# Patient Record
Sex: Male | Born: 1955 | Race: White | Hispanic: No | Marital: Married | Smoking: Current every day smoker
Health system: Southern US, Community
[De-identification: ages and names within clinical notes are randomized; demographics above are authoritative.]

## PROBLEM LIST (undated history)

## (undated) DIAGNOSIS — G8929 Other chronic pain: Secondary | ICD-10-CM

## (undated) DIAGNOSIS — I10 Essential (primary) hypertension: Secondary | ICD-10-CM

## (undated) DIAGNOSIS — J438 Other emphysema: Secondary | ICD-10-CM

## (undated) DIAGNOSIS — N528 Other male erectile dysfunction: Secondary | ICD-10-CM

## (undated) DIAGNOSIS — I700 Atherosclerosis of aorta: Secondary | ICD-10-CM

## (undated) DIAGNOSIS — E7800 Pure hypercholesterolemia, unspecified: Secondary | ICD-10-CM

## (undated) DIAGNOSIS — R7303 Prediabetes: Secondary | ICD-10-CM

## (undated) DIAGNOSIS — F17200 Nicotine dependence, unspecified, uncomplicated: Secondary | ICD-10-CM

## (undated) DIAGNOSIS — Z87448 Personal history of other diseases of urinary system: Secondary | ICD-10-CM

## (undated) HISTORY — PX: COLONOSCOPY: SHX174

## (undated) HISTORY — PX: TONSILLECTOMY: SUR1361

---

## 2011-07-29 ENCOUNTER — Ambulatory Visit: Payer: Self-pay | Admitting: Gastroenterology

## 2014-06-19 ENCOUNTER — Ambulatory Visit: Payer: Self-pay | Admitting: Family Medicine

## 2017-07-24 DIAGNOSIS — F17200 Nicotine dependence, unspecified, uncomplicated: Secondary | ICD-10-CM | POA: Insufficient documentation

## 2018-07-25 ENCOUNTER — Telehealth: Payer: Self-pay | Admitting: *Deleted

## 2018-07-25 DIAGNOSIS — R0989 Other specified symptoms and signs involving the circulatory and respiratory systems: Secondary | ICD-10-CM | POA: Insufficient documentation

## 2018-07-25 DIAGNOSIS — Z122 Encounter for screening for malignant neoplasm of respiratory organs: Secondary | ICD-10-CM

## 2018-07-25 DIAGNOSIS — Z87891 Personal history of nicotine dependence: Secondary | ICD-10-CM

## 2018-07-25 NOTE — Telephone Encounter (Signed)
Received referral for initial lung cancer screening scan. Contacted patient and obtained smoking history,(current, 86 pack year) as well as answering questions related to screening process. Patient denies signs of lung cancer such as weight loss or hemoptysis. Patient denies comorbidity that would prevent curative treatment if lung cancer were found. Patient is scheduled for shared decision making visit and CT scan on 08/14/18 at 2pm.

## 2018-07-26 ENCOUNTER — Other Ambulatory Visit: Payer: Self-pay | Admitting: Family Medicine

## 2018-07-26 DIAGNOSIS — F17200 Nicotine dependence, unspecified, uncomplicated: Secondary | ICD-10-CM

## 2018-08-02 ENCOUNTER — Ambulatory Visit
Admission: RE | Admit: 2018-08-02 | Discharge: 2018-08-02 | Disposition: A | Discharge status: Home / Self Care | Payer: BLUE CROSS/BLUE SHIELD | Source: Ambulatory Visit | Attending: Family Medicine | Admitting: Family Medicine

## 2018-08-02 DIAGNOSIS — F17200 Nicotine dependence, unspecified, uncomplicated: Secondary | ICD-10-CM

## 2018-08-13 ENCOUNTER — Telehealth: Payer: Self-pay | Admitting: *Deleted

## 2018-08-13 NOTE — Telephone Encounter (Signed)
Called pt to remind him of his appt for ldct screening on 08-14-2018@1400 , voiced understanding.

## 2018-08-14 ENCOUNTER — Encounter (INDEPENDENT_AMBULATORY_CARE_PROVIDER_SITE_OTHER): Payer: Self-pay

## 2018-08-14 ENCOUNTER — Ambulatory Visit
Admission: RE | Admit: 2018-08-14 | Discharge: 2018-08-14 | Disposition: A | Discharge status: Home / Self Care | Payer: BLUE CROSS/BLUE SHIELD | Source: Ambulatory Visit | Attending: Oncology | Admitting: Oncology

## 2018-08-14 ENCOUNTER — Inpatient Hospital Stay: Payer: BLUE CROSS/BLUE SHIELD | Attending: Oncology | Admitting: Oncology

## 2018-08-14 ENCOUNTER — Other Ambulatory Visit: Payer: Self-pay

## 2018-08-14 ENCOUNTER — Encounter: Payer: Self-pay | Admitting: Oncology

## 2018-08-14 DIAGNOSIS — Z87891 Personal history of nicotine dependence: Secondary | ICD-10-CM

## 2018-08-14 DIAGNOSIS — Z122 Encounter for screening for malignant neoplasm of respiratory organs: Secondary | ICD-10-CM | POA: Insufficient documentation

## 2018-08-14 NOTE — Progress Notes (Signed)
In accordance with CMS guidelines, patient has met eligibility criteria including age, absence of signs or symptoms of lung cancer.  Social History   Tobacco Use  . Smoking status: Current Every Day Smoker    Packs/day: 2.00    Years: 43.00    Pack years: 86.00    Types: Cigarettes  Substance Use Topics  . Alcohol use: Not on file  . Drug use: Not on file     A shared decision-making session was conducted prior to the performance of CT scan. This includes one or more decision aids, includes benefits and harms of screening, follow-up diagnostic testing, over-diagnosis, false positive rate, and total radiation exposure.  Counseling on the importance of adherence to annual lung cancer LDCT screening, impact of co-morbidities, and ability or willingness to undergo diagnosis and treatment is imperative for compliance of the program.  Counseling on the importance of continued smoking cessation for former smokers; the importance of smoking cessation for current smokers, and information about tobacco cessation interventions have been given to patient including [place] and [facility] quit [place] programs.  Written order for lung cancer screening with LDCT has been given to the patient and any and all questions have been answered to the best of my abilities.   Yearly follow up will be coordinated by Burgess Estelle, Thoracic Navigator.  Faythe Casa, NP 08/14/2018 2:49 PM

## 2018-08-15 ENCOUNTER — Telehealth: Payer: Self-pay | Admitting: *Deleted

## 2018-08-15 NOTE — Telephone Encounter (Signed)
Notified patient of LDCT lung cancer screening program results with recommendation for 12 month follow up imaging. Also notified of incidental findings noted below and is encouraged to discuss further with PCP who will receive a copy of this note and/or the CT report. Patient verbalizes understanding.   IMPRESSION: 1. Lung-RADS 2, benign appearance or behavior. Continue annual screening with low-dose chest CT without contrast in 12 months. 2. Ectatic 4.4 cm ascending thoracic aorta, which can be reassessed on follow-up screening chest CT in 12 months. 3. One vessel coronary atherosclerosis.  Aortic Atherosclerosis (ICD10-I70.0) and Emphysema (ICD10-J43.9).

## 2018-08-17 ENCOUNTER — Encounter: Payer: Self-pay | Admitting: *Deleted

## 2018-08-29 ENCOUNTER — Encounter: Payer: Self-pay | Admitting: *Deleted

## 2019-07-29 DIAGNOSIS — E7800 Pure hypercholesterolemia, unspecified: Secondary | ICD-10-CM | POA: Insufficient documentation

## 2019-07-29 DIAGNOSIS — R7303 Prediabetes: Secondary | ICD-10-CM | POA: Insufficient documentation

## 2019-08-15 ENCOUNTER — Telehealth: Payer: Self-pay | Admitting: *Deleted

## 2019-08-15 NOTE — Telephone Encounter (Signed)
(  08/15/19) Left message for pt to notify them that it is time to schedule annual low dose lung cancer screening CT scan. Instructed patient to call back to verify information prior to the scan being scheduled SRW     

## 2019-09-03 ENCOUNTER — Telehealth: Payer: Self-pay | Admitting: *Deleted

## 2019-09-03 DIAGNOSIS — Z87891 Personal history of nicotine dependence: Secondary | ICD-10-CM

## 2019-09-03 NOTE — Telephone Encounter (Signed)
(  09/03/19) Pt has been notified that lung cancer screening CT scan is due currently or will be in near future. Confirmed pt is within appropriate age range, and asymptomatic. Pt denies illness that would prevent curative treatment for lung cancer if found. Verified smoking history (Current Smoker, 2 ppd ). Pt is agreeable for CT scan being scheduled, and stated that Friday, April 2nd in the morning would be his preferred day to get the scan done on.  SRW

## 2019-09-05 NOTE — Addendum Note (Signed)
Addended by: Jonne Ply on: 09/05/2019 11:53 AM   Modules accepted: Orders

## 2019-09-05 NOTE — Telephone Encounter (Signed)
Smoking history: current, 88 pack year

## 2019-09-13 ENCOUNTER — Other Ambulatory Visit: Payer: Self-pay

## 2019-09-13 ENCOUNTER — Ambulatory Visit
Admission: RE | Admit: 2019-09-13 | Discharge: 2019-09-13 | Disposition: A | Discharge status: Home / Self Care | Payer: PRIVATE HEALTH INSURANCE | Source: Ambulatory Visit | Attending: Oncology | Admitting: Oncology

## 2019-09-13 DIAGNOSIS — Z87891 Personal history of nicotine dependence: Secondary | ICD-10-CM | POA: Insufficient documentation

## 2019-09-17 ENCOUNTER — Encounter: Payer: Self-pay | Admitting: *Deleted

## 2019-10-25 DIAGNOSIS — G8929 Other chronic pain: Secondary | ICD-10-CM | POA: Insufficient documentation

## 2020-01-29 DIAGNOSIS — I10 Essential (primary) hypertension: Secondary | ICD-10-CM | POA: Insufficient documentation

## 2020-04-13 ENCOUNTER — Encounter: Payer: Self-pay | Admitting: Orthopaedic Surgery

## 2020-04-13 ENCOUNTER — Ambulatory Visit: Payer: Self-pay

## 2020-04-13 ENCOUNTER — Ambulatory Visit: Payer: BC Managed Care – PPO | Admitting: Orthopaedic Surgery

## 2020-04-13 ENCOUNTER — Other Ambulatory Visit: Payer: Self-pay

## 2020-04-13 VITALS — Ht 70.0 in | Wt 168.4 lb

## 2020-04-13 DIAGNOSIS — G8929 Other chronic pain: Secondary | ICD-10-CM

## 2020-04-13 DIAGNOSIS — M25559 Pain in unspecified hip: Secondary | ICD-10-CM

## 2020-04-13 DIAGNOSIS — M25551 Pain in right hip: Secondary | ICD-10-CM

## 2020-04-13 DIAGNOSIS — M25572 Pain in left ankle and joints of left foot: Secondary | ICD-10-CM

## 2020-04-13 MED ORDER — METHYLPREDNISOLONE ACETATE 40 MG/ML IJ SUSP
40.0000 mg | INTRAMUSCULAR | Status: AC | PRN
  Administered 2020-04-13: 40 mg via INTRA_ARTICULAR

## 2020-04-13 MED ORDER — LIDOCAINE HCL 1 % IJ SOLN
3.0000 mL | INTRAMUSCULAR | Status: AC | PRN
  Administered 2020-04-13: 3 mL

## 2020-04-13 NOTE — Progress Notes (Signed)
Office Visit Note   Patient: Ryan Dixon           Date of Birth: July 27, 1955           MRN: [id number] Visit Date: 04/13/2020              Requested by: Marisue Ivan, MD [phone] [facility] [address] [facility] [place],  [place] [address] PCP: Marisue Ivan, MD   Assessment & Plan: Visit Diagnoses:  1. Pain in right hip   2. Hip pain   3. Pain in left ankle and joints of left foot     Plan: At this point given the failure of conservative treatment over a very long period of time, a MRI of the right hip was wanted an MRI of the left ankle.  This is to assess for tenderness and ligamentous tears over his trochanteric area on the right hip and his left medial ankle.  Since he has tried all of this conservative treatment with activity modification, therapy and injections and this has not helped, the neck step would be to obtain MRIs of both areas.  I did provide a trochanteric injection today on the right side and did show him stretching exercises to try.  All question concerns were answered and addressed.  Follow-Up Instructions: Return in about 4 weeks (around 05/11/2020).   Orders:  Orders Placed This Encounter  Procedures  . Large Joint Inj  . XR HIP UNILAT W OR W/O PELVIS 2-3 VIEWS RIGHT  . XR Ankle Complete Left   No orders of the defined types were placed in this encounter.     Procedures: Large Joint Inj: R greater trochanter on 04/13/2020 3:57 PM Indications: pain and diagnostic evaluation Details: 22 G 1.5 in needle, lateral approach  Arthrogram: No  Medications: 3 mL lidocaine 1 %; 40 mg methylPREDNISolone acetate 40 MG/ML Outcome: tolerated well, no immediate complications Procedure, treatment alternatives, risks and benefits explained, specific risks discussed. Consent was given by the patient. Immediately prior to procedure a time out was called to verify the correct patient, procedure, equipment, support staff and site/side marked as required. Patient  was prepped and draped in the usual sterile fashion.       Clinical Data: No additional findings.   Subjective: Chief Complaint  Patient presents with  . Right Hip - Pain  . Left Ankle - Pain  The patient is brand-new to me.  He comes in with chief chief complaints.  He has been dealing with right hip pain for many years now and left ankle pain.  He has been seen at [facility].  They have provided injections in his right hip joint hand and his left ankle.  The right hip is been hurting for about 4 to 5 years and the left leg was been hurting for about a year.  He feels like the left ankle may be due to how he walks different protecting his right hip.  He says injections of only temporize things and has not really helped quite a bit.  He is also been on oral prednisone and anti-inflammatories.  He is a very thin individual.  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  He climbs a lot of ladders and is down on his knees and crawling a lot.  He does have pain when he sleeps on the right hip at night.  He denies any groin pain on the right side.  He is not a diabetic and has no other acute medical  issues.  HPI  Review of Systems He currently denies any headache, chest pain, shortness of breath, fever, chills, nausea, vomiting  Objective: Vital Signs: Ht 5\' 10"  (1.778 m)   Wt 168 lb 6.4 oz (76.4 kg)   BMI 24.16 kg/m   Physical Exam He is alert and orient x3 and in no acute distress Ortho Exam Examination of his right hip shows it moves smoothly and fluidly.  There is a lot of pain over the trochanteric area of that right hip with abduction and when he squats down.  This does radiate into the IT band.  There is no sciatic component and the remainder of his right lower extremity exam is normal.  Examination of his left ankle does show pain anterior to the medial malleolus and into the ankle joint itself.  He has a lot of pain that radiates underneath his foot as well on the left side.   He is neurovascular intact with his left foot. Specialty Comments:  No specialty comments available.  Imaging: XR Ankle Complete Left  Result Date: 04/13/2020 3 views left ankle show some chronic spurring at the medial malleolus but a congruent ankle mortise.  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  XR HIP UNILAT W OR W/O PELVIS 2-3 VIEWS RIGHT  Result Date: 04/13/2020 An AP pelvis and lateral right hip shows no acute findings.  The hip joint space is well-maintained.    PMFS History: There are no problems to display for this patient.  History reviewed. No pertinent past medical history.  History reviewed. No pertinent family history.  History reviewed. No pertinent surgical history. Social History   Occupational History  . Not on file  Tobacco Use  . Smoking status: Current Every Day Smoker    Packs/day: 2.00    Years: 43.00    Pack years: 86.00    Types: Cigarettes  . Smokeless tobacco: Never Used  Substance and Sexual Activity  . Alcohol use: Yes  . Drug use: Not on file  . Sexual activity: Not on file

## 2020-04-14 ENCOUNTER — Other Ambulatory Visit: Payer: Self-pay | Admitting: Orthopaedic Surgery

## 2020-04-14 DIAGNOSIS — Z77018 Contact with and (suspected) exposure to other hazardous metals: Secondary | ICD-10-CM

## 2020-05-04 ENCOUNTER — Other Ambulatory Visit: Payer: Self-pay

## 2020-05-04 ENCOUNTER — Ambulatory Visit
Admission: RE | Admit: 2020-05-04 | Discharge: 2020-05-04 | Disposition: A | Discharge status: Home / Self Care | Payer: BC Managed Care – PPO | Source: Ambulatory Visit | Attending: Orthopaedic Surgery | Admitting: Orthopaedic Surgery

## 2020-05-04 DIAGNOSIS — G8929 Other chronic pain: Secondary | ICD-10-CM

## 2020-05-04 DIAGNOSIS — M25551 Pain in right hip: Secondary | ICD-10-CM

## 2020-05-04 DIAGNOSIS — Z77018 Contact with and (suspected) exposure to other hazardous metals: Secondary | ICD-10-CM

## 2020-05-04 DIAGNOSIS — M25572 Pain in left ankle and joints of left foot: Secondary | ICD-10-CM

## 2020-05-11 ENCOUNTER — Ambulatory Visit: Payer: BC Managed Care – PPO | Admitting: Orthopaedic Surgery

## 2020-05-18 ENCOUNTER — Ambulatory Visit: Payer: BC Managed Care – PPO | Admitting: Orthopaedic Surgery

## 2020-05-18 ENCOUNTER — Encounter: Payer: Self-pay | Admitting: Orthopaedic Surgery

## 2020-05-18 DIAGNOSIS — G8929 Other chronic pain: Secondary | ICD-10-CM | POA: Diagnosis not present

## 2020-05-18 DIAGNOSIS — M25551 Pain in right hip: Secondary | ICD-10-CM

## 2020-05-18 DIAGNOSIS — M25572 Pain in left ankle and joints of left foot: Secondary | ICD-10-CM | POA: Diagnosis not present

## 2020-05-18 NOTE — Progress Notes (Signed)
The patient comes in today to go over MRIs of both his right hip and his left ankle.  The right hip is hurting worse and has been hurting more on the lateral aspect of his hip.  He denies any groin pain at all.  He has had an intra-articular injection in the right hip as well as trochanteric injection with steroid.  He is also had some deep left ankle pain.  Both of these were chronic and he has been through enough rest and time and conservative treatment as well as anti-inflammatories to 1 MRIs of these areas.  On my exam today his left ankle does hurt throughout his arc of motion.  There is a lot of pain medially as well.  The right hip moves smoothly and fluidly with no pain in the groin and no blocks to rotation.  There is pain over the trochanteric area.  The MRI of his right hip shows a degenerative labral tear the superior and anterior labrum but no cartilage defect in the hip ball or socket at all.  There is no muscle tears or tendon tears and no fluid collection over the trochanteric area.  The MRI of the left ankle does show tendinitis of the posterior tibial tendon and the FHL tendon.  There is also an osteochondral defect at the medial talar dome that is nonfragmented.  From a hip standpoint, I would like to send him to outpatient physical therapy because he has not had this for his hip.  From the standpoint of his left ankle.  I would like to send him to Dr. Lajoyce Corners for further evaluation of this to consider whether or not an arthroscopic intervention is warranted for that ankle.  I will defer treatment to the ankle to Dr. Lajoyce Corners.  For the right hip, I would like to see him back in 6 weeks.  All questions and concerns were answered and addressed.

## 2020-05-20 ENCOUNTER — Encounter: Payer: Self-pay | Admitting: Family

## 2020-05-20 ENCOUNTER — Ambulatory Visit: Payer: BC Managed Care – PPO | Admitting: Family

## 2020-05-20 DIAGNOSIS — M25572 Pain in left ankle and joints of left foot: Secondary | ICD-10-CM

## 2020-05-20 DIAGNOSIS — G8929 Other chronic pain: Secondary | ICD-10-CM | POA: Diagnosis not present

## 2020-05-20 DIAGNOSIS — M25872 Other specified joint disorders, left ankle and foot: Secondary | ICD-10-CM | POA: Diagnosis not present

## 2020-05-20 DIAGNOSIS — M76822 Posterior tibial tendinitis, left leg: Secondary | ICD-10-CM | POA: Diagnosis not present

## 2020-05-20 NOTE — Progress Notes (Signed)
Office Visit Note   Patient: Ryan Dixon           Date of Birth: 1955/11/27           MRN: [id number] Visit Date: 05/20/2020              Requested by: Marisue Ivan, MD [phone] [facility] [address] [facility] [place],  [place] [address] PCP: Marisue Ivan, MD  Chief Complaint  Patient presents with  . Left Ankle - Pain      HPI: Patient is a 64 year old gentleman who presents with over 1 year history of left ankle pain.  Patient states he has pain with start up and ambulation over the anterior aspect of his ankle he states he has had minimal relief with a steroid injection in the past.  Patient states that after prolonged ambulation he has pain over the posterior tibial tendon.  Patient is status post MRI scan.  Assessment & Plan: Visit Diagnoses: No diagnosis found.  Plan: Patient was given instructions and demonstrated fascial strengthening for his posterior tibial tendinitis.  Discussed that we could try a brace if this does not help.  Discussed that arthroscopic intervention is an option for the osteochondral defect in the impingement of the left ankle risks and benefits were discussed including infection persistent pain need for additional surgery.  Patient states he understands wished to proceed with outpatient arthroscopic debridement of his left ankle.  Follow-Up Instructions: Return if symptoms worsen or fail to improve.   Ortho Exam  Patient is alert, oriented, no adenopathy, well-dressed, normal affect, normal respiratory effort. Examination patient has a good pulse he can do a single limb heel raise but he does have some tenderness to palpation of the posterior tibial tendon he is tender to palpation anteriorly over the ankle there is no effusion there is good range of motion of the ankle and subtalar joint.  MRI scan shows inflammation of the posterior tibial tendon as well as osteochondral defect of the ankle.  Imaging: No results found. No images are  attached to the encounter.  Labs: No results found for: HGBA1C, ESRSEDRATE, CRP, LABURIC, REPTSTATUS, GRAMSTAIN, CULT, LABORGA   No results found for: ALBUMIN, PREALBUMIN, LABURIC  No results found for: MG No results found for: VD25OH  No results found for: PREALBUMIN No flowsheet data found.   There is no height or weight on file to calculate BMI.  Orders:  No orders of the defined types were placed in this encounter.  No orders of the defined types were placed in this encounter.    Procedures: No procedures performed  Clinical Data: No additional findings.  ROS:  All other systems negative, except as noted in the HPI. Review of Systems  Objective: Vital Signs: There were no vitals taken for this visit.  Specialty Comments:  No specialty comments available.  PMFS History: There are no problems to display for this patient.  History reviewed. No pertinent past medical history.  History reviewed. No pertinent family history.  History reviewed. No pertinent surgical history. Social History   Occupational History  . Not on file  Tobacco Use  . Smoking status: Current Every Day Smoker    Packs/day: 2.00    Years: 43.00    Pack years: 86.00    Types: Cigarettes  . Smokeless tobacco: Never Used  Substance and Sexual Activity  . Alcohol use: Yes  . Drug use: Not on file  . Sexual activity: Not on file

## 2020-06-29 ENCOUNTER — Ambulatory Visit: Payer: BC Managed Care – PPO | Admitting: Orthopaedic Surgery

## 2020-07-27 ENCOUNTER — Other Ambulatory Visit: Payer: Self-pay | Admitting: Physician Assistant

## 2020-07-30 ENCOUNTER — Other Ambulatory Visit: Payer: Self-pay

## 2020-08-04 ENCOUNTER — Other Ambulatory Visit: Payer: Self-pay

## 2020-08-04 ENCOUNTER — Encounter (HOSPITAL_BASED_OUTPATIENT_CLINIC_OR_DEPARTMENT_OTHER): Payer: Self-pay | Admitting: Orthopedic Surgery

## 2020-08-07 ENCOUNTER — Other Ambulatory Visit (HOSPITAL_COMMUNITY)
Admission: RE | Admit: 2020-08-07 | Discharge: 2020-08-07 | Disposition: A | Discharge status: Home / Self Care | Payer: BC Managed Care – PPO | Source: Ambulatory Visit | Attending: Orthopedic Surgery | Admitting: Orthopedic Surgery

## 2020-08-07 ENCOUNTER — Encounter (HOSPITAL_BASED_OUTPATIENT_CLINIC_OR_DEPARTMENT_OTHER)
Admission: RE | Admit: 2020-08-07 | Discharge: 2020-08-07 | Disposition: A | Discharge status: Home / Self Care | Payer: BC Managed Care – PPO | Source: Ambulatory Visit | Attending: Orthopedic Surgery | Admitting: Orthopedic Surgery

## 2020-08-07 DIAGNOSIS — Z01812 Encounter for preprocedural laboratory examination: Secondary | ICD-10-CM | POA: Diagnosis not present

## 2020-08-07 DIAGNOSIS — I10 Essential (primary) hypertension: Secondary | ICD-10-CM | POA: Diagnosis not present

## 2020-08-07 DIAGNOSIS — Z20822 Contact with and (suspected) exposure to covid-19: Secondary | ICD-10-CM | POA: Insufficient documentation

## 2020-08-07 DIAGNOSIS — Z01818 Encounter for other preprocedural examination: Secondary | ICD-10-CM | POA: Diagnosis not present

## 2020-08-07 LAB — BASIC METABOLIC PANEL
Anion gap: 11 (ref 5–15)
BUN: 22 mg/dL (ref 8–23)
CO2: 28 mmol/L (ref 22–32)
Calcium: 9 mg/dL (ref 8.9–10.3)
Chloride: 100 mmol/L (ref 98–111)
Creatinine, Ser: 1.24 mg/dL (ref 0.61–1.24)
GFR, Estimated: >60 mL/min (ref >60)
Glucose, Bld: 106 mg/dL — ABNORMAL HIGH (ref 70–99)
Potassium: 4.1 mmol/L (ref 3.5–5.1)
Sodium: 139 mmol/L (ref 135–145)

## 2020-08-07 LAB — SARS CORONAVIRUS 2 (TAT 6-24 HRS): SARS Coronavirus 2: NEGATIVE

## 2020-08-07 NOTE — Progress Notes (Signed)

## 2020-08-10 NOTE — Anesthesia Preprocedure Evaluation (Addendum)
Anesthesia Evaluation  Patient identified by MRN, date of birth, ID band Patient awake    Reviewed: Allergy & Precautions, NPO status , Patient's Chart, lab work & pertinent test results  Airway Mallampati: II  TM Distance: >3 FB Neck ROM: Full    Dental no notable dental hx. (+) Partial Upper, Partial Lower, Dental Advisory Given   Pulmonary Current SmokerPatient did not abstain from smoking.,    Pulmonary exam normal breath sounds clear to auscultation       Cardiovascular Exercise Tolerance: Good hypertension, Pt. on medications Normal cardiovascular exam Rhythm:Regular Rate:Normal     Neuro/Psych negative neurological ROS  negative psych ROS   GI/Hepatic negative GI ROS, Neg liver ROS,   Endo/Other  negative endocrine ROS  Renal/GU negative Renal ROS     Musculoskeletal negative musculoskeletal ROS (+)   Abdominal   Peds  Hematology negative hematology ROS (+)   Anesthesia Other Findings   Reproductive/Obstetrics                            Anesthesia Physical Anesthesia Plan  ASA: II  Anesthesia Plan: General   Post-op Pain Management:    Induction: Intravenous  PONV Risk Score and Plan: 2 and Treatment may vary due to age or medical condition  Airway Management Planned: LMA  Additional Equipment: None  Intra-op Plan:   Post-operative Plan:   Informed Consent:     Dental advisory given  Plan Discussed with: CRNA and Anesthesiologist  Anesthesia Plan Comments: (GA )       Anesthesia Quick Evaluation

## 2020-08-11 ENCOUNTER — Ambulatory Visit (HOSPITAL_BASED_OUTPATIENT_CLINIC_OR_DEPARTMENT_OTHER): Payer: BC Managed Care – PPO | Admitting: Anesthesiology

## 2020-08-11 ENCOUNTER — Encounter (HOSPITAL_BASED_OUTPATIENT_CLINIC_OR_DEPARTMENT_OTHER): Payer: Self-pay | Admitting: Orthopedic Surgery

## 2020-08-11 ENCOUNTER — Other Ambulatory Visit: Payer: Self-pay

## 2020-08-11 ENCOUNTER — Ambulatory Visit (HOSPITAL_BASED_OUTPATIENT_CLINIC_OR_DEPARTMENT_OTHER)
Admission: RE | Admit: 2020-08-11 | Discharge: 2020-08-11 | Disposition: A | Discharge status: Home / Self Care | Payer: BC Managed Care – PPO | Attending: Orthopedic Surgery | Admitting: Orthopedic Surgery

## 2020-08-11 ENCOUNTER — Encounter (HOSPITAL_BASED_OUTPATIENT_CLINIC_OR_DEPARTMENT_OTHER)
Admission: RE | Disposition: A | Discharge status: Home / Self Care | Payer: Self-pay | Source: Home / Self Care | Attending: Orthopedic Surgery

## 2020-08-11 DIAGNOSIS — Z886 Allergy status to analgesic agent status: Secondary | ICD-10-CM | POA: Insufficient documentation

## 2020-08-11 DIAGNOSIS — M659 Synovitis and tenosynovitis, unspecified: Secondary | ICD-10-CM | POA: Insufficient documentation

## 2020-08-11 DIAGNOSIS — Z79899 Other long term (current) drug therapy: Secondary | ICD-10-CM | POA: Insufficient documentation

## 2020-08-11 DIAGNOSIS — M25872 Other specified joint disorders, left ankle and foot: Secondary | ICD-10-CM | POA: Diagnosis present

## 2020-08-11 DIAGNOSIS — Z791 Long term (current) use of non-steroidal anti-inflammatories (NSAID): Secondary | ICD-10-CM | POA: Insufficient documentation

## 2020-08-11 DIAGNOSIS — F17210 Nicotine dependence, cigarettes, uncomplicated: Secondary | ICD-10-CM | POA: Diagnosis not present

## 2020-08-11 HISTORY — PX: ANKLE ARTHROSCOPY: SHX545

## 2020-08-11 HISTORY — DX: Essential (primary) hypertension: I10

## 2020-08-11 SURGERY — ARTHROSCOPY, ANKLE
Anesthesia: General | Site: Ankle | Laterality: Left

## 2020-08-11 MED ORDER — OXYCODONE HCL 5 MG PO TABS: 5.0000 mg | ORAL_TABLET | Freq: Once | ORAL | Status: DC | PRN

## 2020-08-11 MED ORDER — ONDANSETRON HCL 4 MG/2ML IJ SOLN
INTRAMUSCULAR | Status: AC
  Filled 2020-08-11: qty 2

## 2020-08-11 MED ORDER — FENTANYL CITRATE (PF) 100 MCG/2ML IJ SOLN
INTRAMUSCULAR | Status: AC
  Filled 2020-08-11: qty 2

## 2020-08-11 MED ORDER — PROPOFOL 10 MG/ML IV BOLUS
INTRAVENOUS | Status: AC
  Filled 2020-08-11: qty 40

## 2020-08-11 MED ORDER — ONDANSETRON HCL 4 MG/2ML IJ SOLN
INTRAMUSCULAR | Status: DC | PRN
  Administered 2020-08-11: 4 mg via INTRAVENOUS

## 2020-08-11 MED ORDER — BUPIVACAINE HCL (PF) 0.25 % IJ SOLN
INTRAMUSCULAR | Status: AC
  Filled 2020-08-11: qty 30

## 2020-08-11 MED ORDER — PROPOFOL 10 MG/ML IV BOLUS
INTRAVENOUS | Status: DC | PRN
  Administered 2020-08-11: 120 mg via INTRAVENOUS
  Administered 2020-08-11: 20 mg via INTRAVENOUS
  Administered 2020-08-11: 60 mg via INTRAVENOUS

## 2020-08-11 MED ORDER — CEFAZOLIN SODIUM-DEXTROSE 2-4 GM/100ML-% IV SOLN
INTRAVENOUS | Status: AC
  Filled 2020-08-11: qty 100

## 2020-08-11 MED ORDER — OXYCODONE HCL 5 MG/5ML PO SOLN: 5.0000 mg | Freq: Once | ORAL | Status: DC | PRN

## 2020-08-11 MED ORDER — MIDAZOLAM HCL 5 MG/5ML IJ SOLN
INTRAMUSCULAR | Status: DC | PRN
  Administered 2020-08-11: 2 mg via INTRAVENOUS

## 2020-08-11 MED ORDER — FENTANYL CITRATE (PF) 100 MCG/2ML IJ SOLN
INTRAMUSCULAR | Status: DC | PRN
  Administered 2020-08-11: 50 ug via INTRAVENOUS
  Administered 2020-08-11 (×2): 25 ug via INTRAVENOUS
  Administered 2020-08-11: 50 ug via INTRAVENOUS

## 2020-08-11 MED ORDER — ACETAMINOPHEN 10 MG/ML IV SOLN: 1000.0000 mg | Freq: Once | INTRAVENOUS | Status: DC | PRN

## 2020-08-11 MED ORDER — AMISULPRIDE (ANTIEMETIC) 5 MG/2ML IV SOLN: 10.0000 mg | Freq: Once | INTRAVENOUS | Status: DC | PRN

## 2020-08-11 MED ORDER — DEXAMETHASONE SODIUM PHOSPHATE 4 MG/ML IJ SOLN
INTRAMUSCULAR | Status: DC | PRN
  Administered 2020-08-11: 8 mg via INTRAVENOUS

## 2020-08-11 MED ORDER — MIDAZOLAM HCL 2 MG/2ML IJ SOLN
INTRAMUSCULAR | Status: AC
  Filled 2020-08-11: qty 2

## 2020-08-11 MED ORDER — PROPOFOL 10 MG/ML IV BOLUS
INTRAVENOUS | Status: AC
  Filled 2020-08-11: qty 20

## 2020-08-11 MED ORDER — LACTATED RINGERS IV SOLN: INTRAVENOUS | Status: DC

## 2020-08-11 MED ORDER — DEXAMETHASONE SODIUM PHOSPHATE 10 MG/ML IJ SOLN
INTRAMUSCULAR | Status: AC
  Filled 2020-08-11: qty 1

## 2020-08-11 MED ORDER — HYDROCODONE-ACETAMINOPHEN 5-325 MG PO TABS: 1.0000 | ORAL_TABLET | ORAL | 0 refills | Status: DC | PRN

## 2020-08-11 MED ORDER — HYDROMORPHONE HCL 1 MG/ML IJ SOLN: 0.2500 mg | INTRAMUSCULAR | Status: DC | PRN

## 2020-08-11 MED ORDER — CEFAZOLIN SODIUM-DEXTROSE 2-4 GM/100ML-% IV SOLN
2.0000 g | INTRAVENOUS | Status: AC
  Administered 2020-08-11: 2 g via INTRAVENOUS

## 2020-08-11 MED ORDER — BUPIVACAINE HCL (PF) 0.25 % IJ SOLN
INTRAMUSCULAR | Status: DC | PRN
  Administered 2020-08-11: 15 mL

## 2020-08-11 MED ORDER — LIDOCAINE 2% (20 MG/ML) 5 ML SYRINGE
INTRAMUSCULAR | Status: DC | PRN
  Administered 2020-08-11: 100 mg via INTRAVENOUS

## 2020-08-11 MED ORDER — ONDANSETRON HCL 4 MG/2ML IJ SOLN: 4.0000 mg | Freq: Once | INTRAMUSCULAR | Status: DC | PRN

## 2020-08-11 SURGICAL SUPPLY — 34 items
BNDG CMPR 9X4 STRL LF SNTH (GAUZE/BANDAGES/DRESSINGS)
BNDG COHESIVE 4X5 TAN STRL (GAUZE/BANDAGES/DRESSINGS) IMPLANT
BNDG COHESIVE 6X5 TAN STRL LF (GAUZE/BANDAGES/DRESSINGS) ×2 IMPLANT
BNDG ESMARK 4X9 LF (GAUZE/BANDAGES/DRESSINGS) IMPLANT
BNDG GAUZE ELAST 4 BULKY (GAUZE/BANDAGES/DRESSINGS) IMPLANT
COVER WAND RF STERILE (DRAPES) IMPLANT
DISSECTOR 4.0MM X 13CM (MISCELLANEOUS) ×2 IMPLANT
DRAPE ARTHROSCOPY W/POUCH 90 (DRAPES) ×2 IMPLANT
DRAPE OEC MINIVIEW 54X84 (DRAPES) IMPLANT
DRAPE U-SHAPE 47X51 STRL (DRAPES) ×2 IMPLANT
DRSG EMULSION OIL 3X3 NADH (GAUZE/BANDAGES/DRESSINGS) ×2 IMPLANT
DURAPREP 26ML APPLICATOR (WOUND CARE) ×2 IMPLANT
EXCALIBUR 3.8MM X 13CM (MISCELLANEOUS) ×2 IMPLANT
GAUZE SPONGE 4X4 12PLY STRL (GAUZE/BANDAGES/DRESSINGS) ×2 IMPLANT
GLOVE SURG ENC MOIS LTX SZ7 (GLOVE) ×4 IMPLANT
GLOVE SURG ORTHO LTX SZ9 (GLOVE) ×2 IMPLANT
GLOVE SURG UNDER POLY LF SZ7.5 (GLOVE) ×4 IMPLANT
GLOVE SURG UNDER POLY LF SZ9 (GLOVE) ×2 IMPLANT
GOWN STRL REUS W/ TWL LRG LVL3 (GOWN DISPOSABLE) ×1 IMPLANT
GOWN STRL REUS W/ TWL XL LVL3 (GOWN DISPOSABLE) ×1 IMPLANT
GOWN STRL REUS W/TWL LRG LVL3 (GOWN DISPOSABLE) ×2
GOWN STRL REUS W/TWL XL LVL3 (GOWN DISPOSABLE) ×2
MANIFOLD NEPTUNE II (INSTRUMENTS) IMPLANT
PACK ARTHROSCOPY DSU (CUSTOM PROCEDURE TRAY) ×2 IMPLANT
PACK BASIN DAY SURGERY FS (CUSTOM PROCEDURE TRAY) ×2 IMPLANT
PORT APPOLLO RF 90DEGREE MULTI (SURGICAL WAND) IMPLANT
PROBE APOLLO 90XL (SURGICAL WAND) ×2 IMPLANT
SLEEVE SCD COMPRESS KNEE MED (STOCKING) IMPLANT
STRAP ANKLE FOOT DISTRACTOR (ORTHOPEDIC SUPPLIES) ×2 IMPLANT
SUT ETHILON 2 0 FSLX (SUTURE) ×2 IMPLANT
SYR 5ML LL (SYRINGE) IMPLANT
TOWEL GREEN STERILE FF (TOWEL DISPOSABLE) ×2 IMPLANT
TUBING ARTHROSCOPY IRRIG 16FT (MISCELLANEOUS) ×2 IMPLANT
WATER STERILE IRR 1000ML POUR (IV SOLUTION) ×2 IMPLANT

## 2020-08-11 NOTE — Op Note (Signed)
08/11/2020  8:07 AM  PATIENT:  Ryan Dixon    PRE-OPERATIVE DIAGNOSIS:  Impingement Left Ankle  POST-OPERATIVE DIAGNOSIS:  Same  PROCEDURE:  LEFT ANKLE ARTHROSCOPY AND DEBRIDEMENT  SURGEON:  Nadara Mustard, MD  PHYSICIAN ASSISTANT:None ANESTHESIA:   General  PREOPERATIVE INDICATIONS:  [name] is a  65 y.o. male with a diagnosis of Impingement Left Ankle who failed conservative measures and elected for surgical management.    The risks benefits and alternatives were discussed with the patient preoperatively including but not limited to the risks of infection, bleeding, nerve injury, cardiopulmonary complications, the need for revision surgery, among others, and the patient was willing to proceed.  OPERATIVE IMPLANTS: None  @ENCIMAGES @  OPERATIVE FINDINGS: Inflammatory cellulitis resected articular cartilage without osteochondral defect  OPERATIVE PROCEDURE: Patient brought the operating room and underwent a general anesthetic.  After adequate levels anesthesia were obtained patient's left lower extremity was prepped using DuraPrep draped into a sterile field a timeout was called.  The scope was inserted to the anterior medial portal and an anterior lateral working portal was established.  The skin was incised blunt dissection was carried down to the capsule and a blunt trocar was used inserted into the joint.  Visualization showed bleeding with inflammatory synovitis.  The inflammatory synovitis was resected electrocautery was used for hemostasis.  The tibial talar articular cartilage was probed there was no palpable osteochondral defects.  The medial and lateral gutters were cleansed.  Survey of all surfaces was again performed there were no loose bodies no impingement.  The instruments were removed the portals were closed using 2-0 nylon the joint was infused with 15 cc of quarter percent Marcaine plain a sterile dressing was applied patient was extubated taken the PACU in stable  condition   DISCHARGE PLANNING:  Antibiotic duration: Preoperative antibiotics  Weightbearing: Weightbearing as tolerated  Pain medication: Prescription for Vicodin  Dressing care/ Wound VAC: Discontinue dressing in 2 days  Ambulatory devices: Crutches  Discharge to: Home.  Follow-up: In the office 1 week post operative.

## 2020-08-11 NOTE — Anesthesia Postprocedure Evaluation (Signed)
Anesthesia Post Note  Patient: Ryan Dixon  Procedure(s) Performed: LEFT ANKLE ARTHROSCOPY AND DEBRIDEMENT (Left Ankle)     Patient location during evaluation: PACU Anesthesia Type: General Level of consciousness: awake and alert Pain management: pain level controlled Vital Signs Assessment: post-procedure vital signs reviewed and stable Respiratory status: spontaneous breathing, nonlabored ventilation, respiratory function stable and patient connected to nasal cannula oxygen Cardiovascular status: blood pressure returned to baseline and stable Postop Assessment: no apparent nausea or vomiting Anesthetic complications: no   No complications documented.  Last Vitals:  Vitals:   08/11/20 0830 08/11/20 0843  BP: (!) 152/98 (!) 154/89  Pulse: 75 76  Resp: 19 20  Temp:  36.4 C  SpO2: 99% 100%    Last Pain:  Vitals:   08/11/20 0843  TempSrc: Oral  PainSc: 0-No pain                 Trevor Iha

## 2020-08-11 NOTE — Anesthesia Procedure Notes (Signed)
Procedure Name: LMA Insertion Date/Time: 08/11/2020 7:33 AM Performed by: Alford Highland, CRNA Pre-anesthesia Checklist: Patient identified, Emergency Drugs available, Suction available and Patient being monitored Patient Re-evaluated:Patient Re-evaluated prior to induction Oxygen Delivery Method: Circle System Utilized Preoxygenation: Pre-oxygenation with 100% oxygen Induction Type: IV induction Ventilation: Mask ventilation without difficulty LMA: LMA inserted LMA Size: 5.0 Number of attempts: 1 Airway Equipment and Method: Bite block Placement Confirmation: positive ETCO2 Tube secured with: Tape Dental Injury: Teeth and Oropharynx as per pre-operative assessment  Comments: Eyes taped prior to insertion

## 2020-08-11 NOTE — Discharge Instructions (Signed)

## 2020-08-11 NOTE — Transfer of Care (Signed)
Immediate Anesthesia Transfer of Care Note  Patient: Ryan Dixon  Procedure(s) Performed: LEFT ANKLE ARTHROSCOPY AND DEBRIDEMENT (Left Ankle)  Patient Location: PACU  Anesthesia Type:General  Level of Consciousness: drowsy  Airway & Oxygen Therapy: Patient Spontanous Breathing and Patient connected to face mask oxygen  Post-op Assessment: Report given to RN and Post -op Vital signs reviewed and stable  Post vital signs: Reviewed and stable  Last Vitals:  Vitals Value Taken Time  BP    Temp    Pulse 81 08/11/20 0811  Resp 0 08/11/20 0811  SpO2 100 % 08/11/20 0811  Vitals shown include unvalidated device data.  Last Pain:  Vitals:   08/11/20 0637  TempSrc: Oral  PainSc: 3       Patients Stated Pain Goal: 2 (08/11/20 3295)  Complications: No complications documented.

## 2020-08-11 NOTE — H&P (Signed)
Ryan Dixon is an 65 y.o. male.   Chief Complaint: Left Ankle Impingement HPI: Patient is a 65 year old gentleman who presents with over 1 year history of left ankle pain.  Patient states he has pain with start up and ambulation over the anterior aspect of his ankle he states he has had minimal relief with a steroid injection in the past.  Patient states that after prolonged ambulation he has pain over the posterior tibial tendon.  Patient is status post MRI scan.  Past Medical History:  Diagnosis Date  . Hypertension     Past Surgical History:  Procedure Laterality Date  . TONSILLECTOMY      History reviewed. No pertinent family history. Social History:  reports that he has been smoking cigarettes. He has a 86.00 pack-year smoking history. He has never used smokeless tobacco. He reports current alcohol use. He reports that he does not use drugs.  Allergies:  Allergies  Allergen Reactions  . Other Other (See Comments)    Anti-inflammatories cause GI upset, N/V    Medications Prior to Admission  Medication Sig Dispense Refill  . hydrochlorothiazide (HYDRODIURIL) 12.5 MG tablet Take 12.5 mg by mouth daily.    . meloxicam (MOBIC) 7.5 MG tablet Take 7.5 mg by mouth daily.      No results found for this or any previous visit (from the past 48 hour(s)). No results found.  Review of Systems  All other systems reviewed and are negative.   Height 5\' 10"  (1.778 m), weight 77.1 kg. Physical Exam   Patient is alert, oriented, no adenopathy, well-dressed, normal affect, normal respiratory effort. Examination patient has a good pulse he can do a single limb heel raise but he does have some tenderness to palpation of the posterior tibial tendon he is tender to palpation anteriorly over the ankle there is no effusion there is good range of motion of the ankle and subtalar joint.  MRI scan shows inflammation of the posterior tibial tendon as well as osteochondral defect of the ankle. RRR  Lungs Clear Assessment/Plan Plan: Patient was given instructions and demonstrated fascial strengthening for his posterior tibial tendinitis.  Discussed that we could try a brace if this does not help.  Discussed that arthroscopic intervention is an option for the osteochondral defect in the impingement of the left ankle risks and benefits were discussed including infection persistent pain need for additional surgery.  Patient states he understands wished to proceed with outpatient arthroscopic debridement of his left ankle.  Dagon Budai, PA 08/11/2020, 6:38 AM

## 2020-08-11 NOTE — Interval H&P Note (Signed)
History and Physical Interval Note:  08/11/2020 6:45 AM  Ryan Dixon  has presented today for surgery, with the diagnosis of Impingement Left Ankle.  The various methods of treatment have been discussed with the patient and family. After consideration of risks, benefits and other options for treatment, the patient has consented to  Procedure(s): LEFT ANKLE ARTHROSCOPY AND DEBRIDEMENT (Left) as a surgical intervention.  The patient's history has been reviewed, patient examined, no change in status, stable for surgery.  I have reviewed the patient's chart and labs.  Questions were answered to the patient's satisfaction.     Nadara Mustard

## 2020-08-12 ENCOUNTER — Encounter (HOSPITAL_BASED_OUTPATIENT_CLINIC_OR_DEPARTMENT_OTHER): Payer: Self-pay | Admitting: Orthopedic Surgery

## 2020-08-18 ENCOUNTER — Inpatient Hospital Stay: Payer: BC Managed Care – PPO | Admitting: Physician Assistant

## 2020-08-18 ENCOUNTER — Ambulatory Visit (INDEPENDENT_AMBULATORY_CARE_PROVIDER_SITE_OTHER): Payer: BC Managed Care – PPO | Admitting: Family

## 2020-08-18 ENCOUNTER — Encounter: Payer: Self-pay | Admitting: Family

## 2020-08-18 DIAGNOSIS — M25872 Other specified joint disorders, left ankle and foot: Secondary | ICD-10-CM

## 2020-08-18 NOTE — Progress Notes (Signed)
   Post-Op Visit Note   Patient: Ryan Dixon           Date of Birth: February 27, 1956           MRN: [id number] Visit Date: 08/18/2020 PCP: Marisue Ivan, MD  Chief Complaint:  Chief Complaint  Patient presents with  . Left Ankle - Routine Post Op    08/11/20 left ankle scope and debridement     HPI:  HPI The patient is a 65 year old gentleman seen status post left ankle arthroscopy and debridement on March 1 he is full weightbearing in a postop shoe has no concerns today.  He is pleased with his improvement in pain  Ortho Exam  The portals are clean and dry his sutures were harvested today without incident.  There is no erythema no edema of his foot or ankle  Visit Diagnoses: No diagnosis found.  Plan: He will advance his weightbearing as tolerated.  Keep the portals clean and dry  Follow-Up Instructions: Return in about 3 weeks (around 09/08/2020), or if symptoms worsen or fail to improve.   Imaging: No results found.  Orders:  No orders of the defined types were placed in this encounter.  No orders of the defined types were placed in this encounter.    PMFS History: Patient Active Problem List   Diagnosis Date Noted  . Ankle impingement syndrome, left    Past Medical History:  Diagnosis Date  . Hypertension     History reviewed. No pertinent family history.  Past Surgical History:  Procedure Laterality Date  . ANKLE ARTHROSCOPY Left 08/11/2020   Procedure: LEFT ANKLE ARTHROSCOPY AND DEBRIDEMENT;  Surgeon: Nadara Mustard, MD;  Location:  SUR[facility]ERY [facility];  Service: Orthopedics;  Laterality: Left;  . TONSILLECTOMY     Social History   Occupational History  . Not on file  Tobacco Use  . Smoking status: Current Every Day Smoker    Packs/day: 2.00    Years: 43.00    Pack years: 86.00    Types: Cigarettes  . Smokeless tobacco: Never Used  Substance and Sexual Activity  . Alcohol use: Yes    Comment: 1-2 beers/day  . Drug use: Never  . Sexual  activity: Not on file

## 2020-08-23 ENCOUNTER — Telehealth: Payer: Self-pay | Admitting: *Deleted

## 2020-08-23 NOTE — Telephone Encounter (Signed)
Notified patient that it is time to schedule his lung cancer screening CT scan. Patient currently smokes 2 packs of cigarettes per day. Appointment scheduled for 09/16/2020 at 11:30 am.

## 2020-08-24 ENCOUNTER — Other Ambulatory Visit: Payer: Self-pay | Admitting: *Deleted

## 2020-08-24 DIAGNOSIS — F17200 Nicotine dependence, unspecified, uncomplicated: Secondary | ICD-10-CM

## 2020-08-24 DIAGNOSIS — Z122 Encounter for screening for malignant neoplasm of respiratory organs: Secondary | ICD-10-CM

## 2020-08-24 DIAGNOSIS — Z87891 Personal history of nicotine dependence: Secondary | ICD-10-CM

## 2020-08-24 NOTE — Progress Notes (Signed)
Contacted and scheduled for annual lung screening scan. Patient is a current smoker with a 90 pack year history.

## 2020-09-16 ENCOUNTER — Other Ambulatory Visit: Payer: Self-pay

## 2020-09-16 ENCOUNTER — Ambulatory Visit
Admission: RE | Admit: 2020-09-16 | Discharge: 2020-09-16 | Disposition: A | Discharge status: Home / Self Care | Payer: BC Managed Care – PPO | Source: Ambulatory Visit | Attending: Oncology | Admitting: Oncology

## 2020-09-16 DIAGNOSIS — F17200 Nicotine dependence, unspecified, uncomplicated: Secondary | ICD-10-CM | POA: Insufficient documentation

## 2020-09-16 DIAGNOSIS — Z87891 Personal history of nicotine dependence: Secondary | ICD-10-CM | POA: Insufficient documentation

## 2020-09-16 DIAGNOSIS — Z122 Encounter for screening for malignant neoplasm of respiratory organs: Secondary | ICD-10-CM | POA: Insufficient documentation

## 2020-09-23 ENCOUNTER — Encounter: Payer: Self-pay | Admitting: *Deleted

## 2021-07-29 DIAGNOSIS — N528 Other male erectile dysfunction: Secondary | ICD-10-CM | POA: Insufficient documentation

## 2021-10-04 ENCOUNTER — Other Ambulatory Visit: Payer: Self-pay | Admitting: Family Medicine

## 2021-10-12 ENCOUNTER — Other Ambulatory Visit: Payer: Self-pay | Admitting: Family Medicine

## 2021-10-12 DIAGNOSIS — M5416 Radiculopathy, lumbar region: Secondary | ICD-10-CM

## 2021-10-26 ENCOUNTER — Ambulatory Visit
Admission: RE | Admit: 2021-10-26 | Discharge: 2021-10-26 | Disposition: A | Discharge status: Home / Self Care | Payer: BC Managed Care – PPO | Source: Ambulatory Visit | Attending: Family Medicine | Admitting: Family Medicine

## 2021-10-26 DIAGNOSIS — M5416 Radiculopathy, lumbar region: Secondary | ICD-10-CM

## 2021-11-04 ENCOUNTER — Telehealth: Payer: Self-pay | Admitting: Acute Care

## 2021-11-04 ENCOUNTER — Other Ambulatory Visit: Payer: Self-pay

## 2021-11-04 DIAGNOSIS — F17200 Nicotine dependence, unspecified, uncomplicated: Secondary | ICD-10-CM

## 2021-11-04 DIAGNOSIS — Z122 Encounter for screening for malignant neoplasm of respiratory organs: Secondary | ICD-10-CM

## 2021-11-04 DIAGNOSIS — Z87891 Personal history of nicotine dependence: Secondary | ICD-10-CM

## 2021-11-04 NOTE — Telephone Encounter (Signed)
Attempted to reach pt to schedule annual LDCT-LVMM 

## 2021-11-18 ENCOUNTER — Ambulatory Visit
Admission: RE | Admit: 2021-11-18 | Discharge: 2021-11-18 | Disposition: A | Discharge status: Home / Self Care | Payer: BC Managed Care – PPO | Source: Ambulatory Visit | Attending: Acute Care | Admitting: Acute Care

## 2021-11-18 DIAGNOSIS — Z122 Encounter for screening for malignant neoplasm of respiratory organs: Secondary | ICD-10-CM | POA: Diagnosis present

## 2021-11-18 DIAGNOSIS — F17200 Nicotine dependence, unspecified, uncomplicated: Secondary | ICD-10-CM | POA: Diagnosis present

## 2021-11-18 DIAGNOSIS — Z87891 Personal history of nicotine dependence: Secondary | ICD-10-CM | POA: Diagnosis present

## 2021-11-22 ENCOUNTER — Other Ambulatory Visit: Payer: Self-pay | Admitting: Acute Care

## 2021-11-22 DIAGNOSIS — F17210 Nicotine dependence, cigarettes, uncomplicated: Secondary | ICD-10-CM

## 2021-11-22 DIAGNOSIS — Z122 Encounter for screening for malignant neoplasm of respiratory organs: Secondary | ICD-10-CM

## 2021-11-22 DIAGNOSIS — Z87891 Personal history of nicotine dependence: Secondary | ICD-10-CM

## 2021-11-23 DIAGNOSIS — J439 Emphysema, unspecified: Secondary | ICD-10-CM | POA: Insufficient documentation

## 2021-11-23 DIAGNOSIS — I700 Atherosclerosis of aorta: Secondary | ICD-10-CM | POA: Insufficient documentation

## 2022-09-13 ENCOUNTER — Ambulatory Visit: Payer: Self-pay | Admitting: Urology

## 2022-09-15 ENCOUNTER — Ambulatory Visit: Payer: Self-pay | Admitting: Urology

## 2022-09-21 ENCOUNTER — Encounter: Payer: Self-pay | Admitting: Urology

## 2022-09-21 ENCOUNTER — Ambulatory Visit: Payer: BC Managed Care – PPO | Admitting: Urology

## 2022-09-21 VITALS — BP 135/82 | HR 98 | Ht 70.0 in | Wt 167.0 lb

## 2022-09-21 DIAGNOSIS — R310 Gross hematuria: Secondary | ICD-10-CM

## 2022-09-21 LAB — URINALYSIS, COMPLETE
Bilirubin, UA: NEGATIVE
Glucose, UA: NEGATIVE
Ketones, UA: NEGATIVE
Leukocytes,UA: NEGATIVE
Nitrite, UA: NEGATIVE
Specific Gravity, UA: 1.015 (ref 1.005–1.030)
Urobilinogen, Ur: 0.2 mg/dL (ref 0.2–1.0)
pH, UA: 5.5 (ref 5.0–7.5)

## 2022-09-21 LAB — MICROSCOPIC EXAMINATION: RBC, Urine: >30 /hpf — AB (ref 0–2)

## 2022-09-21 NOTE — Progress Notes (Signed)
Ryan Dixon,acting as a scribe for Ryan Altes, MD.,have documented all relevant documentation on the behalf of Ryan Altes, MD,as directed by  Ryan Altes, MD while in the presence of Ryan Altes, MD.  09/21/2022 10:35 AM   Ryan Dixon Mar 08, 1956 [id number]  Referring provider: Marisue Ivan, MD [phone] [facility] [address] [facility] [place],  [place] [address]  Chief Complaint  Patient presents with   Hematuria    HPI: Ryan Dixon is a 67 y.o. male who is referred for microhematuria.  Intermittent gross hematuria since mid-February He has noted clots at times; occasional mild dysuria, weak urinary stream and a sensation of incomplete emptying.  History of tobacco use- 2 packs a day since age 79- current smoker   PMH: Past Medical History:  Diagnosis Date   Hypertension     Surgical History: Past Surgical History:  Procedure Laterality Date   ANKLE ARTHROSCOPY Left 08/11/2020   Procedure: LEFT ANKLE ARTHROSCOPY AND DEBRIDEMENT;  Surgeon: Nadara Mustard, MD;  Location: [facility];  Service: Orthopedics;  Laterality: Left;   TONSILLECTOMY      Home Medications:  Allergies as of 09/21/2022       Reactions   Other Other (See Comments)   Anti-inflammatories cause GI upset, N/V        Medication List        Accurate as of September 21, 2022 10:35 AM. If you have any questions, ask your nurse or doctor.          atorvastatin 20 MG tablet Commonly known as: LIPITOR TAKE 1 TABLET BY MOUTH EVERY DAY AT NIGHT   hydrochlorothiazide 12.5 MG tablet Commonly known as: HYDRODIURIL Take 12.5 mg by mouth daily.   HYDROcodone-acetaminophen 5-325 MG tablet Commonly known as: NORCO/VICODIN Take 1 tablet by mouth every 4 (four) hours as needed for moderate pain.   meloxicam 7.5 MG tablet Commonly known as: MOBIC Take 7.5 mg by mouth daily.   sildenafil 50 MG tablet Commonly known as: VIAGRA Take 50 mg by mouth daily as  needed.        Allergies:  Allergies  Allergen Reactions   Other Other (See Comments)    Anti-inflammatories cause GI upset, N/V    Social History:  reports that he has been smoking cigarettes. He has a 86.00 pack-year smoking history. He has never used smokeless tobacco. He reports current alcohol use. He reports that he does not use drugs.   Physical Exam: BP 135/82   Pulse 98   Ht 5\' 10"  (1.778 m)   Wt 167 lb (75.8 kg)   BMI 23.96 kg/m   Constitutional:  Alert and oriented, No acute distress. HEENT: [place] AT Respiratory: Normal respiratory effort, no increased work of breathing. GU: Prostate 40 g, smooth without nodules Neurologic: Grossly intact, no focal deficits, moving all 4 extremities. Psychiatric: Normal mood and affect.  Laboratory Data:  Urinalysis 3+ blood/ trace protein. Microscopy shows > 30 RBC's   Assessment & Plan:    1.  Gross hematuria AUA risk stratification: High We discussed the recommended evaluation of high risk hematuria which consist of CT urogram and cystoscopy.  The procedures were discussed in detail and he/she has elected to proceed with further evaluation All questions were answered CTU order placed and cystoscopy was scheduled  I have reviewed the above documentation for accuracy and completeness, and I agree with the above.   Ryan Altes, MD  [facility]  [address], [place], [place] [address] [phone]

## 2022-10-28 ENCOUNTER — Ambulatory Visit
Admission: RE | Admit: 2022-10-28 | Discharge: 2022-10-28 | Disposition: A | Discharge status: Home / Self Care | Payer: Medicare Other | Source: Ambulatory Visit | Attending: Urology | Admitting: Urology

## 2022-10-28 DIAGNOSIS — R310 Gross hematuria: Secondary | ICD-10-CM

## 2022-10-28 MED ORDER — IOHEXOL 300 MG/ML  SOLN
125.0000 mL | Freq: Once | INTRAMUSCULAR | Status: AC | PRN
  Administered 2022-10-28: 100 mL via INTRAVENOUS

## 2022-11-03 IMAGING — CT CT CHEST LUNG CANCER SCREENING LOW DOSE W/O CM
2 of 5 series · 15 of 40 positions shown, 18 images · non-contrast
Comparison: 09/16/2020

CLINICAL DATA: 66-year-old male with 91 pack-year history of
smoking. Lung cancer screening.



[Series 3: lung 1.00 · axial · 0.69mm/px · z∈[-1185,-875]mm · 12 of 342 slices shown, 15 images]
[im 16/342  mediastinal]
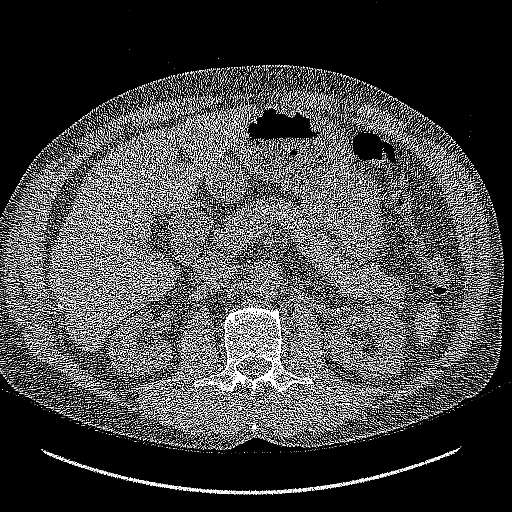
[im 16/342  lung]
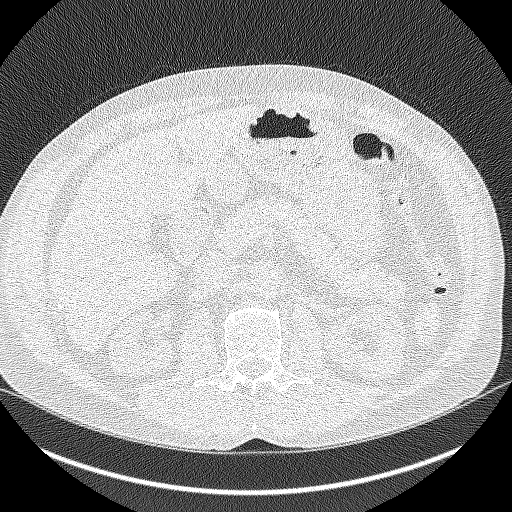
[im 47/342  lung]
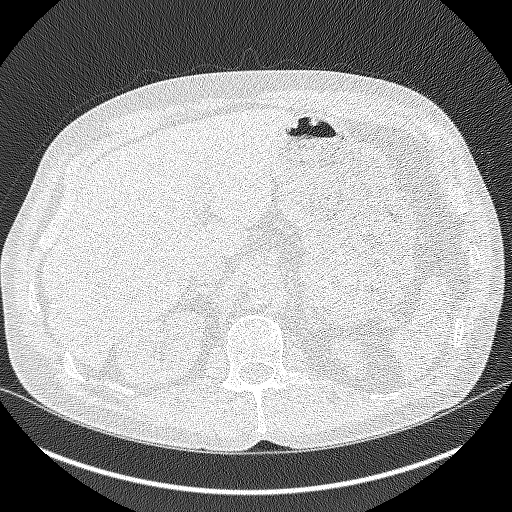
[im 78/342  lung]
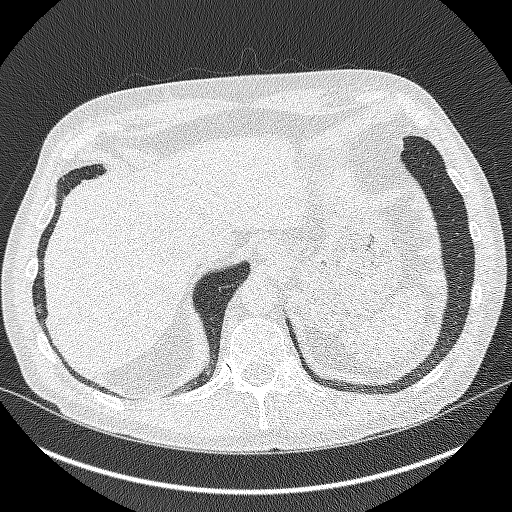
[im 109/342  lung]
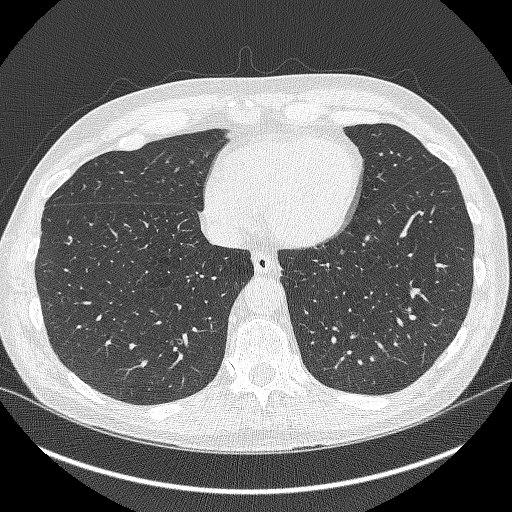
[im 125/342  mediastinal]
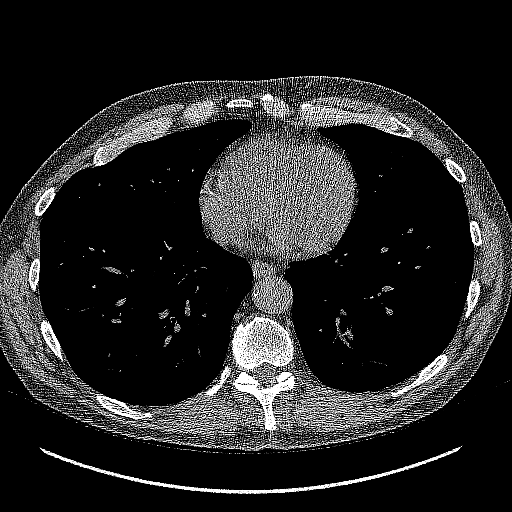
[im 125/342  lung]
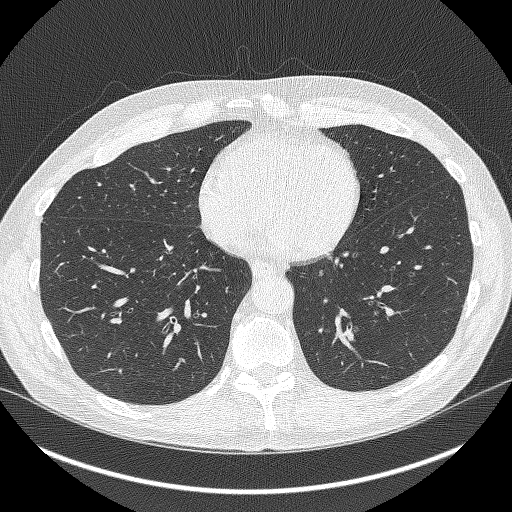
[im 156/342  lung]
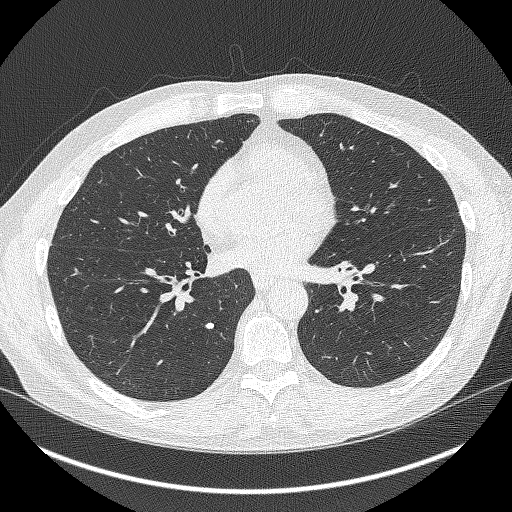
[im 187/342  lung]
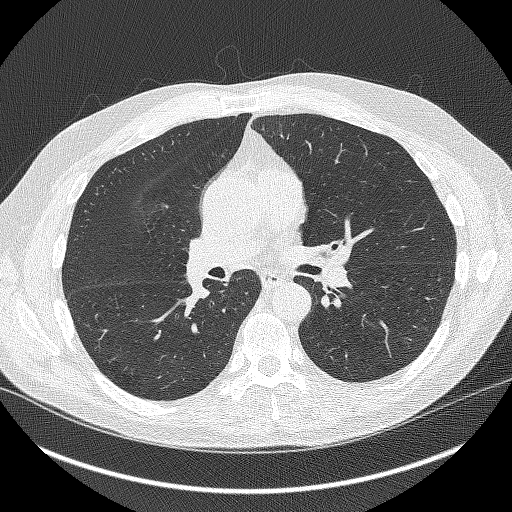
[im 218/342  lung]
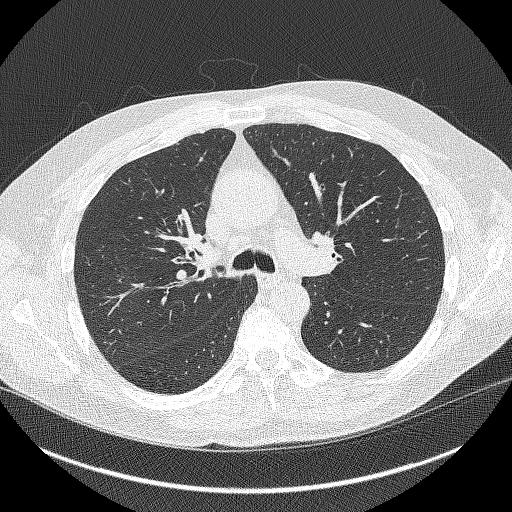
[im 233/342  mediastinal]
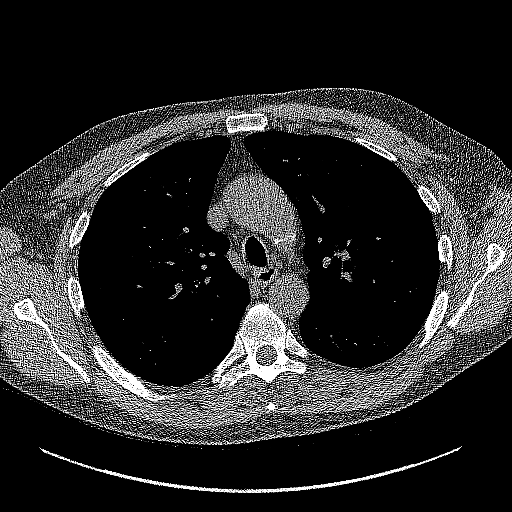
[im 233/342  lung]
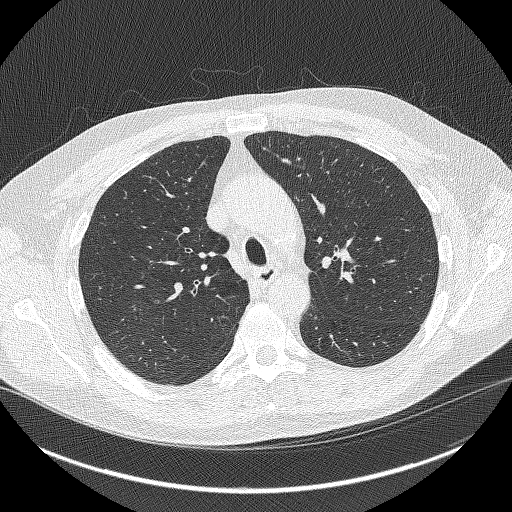
[im 264/342  lung]
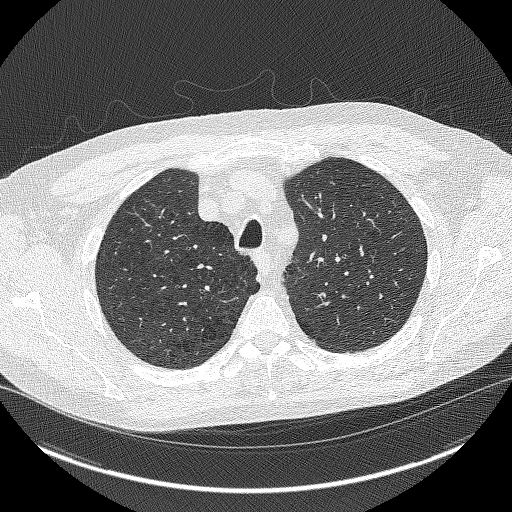
[im 295/342  lung]
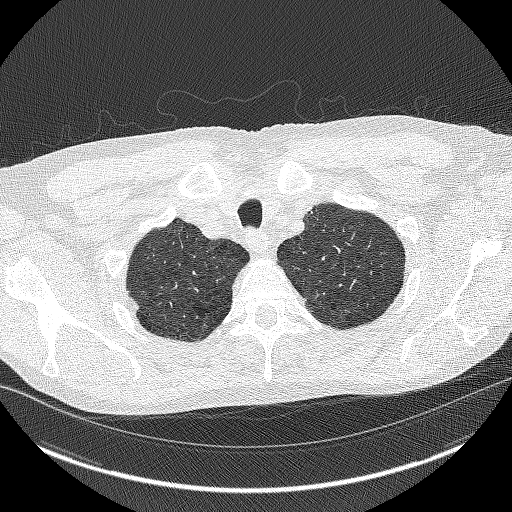
[im 326/342  lung]
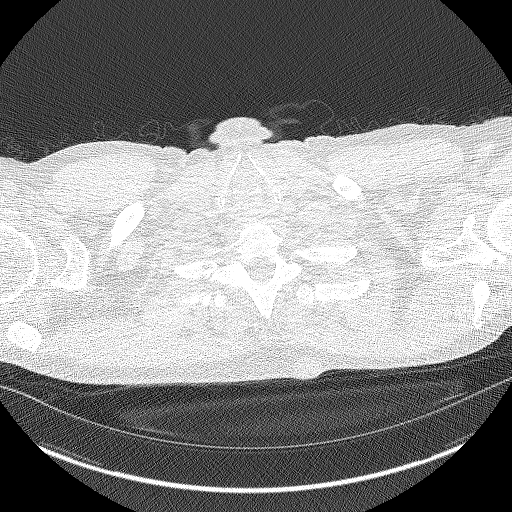

[Series 5: coronals lung 1.00 cor · coronal · 0.67mm/px · 3 of 261 slices shown]
[im 53/261  lung]
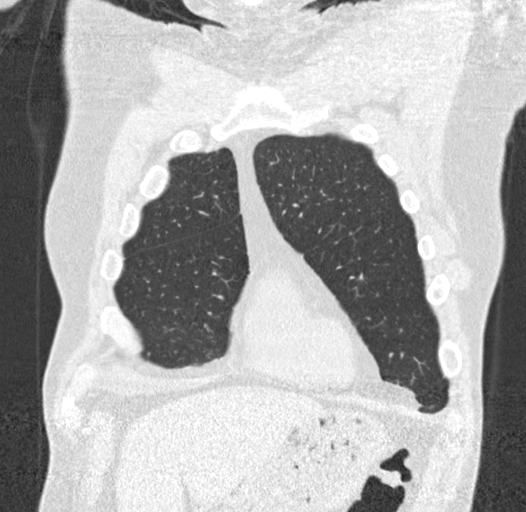
[im 105/261  lung]
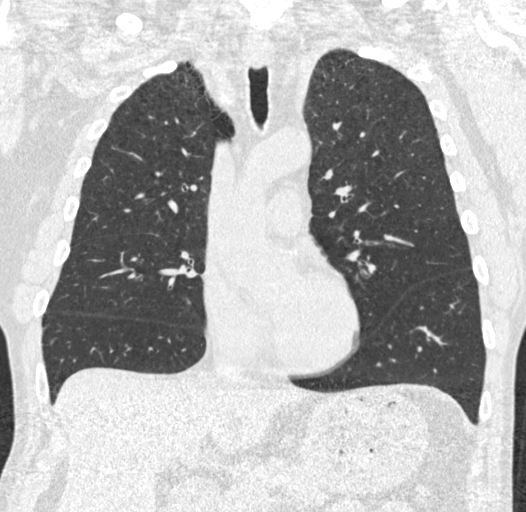
[im 157/261  lung]
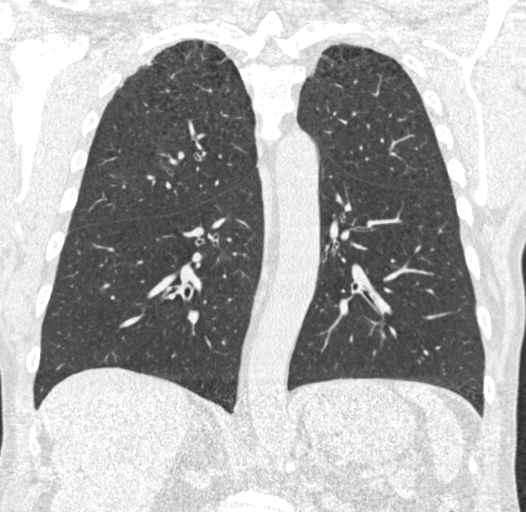

[15 of 40 positions shown; findings below may reference images not displayed]

FINDINGS: Cardiovascular: The heart size is normal. No substantial pericardial
effusion. Coronary artery calcification is evident. Mild
atherosclerotic calcification is noted in the wall of the thoracic
aorta. Ascending thoracic aorta measures 4.2 cm diameter.

Mediastinum/Nodes: No mediastinal lymphadenopathy. No evidence for
gross hilar lymphadenopathy although assessment is limited by the
lack of intravenous contrast on the current study. The esophagus has
normal imaging features. There is no axillary lymphadenopathy.

Lungs/Pleura: Centrilobular and paraseptal emphysema evident. Mild
biapical pleuroparenchymal scarring evident. Scattered tiny
bilateral calcified and noncalcified pulmonary nodules again noted.
No new suspicious pulmonary nodule or mass. No focal airspace
consolidation. No pleural effusion.

Upper Abdomen: Unremarkable.

Musculoskeletal: No worrisome lytic or sclerotic osseous
abnormality.
IMPRESSION: 1. Lung-RADS 2, benign appearance or behavior. Continue annual
screening with low-dose chest CT without contrast in 12 months.
2. 4.2 cm diameter ascending thoracic aorta, not substantially
changed. Continued attention on annual lung cancer screening CT
recommended.

Emphysema (EF5RX-DSJ.9) and Aortic Atherosclerosis (EF5RX-170.0)

## 2022-11-10 ENCOUNTER — Ambulatory Visit: Payer: Medicare Other | Admitting: Urology

## 2022-11-10 ENCOUNTER — Encounter: Payer: Self-pay | Admitting: Urology

## 2022-11-10 VITALS — BP 153/76 | HR 92 | Ht 70.0 in | Wt 167.0 lb

## 2022-11-10 DIAGNOSIS — D494 Neoplasm of unspecified behavior of bladder: Secondary | ICD-10-CM | POA: Diagnosis not present

## 2022-11-10 DIAGNOSIS — R310 Gross hematuria: Secondary | ICD-10-CM

## 2022-11-10 LAB — MICROSCOPIC EXAMINATION: RBC, Urine: >30 /hpf — AB (ref 0–2)

## 2022-11-10 LAB — URINALYSIS, COMPLETE
Bilirubin, UA: NEGATIVE
Glucose, UA: NEGATIVE
Ketones, UA: NEGATIVE
Nitrite, UA: NEGATIVE
Specific Gravity, UA: >1.03 — ABNORMAL HIGH (ref 1.005–1.030)
Urobilinogen, Ur: 0.2 mg/dL (ref 0.2–1.0)
pH, UA: 6 (ref 5.0–7.5)

## 2022-11-10 NOTE — Progress Notes (Signed)
   11/10/22  CC:  Chief Complaint  Patient presents with   Cysto    HPI: Refer to my prior note 09/21/2022.  CTU performed 10/28/2022 showed a 2.2 x 2.1 cm enhancing mass on the right bladder wall.  Blood pressure (!) 153/76, pulse 92, height 5\' 10"  (1.778 m), weight 167 lb (75.8 kg). NED. A&Ox3.   No respiratory distress   Abd soft, NT, ND Normal phallus with bilateral descended testicles  Cystoscopy Procedure Note  Patient identification was confirmed, informed consent was obtained, and patient was prepped using Betadine solution.  Lidocaine jelly was administered per urethral meatus.     Pre-Procedure: - Inspection reveals a normal caliber ureteral meatus.  Procedure: The flexible cystoscope was introduced without difficulty - No urethral strictures/lesions are present. - Moderate lateral lobe enlargement prostate  - Elevated bladder neck - Right ureteral orifice not identified - Bladder mucosa multifocal papillary tumors - No bladder stones - No trabeculation  Retroflexion shows 2 anterior papillary tumors near the 12 o'clock position; the 2.2 cm right sided tumor and smaller left bladder neck tumors   Post-Procedure: - Patient tolerated the procedure well  Assessment/ Plan:  1.  Bladder tumor Findings were discussed in detail and he was informed endoscopically consistent with urothelial carcinoma the bladder.  We discussed approximately 80% of urothelial carcinomas noninvasive Recommend TURBT.  The procedure was discussed including potential risks of bleeding, infection and bladder perforation.  His right UO could not be identified and if tumor is involving the ureteral orifice possible need for resection of the UO and stent placement was discussed Will plan on postop intravesical gemcitabine The possible need for additional therapy including intravesical therapy was reviewed All questions were answered and he desires to proceed   [name], MD

## 2022-11-11 ENCOUNTER — Encounter: Payer: Self-pay | Admitting: Urology

## 2022-11-11 NOTE — H&P (View-Only) (Signed)
 11/10/2022 8:02 AM   Ryan Dixon 04/24/1956 [id number]  Referring provider: Linthavong, Kanhka, MD [address] [facility] [place],  [place] [address]  Chief Complaint  Patient presents with   Cysto    HPI: Ryan Dixon is a 67 y.o. male initially seen 09/21/2022 with a history of intermittent gross hematuria since mid February 2023.  2 pack/day smoker since age 20.  CT urogram showed a 2.2 cm enhancing mass right bladder wall and cystoscopy performed today showed multifocal papillary tumors endoscopically consistent with urothelial carcinoma.  TURBT recommended   PMH: Past Medical History:  Diagnosis Date   Hypertension     Surgical History: Past Surgical History:  Procedure Laterality Date   ANKLE ARTHROSCOPY Left 08/11/2020   Procedure: LEFT ANKLE ARTHROSCOPY AND DEBRIDEMENT;  Surgeon: Duda, Marcus V, MD;  Location: [facility];  Service: Orthopedics;  Laterality: Left;   TONSILLECTOMY      Home Medications:  Allergies as of 11/10/2022       Reactions   Other Other (See Comments)   Anti-inflammatories cause GI upset, N/V        Medication List        Accurate as of Nov 10, 2022 11:59 PM. If you have any questions, ask your nurse or doctor.          atorvastatin 20 MG tablet Commonly known as: LIPITOR TAKE 1 TABLET BY MOUTH EVERY DAY AT NIGHT   hydrochlorothiazide 12.5 MG tablet Commonly known as: HYDRODIURIL Take 12.5 mg by mouth daily.   HYDROcodone-acetaminophen 5-325 MG tablet Commonly known as: NORCO/VICODIN Take 1 tablet by mouth every 4 (four) hours as needed for moderate pain.   meloxicam 7.5 MG tablet Commonly known as: MOBIC Take 7.5 mg by mouth daily.   sildenafil 50 MG tablet Commonly known as: VIAGRA Take 50 mg by mouth daily as needed.        Allergies:  Allergies  Allergen Reactions   Other Other (See Comments)    Anti-inflammatories cause GI upset, N/V    Family History: History  reviewed. No pertinent family history.  Social History:  reports that he has been smoking cigarettes. He has a 86.00 pack-year smoking history. He has never used smokeless tobacco. He reports current alcohol use. He reports that he does not use drugs.   Physical Exam: BP (!) 153/76   Pulse 92   Ht 5' 10" (1.778 m)   Wt 167 lb (75.8 kg)   BMI 23.96 kg/m   Constitutional:  Alert and oriented, No acute distress. HEENT: [place] AT, moist mucus membranes.  Trachea midline, no masses. Cardiovascular: RRR Respiratory: Normal respiratory effort, lungs clear GI: Abdomen is soft, nontender, nondistended, no abdominal masses GU: No CVA tenderness Skin: No rashes, bruises or suspicious lesions. Neurologic: Grossly intact, no focal deficits, moving all 4 extremities. Psychiatric: Normal mood and affect.   Pertinent Imaging: CT images were personally viewed and interpreted  CT HEMATURIA WORKUP  Narrative CLINICAL DATA:  Hematuria  EXAM: CT ABDOMEN AND PELVIS WITHOUT AND WITH CONTRAST  TECHNIQUE: Multidetector CT imaging of the abdomen and pelvis was performed following the standard protocol before and following the bolus administration of intravenous contrast.  RADIATION DOSE REDUCTION: This exam was performed according to the departmental dose-optimization program which includes automated exposure control, adjustment of the mA and/or kV according to patient size and/or use of iterative reconstruction technique.  CONTRAST:  100mL OMNIPAQUE IOHEXOL 300 MG/ML  SOLN  COMPARISON:  None   Available.  FINDINGS: Lower chest: Lung bases are clear.  No pleural effusion.  Hepatobiliary: No focal liver abnormality is seen. No gallstones, gallbladder wall thickening, or biliary dilatation.  Pancreas: Unremarkable. No pancreatic ductal dilatation or surrounding inflammatory changes.  Spleen: Normal in size without focal abnormality.  Adrenals/Urinary Tract: The adrenal glands are preserved.  No renal or ureteral stones are identified. No enhancing renal mass. The calices are delicately cupped. No filling defect, dilatation or mural thickening along the course of the ureters or renal collecting systems proximally. Minimal ectasia of the distal right ureter compared to left.  There is a rounded mass identified along the right side of the urinary bladder posteriorly. This has measured on series 7, image 63 2.2 by 2.1 cm and is clearly enhancing comparing to the precontrast dataset worrisome for a bladder neoplasm. Adjacent mild diffuse bladder wall thickening. Based on location this may involve the extreme distal aspect of the right UVJ. The bladder is underdistended. Slight trabeculation of the bladder. Recommend further evaluation.  Stomach/Bowel: Moderate debris in the stomach. Small and large bowel are nondilated. Normal appendix. Scattered stool. Few colonic diverticula.  Vascular/Lymphatic: Normal caliber IVC. Circumaortic left renal vein. No specific abnormal lymph node enlargement identified in the abdomen and pelvis. Extensive vascular calcifications identified. 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 Please correlate with any particular symptoms including for claudication. Further workup as clinically appropriate. Also areas of potential occlusion along the left internal iliac vessels.  Reproductive: Prostate is unremarkable.  Other: Small fat containing umbilical hernia. No free air or free fluid. Small bilateral fat containing inguinal hernias.  Musculoskeletal: Scattered degenerative changes.  IMPRESSION: 2.2 cm bladder mass worrisome for neoplasm. Lesion may involve the right UVJ as well. No additional urothelial mass, renal mass or stone. Recommend further evaluation.  Colonic diverticula.  Extensive atherosclerotic changes with the occlusion of the  right common iliac artery and areas of significant stenosis along the left. Please correlate with any symptoms such as claudication and further workup as clinically appropriate.  Findings will be called to the ordering service by the Radiology physician assistant team   Electronically Signed By: Ashok  Gupta M.D. On: 11/01/2022 17:09    Assessment & Plan:    1.  Multifocal bladder tumor Endoscopically consistent with urothelial carcinoma TURBT was recommended and discussed in detail for today's cystoscopy note He desires to schedule   Hermelinda Diegel C Bell Cai, MD  [facility] [address], [address] [place], [place] [address] [phone] 

## 2022-11-11 NOTE — Progress Notes (Signed)
11/10/2022 8:02 AM   Ryan Dixon 01-07-1956 [id number]  Referring provider: Marisue Ivan, MD [phone] [facility] [address] [facility] [place],  [place] [address]  Chief Complaint  Patient presents with   Cysto    HPI: Ryan Dixon is a 67 y.o. male initially seen 09/21/2022 with a history of intermittent gross hematuria since mid February 2023.  2 pack/day smoker since age 74.  CT urogram showed a 2.2 cm enhancing mass right bladder wall and cystoscopy performed today showed multifocal papillary tumors endoscopically consistent with urothelial carcinoma.  TURBT recommended   PMH: Past Medical History:  Diagnosis Date   Hypertension     Surgical History: Past Surgical History:  Procedure Laterality Date   ANKLE ARTHROSCOPY Left 08/11/2020   Procedure: LEFT ANKLE ARTHROSCOPY AND DEBRIDEMENT;  Surgeon: Nadara Mustard, MD;  Location: [facility];  Service: Orthopedics;  Laterality: Left;   TONSILLECTOMY      Home Medications:  Allergies as of 11/10/2022       Reactions   Other Other (See Comments)   Anti-inflammatories cause GI upset, N/V        Medication List        Accurate as of Nov 10, 2022 11:59 PM. If you have any questions, ask your nurse or doctor.          atorvastatin 20 MG tablet Commonly known as: LIPITOR TAKE 1 TABLET BY MOUTH EVERY DAY AT NIGHT   hydrochlorothiazide 12.5 MG tablet Commonly known as: HYDRODIURIL Take 12.5 mg by mouth daily.   HYDROcodone-acetaminophen 5-325 MG tablet Commonly known as: NORCO/VICODIN Take 1 tablet by mouth every 4 (four) hours as needed for moderate pain.   meloxicam 7.5 MG tablet Commonly known as: MOBIC Take 7.5 mg by mouth daily.   sildenafil 50 MG tablet Commonly known as: VIAGRA Take 50 mg by mouth daily as needed.        Allergies:  Allergies  Allergen Reactions   Other Other (See Comments)    Anti-inflammatories cause GI upset, N/V    Family History: History  reviewed. No pertinent family history.  Social History:  reports that he has been smoking cigarettes. He has a 86.00 pack-year smoking history. He has never used smokeless tobacco. He reports current alcohol use. He reports that he does not use drugs.   Physical Exam: BP (!) 153/76   Pulse 92   Ht 5\' 10"  (1.778 m)   Wt 167 lb (75.8 kg)   BMI 23.96 kg/m   Constitutional:  Alert and oriented, No acute distress. HEENT: [place] AT, moist mucus membranes.  Trachea midline, no masses. Cardiovascular: RRR Respiratory: Normal respiratory effort, lungs clear GI: Abdomen is soft, nontender, nondistended, no abdominal masses GU: No CVA tenderness Skin: No rashes, bruises or suspicious lesions. Neurologic: Grossly intact, no focal deficits, moving all 4 extremities. Psychiatric: Normal mood and affect.   Pertinent Imaging: CT images were personally viewed and interpreted  CT HEMATURIA WORKUP  Narrative CLINICAL DATA:  Hematuria  EXAM: CT ABDOMEN AND PELVIS WITHOUT AND WITH CONTRAST  TECHNIQUE: Multidetector CT imaging of the abdomen and pelvis was performed following the standard protocol before and following the bolus administration of intravenous contrast.  RADIATION DOSE REDUCTION: This exam was performed according to the departmental dose-optimization program which includes automated exposure control, adjustment of the mA and/or kV according to patient size and/or use of iterative reconstruction technique.  CONTRAST:  OMNIPAQUE IOHEXOL 300 MG/ML  SOLN  COMPARISON:  None  Available.  FINDINGS: Lower chest: Lung bases are clear.  No pleural effusion.  Hepatobiliary: No focal liver abnormality is seen. No gallstones, gallbladder wall thickening, or biliary dilatation.  Pancreas: Unremarkable. No pancreatic ductal dilatation or surrounding inflammatory changes.  Spleen: Normal in size without focal abnormality.  Adrenals/Urinary Tract: The adrenal glands are preserved.  No renal or ureteral stones are identified. No enhancing renal mass. The calices are delicately cupped. No filling defect, dilatation or mural thickening along the course of the ureters or renal collecting systems proximally. Minimal ectasia of the distal right ureter compared to left.  There is a rounded mass identified along the right side of the urinary bladder posteriorly. This has measured on series 7, image 63 2.2 by 2.1 cm and is clearly enhancing comparing to the precontrast dataset worrisome for a bladder neoplasm. Adjacent mild diffuse bladder wall thickening. Based on location this may involve the extreme distal aspect of the right UVJ. The bladder is underdistended. Slight trabeculation of the bladder. Recommend further evaluation.  Stomach/Bowel: Moderate debris in the stomach. Small and large bowel are nondilated. Normal appendix. Scattered stool. Few colonic diverticula.  Vascular/Lymphatic: Normal caliber IVC. Circumaortic left renal vein. No specific abnormal lymph node enlargement identified in the abdomen and pelvis. Extensive vascular calcifications identified. 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 Please correlate with any particular symptoms including for claudication. Further workup as clinically appropriate. Also areas of potential occlusion along the left internal iliac vessels.  Reproductive: Prostate is unremarkable.  Other: Small fat containing umbilical hernia. No free air or free fluid. Small bilateral fat containing inguinal hernias.  Musculoskeletal: Scattered degenerative changes.  IMPRESSION: 2.2 cm bladder mass worrisome for neoplasm. Lesion may involve the right UVJ as well. No additional urothelial mass, renal mass or stone. Recommend further evaluation.  Colonic diverticula.  Extensive atherosclerotic changes with the occlusion of the  right common iliac artery and areas of significant stenosis along the left. Please correlate with any symptoms such as claudication and further workup as clinically appropriate.  Findings will be called to the ordering service by the Radiology physician assistant team   Electronically Signed By: Karen Kays M.D. On: 11/01/2022 17:09    Assessment & Plan:    1.  Multifocal bladder tumor Endoscopically consistent with urothelial carcinoma TURBT was recommended and discussed in detail for today's cystoscopy note He desires to schedule   [name], MD  [facility] [address], [address] [place], [place] [address] [phone]

## 2022-11-13 ENCOUNTER — Other Ambulatory Visit: Payer: Self-pay | Admitting: Urology

## 2022-11-13 DIAGNOSIS — D494 Neoplasm of unspecified behavior of bladder: Secondary | ICD-10-CM

## 2022-11-13 NOTE — Progress Notes (Unsigned)
Surgical Physician Order Form [facility]  * Scheduling expectation : Next Available  *Length of Case: 60 min  *Clearance needed: no  *Anticoagulation Instructions: N/A  *Aspirin Instructions: N/A  *Post-op visit Date/Instructions:  1-2 week with pathology review  *Diagnosis: Bladder Tumor  *Procedure:  TURBT 2-5cm (44034)   Additional orders: Gemcitabine 2000mg  bladder instillation  -Admit type: OUTpatient  -Anesthesia: Choice  -VTE Prophylaxis Standing Order SCD's       Other:   -Standing Lab Orders Per Anesthesia    Lab other: UA&Urine Culture  -Standing Test orders EKG/Chest x-ray per Anesthesia       Test other:   - Medications:  Ancef 2gm IV  -Other orders:  N/A

## 2022-11-14 ENCOUNTER — Telehealth: Payer: Self-pay

## 2022-11-14 NOTE — Progress Notes (Signed)
   [facility] Surgical Posting Form  Surgery Date: Date: 11/29/2022  Surgeon: Dr. Irineo Axon, MD  Inpt ( No  )   Outpt (Yes)   Obs ( No  )   Diagnosis: D49.4 Bladder Tumor  -CPT: 08657  Surgery: Transurethral Resection of Bladder Tumor with Intravesical Instillation of Gemcitabine  Stop Anticoagulations: Yes and hold ASA  Cardiac/Medical/Pulmonary Clearance needed: no  *Orders entered into EPIC  Date: 11/14/22   *Case booked in [place]  Date: 11/14/22  *Notified pt of Surgery: Date: 11/14/22  PRE-OP UA & CX: yes, will obtain in clinic tomorrow 11/15/2022  *Placed into Prior Authorization Work Angela Nevin Date: 11/14/22  Assistant/laser/rep:No               .

## 2022-11-14 NOTE — Telephone Encounter (Signed)
I spoke with Ryan Dixon. We have discussed possible surgery dates and Tuesday June 18th, 2024 was agreed upon by all parties. Patient given information about surgery date, what to expect pre-operatively and post operatively.  We discussed that a Pre-Admission Testing office will be calling to set up the pre-op visit that will take place prior to surgery, and that these appointments are typically done over the phone with a Pre-Admissions RN. Informed patient that our office will communicate any additional care to be provided after surgery. Patients questions or concerns were discussed during our call. Advised to call our office should there be any additional information, questions or concerns that arise. Patient verbalized understanding.

## 2022-11-15 ENCOUNTER — Other Ambulatory Visit: Payer: Medicare Other

## 2022-11-15 DIAGNOSIS — D494 Neoplasm of unspecified behavior of bladder: Secondary | ICD-10-CM

## 2022-11-15 LAB — URINALYSIS, COMPLETE
Bilirubin, UA: NEGATIVE
Glucose, UA: NEGATIVE
Ketones, UA: NEGATIVE
Leukocytes,UA: NEGATIVE
Nitrite, UA: NEGATIVE
Specific Gravity, UA: >1.03 — ABNORMAL HIGH (ref 1.005–1.030)
Urobilinogen, Ur: 0.2 mg/dL (ref 0.2–1.0)
pH, UA: 6 (ref 5.0–7.5)

## 2022-11-15 LAB — MICROSCOPIC EXAMINATION: RBC, Urine: >30 /hpf — AB (ref 0–2)

## 2022-11-16 ENCOUNTER — Telehealth: Payer: Self-pay

## 2022-11-16 NOTE — Telephone Encounter (Signed)
Pt called stating he has blood in his urine, issues started yesterday but has gotten worse today. Pt states his urine is dark red and has noticed clots in his urine. Informed pt that I will talk to the provider on call and will return his call later today, pt verbalized understanding. Pt states he leaves for a 4 day trip tomorrow morning and wanted to know if he should cancel.

## 2022-11-16 NOTE — Telephone Encounter (Signed)
Per Dr.Brandon, pt was informed that his will continue to have bleeding until the surgery with Dr. Lonna Cobb. Pt states he is not having pain or symptoms of urinary retention. Pt was advised to call or go to the ED if he starts experiencing pain or retention. Pt verbalized understanding.

## 2022-11-18 LAB — CULTURE, URINE COMPREHENSIVE

## 2022-11-19 LAB — CULTURE, URINE COMPREHENSIVE

## 2022-11-21 ENCOUNTER — Ambulatory Visit
Admission: RE | Admit: 2022-11-21 | Discharge: 2022-11-21 | Disposition: A | Discharge status: Home / Self Care | Payer: Medicare Other | Source: Ambulatory Visit | Attending: Acute Care | Admitting: Acute Care

## 2022-11-21 ENCOUNTER — Telehealth: Payer: Self-pay

## 2022-11-21 DIAGNOSIS — Z87891 Personal history of nicotine dependence: Secondary | ICD-10-CM | POA: Insufficient documentation

## 2022-11-21 DIAGNOSIS — F17210 Nicotine dependence, cigarettes, uncomplicated: Secondary | ICD-10-CM | POA: Insufficient documentation

## 2022-11-21 DIAGNOSIS — Z122 Encounter for screening for malignant neoplasm of respiratory organs: Secondary | ICD-10-CM | POA: Diagnosis present

## 2022-11-21 MED ORDER — CIPROFLOXACIN HCL 500 MG PO TABS: 500.0000 mg | ORAL_TABLET | Freq: Two times a day (BID) | ORAL | 0 refills | Status: DC

## 2022-11-21 NOTE — Telephone Encounter (Signed)
Spoke with pt. Pt. Advised of results and asked that medication be sent to [facility] [address] [address] which I have done. Abx switched for OR orders as well.

## 2022-11-21 NOTE — Telephone Encounter (Signed)
-----   Message from [name], MD sent at 11/20/2022 11:11 AM EDT ----- Preop urine culture growing a low level of Enterococcus consistent with colonization.  Please send Rx Cipro 500 mg twice daily x 5 days.  Have him get into doses on 6/10.  Please change preoperative IV antibiotics to Cipro 400 mg IV

## 2022-11-21 NOTE — Addendum Note (Signed)
Addended by: Letta Kocher A on: 11/21/2022 10:36 AM   Modules accepted: Orders

## 2022-11-22 ENCOUNTER — Encounter
Admission: RE | Admit: 2022-11-22 | Discharge: 2022-11-22 | Disposition: A | Discharge status: Home / Self Care | Payer: Medicare Other | Source: Ambulatory Visit | Attending: Urology | Admitting: Urology

## 2022-11-22 ENCOUNTER — Other Ambulatory Visit: Payer: Self-pay

## 2022-11-22 ENCOUNTER — Other Ambulatory Visit: Payer: Self-pay | Admitting: Acute Care

## 2022-11-22 DIAGNOSIS — Z01812 Encounter for preprocedural laboratory examination: Secondary | ICD-10-CM

## 2022-11-22 DIAGNOSIS — Z122 Encounter for screening for malignant neoplasm of respiratory organs: Secondary | ICD-10-CM

## 2022-11-22 DIAGNOSIS — I10 Essential (primary) hypertension: Secondary | ICD-10-CM

## 2022-11-22 DIAGNOSIS — F17210 Nicotine dependence, cigarettes, uncomplicated: Secondary | ICD-10-CM

## 2022-11-22 DIAGNOSIS — Z87891 Personal history of nicotine dependence: Secondary | ICD-10-CM

## 2022-11-22 HISTORY — DX: Nicotine dependence, unspecified, uncomplicated: F17.200

## 2022-11-22 HISTORY — DX: Other chronic pain: G89.29

## 2022-11-22 HISTORY — DX: Other male erectile dysfunction: N52.8

## 2022-11-22 HISTORY — DX: Atherosclerosis of aorta: I70.0

## 2022-11-22 HISTORY — DX: Prediabetes: R73.03

## 2022-11-22 HISTORY — DX: Pure hypercholesterolemia, unspecified: E78.00

## 2022-11-22 HISTORY — DX: Other emphysema: J43.8

## 2022-11-22 HISTORY — DX: Personal history of other diseases of urinary system: Z87.448

## 2022-11-22 NOTE — Patient Instructions (Addendum)
Your procedure is scheduled on: 11/29/22 - Tuesday Report to the Registration Desk on the 1st floor of the [facility]. To find out your arrival time, please call [phone] between 1PM - 3PM on: 11/28/22 - Monday If your arrival time is 6:00 am, do not arrive before that time as the [facility] Mall entrance doors do not open until 6:00 am.  REMEMBER: Instructions that are not followed completely may result in serious medical risk, up to and including death; or upon the discretion of your surgeon and anesthesiologist your surgery may need to be rescheduled.  Do not eat food or drink any liquids after midnight the night before surgery.  No gum chewing or hard candies   One week prior to surgery: Stop Anti-inflammatories (NSAIDS) such as Advil, Aleve, Ibuprofen, Motrin, Naproxen, Naprosyn and Aspirin based products such as Excedrin, Goody's Powder, BC Powder.  Stop ANY OVER THE COUNTER supplements until after surgery.  You may  take Tylenol if needed for pain up until the day of surgery.   TAKE ONLY THESE MEDICATIONS THE MORNING OF SURGERY WITH A SIP OF WATER: NONE   No Alcohol for 24 hours before or after surgery.  No Smoking including e-cigarettes for 24 hours before surgery.  No chewable tobacco products for at least 6 hours before surgery.  No nicotine patches on the day of surgery.  Do not use any "recreational" drugs for at least a week (preferably 2 weeks) before your surgery.  Please be advised that the combination of cocaine and anesthesia may have negative outcomes, up to and including death. If you test positive for cocaine, your surgery will be cancelled.  On the morning of surgery brush your teeth with toothpaste and water, you may rinse your mouth with mouthwash if you wish. Do not swallow any toothpaste or mouthwash.  Do not wear jewelry, make-up, hairpins, clips or nail polish.  Do not wear lotions, powders, or perfumes.   Do not shave body hair from the neck  down 48 hours before surgery.  Contact lenses, hearing aids and dentures may not be worn into surgery.  Do not bring valuables to the hospital. [facility] is not responsible for any missing/lost belongings or valuables.   Notify your doctor if there is any change in your medical condition (cold, fever, infection).  Wear comfortable clothing (specific to your surgery type) to the hospital.  After surgery, you can help prevent lung complications by doing breathing exercises.  Take deep breaths and cough every 1-2 hours. Your doctor may order a device called an Incentive Spirometer to help you take deep breaths. When coughing or sneezing, hold a pillow firmly against your incision with both hands. This is called "splinting." Doing this helps protect your incision. It also decreases belly discomfort.  If you are being admitted to the hospital overnight, leave your suitcase in the car. After surgery it may be brought to your room.  In case of increased patient census, it may be necessary for you, the patient, to continue your postoperative care in the Same Day Surgery department.  If you are being discharged the day of surgery, you will not be allowed to drive home. You will need a responsible individual to drive you home and stay with you for 24 hours after surgery.   If you are taking public transportation, you will need to have a responsible individual with you.  Please call the Pre-admissions Testing Dept. at [phone] if you have any questions about these instructions.  Surgery Visitation Policy:  Patients having surgery or a procedure may have two visitors.  Children under the age of 65 must have an adult with them who is not the patient.  Inpatient Visitation:    Visiting hours are 7 a.m. to 8 p.m. Up to four visitors are allowed at one time in a patient room. The visitors may rotate out with other people during the day.  One visitor age 65 or older may stay with the patient  overnight and must be in the room by 8 p.m.

## 2022-11-23 ENCOUNTER — Ambulatory Visit: Admit: 2022-11-23 | Payer: Medicare Other | Admitting: Internal Medicine

## 2022-11-23 SURGERY — COLONOSCOPY WITH PROPOFOL
Anesthesia: General

## 2022-11-24 ENCOUNTER — Encounter
Admission: RE | Admit: 2022-11-24 | Discharge: 2022-11-24 | Disposition: A | Discharge status: Home / Self Care | Payer: Medicare Other | Source: Ambulatory Visit | Attending: Urology | Admitting: Urology

## 2022-11-24 DIAGNOSIS — Z01818 Encounter for other preprocedural examination: Secondary | ICD-10-CM | POA: Insufficient documentation

## 2022-11-24 DIAGNOSIS — I10 Essential (primary) hypertension: Secondary | ICD-10-CM | POA: Insufficient documentation

## 2022-11-24 DIAGNOSIS — Z01812 Encounter for preprocedural laboratory examination: Secondary | ICD-10-CM

## 2022-11-24 LAB — BASIC METABOLIC PANEL
Anion gap: 10 (ref 5–15)
BUN: 19 mg/dL (ref 8–23)
CO2: 29 mmol/L (ref 22–32)
Calcium: 9.1 mg/dL (ref 8.9–10.3)
Chloride: 99 mmol/L (ref 98–111)
Creatinine, Ser: 1.21 mg/dL (ref 0.61–1.24)
GFR, Estimated: >60 mL/min (ref >60)
Glucose, Bld: 98 mg/dL (ref 70–99)
Potassium: 3.6 mmol/L (ref 3.5–5.1)
Sodium: 138 mmol/L (ref 135–145)

## 2022-11-24 LAB — CBC
HCT: 42 % (ref 39.0–52.0)
Hemoglobin: 14.2 g/dL (ref 13.0–17.0)
MCH: 32.2 pg (ref 26.0–34.0)
MCHC: 33.8 g/dL (ref 30.0–36.0)
MCV: 95.2 fL (ref 80.0–100.0)
Platelets: 393 10*3/uL (ref 150–400)
RBC: 4.41 MIL/uL (ref 4.22–5.81)
RDW: 12.3 % (ref 11.5–15.5)
WBC: 8.4 10*3/uL (ref 4.0–10.5)
nRBC: 0 % (ref 0.0–0.2)

## 2022-11-28 MED ORDER — CHLORHEXIDINE GLUCONATE 0.12 % MT SOLN
15.0000 mL | Freq: Once | OROMUCOSAL | Status: AC
  Administered 2022-11-29: 15 mL via OROMUCOSAL

## 2022-11-28 MED ORDER — CIPROFLOXACIN IN D5W 400 MG/200ML IV SOLN
400.0000 mg | Freq: Once | INTRAVENOUS | Status: AC
  Administered 2022-11-29: 400 mg via INTRAVENOUS

## 2022-11-28 MED ORDER — LACTATED RINGERS IV SOLN: INTRAVENOUS | Status: DC

## 2022-11-28 MED ORDER — GEMCITABINE CHEMO FOR BLADDER INSTILLATION 2000 MG
2000.0000 mg | Freq: Once | INTRAVENOUS | Status: DC
  Filled 2022-11-28: qty 52.6

## 2022-11-28 MED ORDER — ORAL CARE MOUTH RINSE: 15.0000 mL | Freq: Once | OROMUCOSAL | Status: AC

## 2022-11-29 ENCOUNTER — Ambulatory Visit: Payer: Medicare Other | Admitting: Urgent Care

## 2022-11-29 ENCOUNTER — Encounter
Admission: RE | Disposition: A | Discharge status: Home / Self Care | Payer: Self-pay | Source: Home / Self Care | Attending: Urology

## 2022-11-29 ENCOUNTER — Other Ambulatory Visit: Payer: Self-pay

## 2022-11-29 ENCOUNTER — Ambulatory Visit
Admission: RE | Admit: 2022-11-29 | Discharge: 2022-11-29 | Disposition: A | Discharge status: Home / Self Care | Payer: Medicare Other | Attending: Urology | Admitting: Urology

## 2022-11-29 ENCOUNTER — Encounter: Payer: Self-pay | Admitting: Urology

## 2022-11-29 ENCOUNTER — Ambulatory Visit: Payer: Medicare Other | Admitting: Anesthesiology

## 2022-11-29 DIAGNOSIS — D494 Neoplasm of unspecified behavior of bladder: Secondary | ICD-10-CM | POA: Diagnosis present

## 2022-11-29 DIAGNOSIS — C679 Malignant neoplasm of bladder, unspecified: Secondary | ICD-10-CM | POA: Insufficient documentation

## 2022-11-29 DIAGNOSIS — F17210 Nicotine dependence, cigarettes, uncomplicated: Secondary | ICD-10-CM | POA: Diagnosis not present

## 2022-11-29 DIAGNOSIS — C675 Malignant neoplasm of bladder neck: Secondary | ICD-10-CM | POA: Diagnosis not present

## 2022-11-29 HISTORY — PX: BLADDER INSTILLATION: SHX6893

## 2022-11-29 HISTORY — PX: TRANSURETHRAL RESECTION OF BLADDER TUMOR: SHX2575

## 2022-11-29 SURGERY — TURBT (TRANSURETHRAL RESECTION OF BLADDER TUMOR)
Anesthesia: General

## 2022-11-29 MED ORDER — PHENYLEPHRINE 80 MCG/ML (10ML) SYRINGE FOR IV PUSH (FOR BLOOD PRESSURE SUPPORT)
PREFILLED_SYRINGE | INTRAVENOUS | Status: DC | PRN
  Administered 2022-11-29: 160 ug via INTRAVENOUS

## 2022-11-29 MED ORDER — PROPOFOL 10 MG/ML IV BOLUS
INTRAVENOUS | Status: DC | PRN
  Administered 2022-11-29: 150 mg via INTRAVENOUS

## 2022-11-29 MED ORDER — ONDANSETRON HCL 4 MG/2ML IJ SOLN
INTRAMUSCULAR | Status: DC | PRN
  Administered 2022-11-29 (×2): 4 mg via INTRAVENOUS

## 2022-11-29 MED ORDER — SUGAMMADEX SODIUM 200 MG/2ML IV SOLN
INTRAVENOUS | Status: DC | PRN
  Administered 2022-11-29: 200 mg via INTRAVENOUS

## 2022-11-29 MED ORDER — MIDAZOLAM HCL 2 MG/2ML IJ SOLN
INTRAMUSCULAR | Status: DC | PRN
  Administered 2022-11-29: 2 mg via INTRAVENOUS

## 2022-11-29 MED ORDER — SODIUM CHLORIDE 0.9 % IR SOLN
Status: DC | PRN
  Administered 2022-11-29 (×2): 6000 mL via INTRAVESICAL

## 2022-11-29 MED ORDER — CHLORHEXIDINE GLUCONATE 0.12 % MT SOLN
OROMUCOSAL | Status: AC
  Filled 2022-11-29: qty 15

## 2022-11-29 MED ORDER — HYDROCODONE-ACETAMINOPHEN 5-325 MG PO TABS: 1.0000 | ORAL_TABLET | Freq: Four times a day (QID) | ORAL | 0 refills | Status: DC | PRN

## 2022-11-29 MED ORDER — FAMOTIDINE 20 MG PO TABS
ORAL_TABLET | ORAL | Status: AC
  Filled 2022-11-29: qty 1

## 2022-11-29 MED ORDER — CIPROFLOXACIN IN D5W 400 MG/200ML IV SOLN
INTRAVENOUS | Status: AC
  Filled 2022-11-29: qty 200

## 2022-11-29 MED ORDER — FAMOTIDINE 20 MG PO TABS
20.0000 mg | ORAL_TABLET | Freq: Once | ORAL | Status: AC
  Administered 2022-11-29: 20 mg via ORAL

## 2022-11-29 MED ORDER — ACETAMINOPHEN 10 MG/ML IV SOLN
INTRAVENOUS | Status: DC | PRN
  Administered 2022-11-29: 1000 mg via INTRAVENOUS

## 2022-11-29 MED ORDER — LIDOCAINE HCL (CARDIAC) PF 100 MG/5ML IV SOSY
PREFILLED_SYRINGE | INTRAVENOUS | Status: DC | PRN
  Administered 2022-11-29: 100 mg via INTRAVENOUS

## 2022-11-29 MED ORDER — SUCCINYLCHOLINE CHLORIDE 200 MG/10ML IV SOSY
PREFILLED_SYRINGE | INTRAVENOUS | Status: DC | PRN
  Administered 2022-11-29: 100 mg via INTRAVENOUS

## 2022-11-29 MED ORDER — GLYCOPYRROLATE 0.2 MG/ML IJ SOLN
INTRAMUSCULAR | Status: DC | PRN
  Administered 2022-11-29: .2 mg via INTRAVENOUS

## 2022-11-29 MED ORDER — HYDROMORPHONE HCL 1 MG/ML IJ SOLN: 0.2500 mg | INTRAMUSCULAR | Status: DC | PRN

## 2022-11-29 MED ORDER — MIDAZOLAM HCL 2 MG/2ML IJ SOLN
INTRAMUSCULAR | Status: AC
  Filled 2022-11-29: qty 2

## 2022-11-29 MED ORDER — OXYCODONE HCL 5 MG/5ML PO SOLN: 5.0000 mg | Freq: Once | ORAL | Status: DC | PRN

## 2022-11-29 MED ORDER — DEXAMETHASONE SODIUM PHOSPHATE 10 MG/ML IJ SOLN
INTRAMUSCULAR | Status: DC | PRN
  Administered 2022-11-29: 10 mg via INTRAVENOUS

## 2022-11-29 MED ORDER — DEXMEDETOMIDINE HCL IN NACL 200 MCG/50ML IV SOLN
INTRAVENOUS | Status: DC | PRN
  Administered 2022-11-29: 12 ug via INTRAVENOUS

## 2022-11-29 MED ORDER — FENTANYL CITRATE (PF) 100 MCG/2ML IJ SOLN
INTRAMUSCULAR | Status: AC
  Filled 2022-11-29: qty 2

## 2022-11-29 MED ORDER — PHENAZOPYRIDINE HCL 200 MG PO TABS: 200.0000 mg | ORAL_TABLET | Freq: Three times a day (TID) | ORAL | 0 refills | Status: DC | PRN

## 2022-11-29 MED ORDER — ROCURONIUM BROMIDE 100 MG/10ML IV SOLN
INTRAVENOUS | Status: DC | PRN
  Administered 2022-11-29: 50 mg via INTRAVENOUS

## 2022-11-29 MED ORDER — FENTANYL CITRATE (PF) 100 MCG/2ML IJ SOLN
INTRAMUSCULAR | Status: DC | PRN
  Administered 2022-11-29 (×2): 50 ug via INTRAVENOUS

## 2022-11-29 MED ORDER — PROPOFOL 1000 MG/100ML IV EMUL
INTRAVENOUS | Status: AC
  Filled 2022-11-29: qty 100

## 2022-11-29 MED ORDER — GEMCITABINE CHEMO FOR BLADDER INSTILLATION 2000 MG
INTRAVENOUS | Status: DC | PRN
  Administered 2022-11-29: 2000 mg via INTRAVESICAL

## 2022-11-29 MED ORDER — OXYCODONE HCL 5 MG PO TABS: 5.0000 mg | ORAL_TABLET | Freq: Once | ORAL | Status: DC | PRN

## 2022-11-29 SURGICAL SUPPLY — 23 items
BAG DRAIN SIEMENS DORNER NS (MISCELLANEOUS) ×1 IMPLANT
BAG DRN NS LF (MISCELLANEOUS) ×1
BAG DRN RND TRDRP ANRFLXCHMBR (UROLOGICAL SUPPLIES) ×1
BAG URINE DRAIN 2000ML AR STRL (UROLOGICAL SUPPLIES) ×1 IMPLANT
CATH FOLEY 2WAY 18X30 (CATHETERS) IMPLANT
CATH FOLEY 2WAY SIL 18X30 (CATHETERS) ×1
DRAPE UTILITY 15X26 TOWEL STRL (DRAPES) ×1 IMPLANT
DRSG TELFA 3X4 N-ADH STERILE (GAUZE/BANDAGES/DRESSINGS) ×1 IMPLANT
GLOVE BIOGEL PI IND STRL 7.5 (GLOVE) ×1 IMPLANT
GOWN STRL REUS W/ TWL LRG LVL3 (GOWN DISPOSABLE) ×1 IMPLANT
GOWN STRL REUS W/TWL LRG LVL3 (GOWN DISPOSABLE) ×1
GOWN STRL REUS W/TWL XL LVL4 (GOWN DISPOSABLE) ×1 IMPLANT
GUIDEWIRE STR DUAL SENSOR (WIRE) IMPLANT
GUIDEWIRE STR ZIPWIRE 035X150 (MISCELLANEOUS) IMPLANT
IV NS IRRIG 3000ML ARTHROMATIC (IV SOLUTION) ×2 IMPLANT
KIT TURNOVER CYSTO (KITS) ×1 IMPLANT
LOOP CUT BIPOLAR 24F LRG (ELECTROSURGICAL) IMPLANT
PACK CYSTO AR (MISCELLANEOUS) ×1 IMPLANT
SET IRRIG Y TYPE TUR BLADDER L (SET/KITS/TRAYS/PACK) ×1 IMPLANT
SURGILUBE 2OZ TUBE FLIPTOP (MISCELLANEOUS) ×1 IMPLANT
SYR TOOMEY IRRIG 70ML (MISCELLANEOUS) ×1
SYRINGE TOOMEY IRRIG 70ML (MISCELLANEOUS) ×1 IMPLANT
WATER STERILE IRR 1000ML POUR (IV SOLUTION) IMPLANT

## 2022-11-29 NOTE — Interval H&P Note (Signed)
History and Physical Interval Note:  CV:RRR Lungs:clear  11/29/2022 7:25 AM  Ryan Dixon  has presented today for surgery, with the diagnosis of Bladder Tumor.  The various methods of treatment have been discussed with the patient and family. After consideration of risks, benefits and other options for treatment, the patient has consented to  Procedure(s): TRANSURETHRAL RESECTION OF BLADDER TUMOR (TURBT) (N/A) BLADDER INSTILLATION OF GEMCITABINE (N/A) as a surgical intervention.  The patient's history has been reviewed, patient examined, no change in status, stable for surgery.  I have reviewed the patient's chart and labs.  Questions were answered to the patient's satisfaction.     Ella Golomb C Merve Hotard

## 2022-11-29 NOTE — Anesthesia Postprocedure Evaluation (Signed)
Anesthesia Post Note  Patient: Ryan Dixon  Procedure(s) Performed: TRANSURETHRAL RESECTION OF BLADDER TUMOR (TURBT) BLADDER INSTILLATION OF GEMCITABINE  Patient location during evaluation: PACU Anesthesia Type: General Level of consciousness: awake and alert Pain management: pain level controlled Vital Signs Assessment: post-procedure vital signs reviewed and stable Respiratory status: spontaneous breathing, nonlabored ventilation, respiratory function stable and patient connected to nasal cannula oxygen Cardiovascular status: blood pressure returned to baseline and stable Postop Assessment: no apparent nausea or vomiting Anesthetic complications: no   No notable events documented.   Last Vitals:  Vitals:   11/29/22 0902 11/29/22 0915  BP: (!) 147/91 (!) 150/88  Pulse: 83 74  Resp: 16 19  Temp:    SpO2: 97% 93%    Last Pain:  Vitals:   11/29/22 0856  TempSrc:   PainSc: 0-No pain                 Louie Boston

## 2022-11-29 NOTE — Anesthesia Procedure Notes (Signed)
Procedure Name: Intubation Date/Time: 11/29/2022 7:41 AM  Performed by: Louie Boston, MDPre-anesthesia Checklist: Patient identified, Emergency Drugs available, Suction available and Patient being monitored Patient Re-evaluated:Patient Re-evaluated prior to induction Oxygen Delivery Method: Circle system utilized Preoxygenation: Pre-oxygenation with 100% oxygen Induction Type: IV induction Ventilation: Mask ventilation without difficulty Laryngoscope Size: McGraph and 3 Grade View: Grade I Tube type: Oral Tube size: 7.0 mm Number of attempts: 1 Airway Equipment and Method: Stylet and Oral airway Placement Confirmation: ETT inserted through vocal cords under direct vision, positive ETCO2, breath sounds checked- equal and bilateral and CO2 detector Secured at: 21 cm Tube secured with: Tape Dental Injury: Teeth and Oropharynx as per pre-operative assessment

## 2022-11-29 NOTE — Discharge Instructions (Addendum)
AMBULATORY SURGERY  DISCHARGE INSTRUCTIONS   The drugs that you were given will stay in your system until tomorrow so for the next 24 hours you should not:  Drive an automobile Make any legal decisions Drink any alcoholic beverage   You may resume regular meals tomorrow.  Today it is better to start with liquids and gradually work up to solid foods.  You may eat anything you prefer, but it is better to start with liquids, then soup and crackers, and gradually work up to solid foods.   Please notify your doctor immediately if you have any unusual bleeding, trouble breathing, redness and pain at the surgery site, drainage, fever, or pain not relieved by medication.    Additional Instructions:  Transurethral Resection of Bladder Tumor (TURBT) or Bladder Biopsy   Definition:  Transurethral Resection of the Bladder Tumor is a surgical procedure used to diagnose and remove tumors within the bladder.   General instructions:     Your recent bladder surgery requires very little post hospital care but some definite precautions.  Despite the fact that no skin incisions were used, the area around the bladder incisions are raw and covered with scabs to promote healing and prevent bleeding. Certain precautions are needed to insure that the scabs are not disturbed over the next 2-4 weeks while the healing proceeds.  Because the raw surface inside your bladder and the irritating effects of urine you may expect frequency of urination and/or urgency (a stronger desire to urinate) and perhaps even getting up at night more often. This will usually resolve or improve slowly over the healing period. You may see some blood in your urine over the first 6 weeks. Do not be alarmed, even if the urine was clear for a while. Get off your feet and drink lots of fluids until clearing occurs. If you start to pass clots or don't improve call us.  Diet:  You may return to your normal diet immediately. Because of  the raw surface of your bladder, alcohol, spicy foods, foods high in acid and drinks with caffeine may cause irritation or frequency and should be used in moderation. To keep your urine flowing freely and avoid constipation, drink plenty of fluids during the day (8-10 glasses). Tip: Avoid cranberry juice because it is very acidic.  Activity:  Your physical activity doesn't need to be restricted. However, if you are very active, you may see some blood in the urine. We suggest that you reduce your activity under the circumstances until the bleeding has stopped.  Bowels:  It is important to keep your bowels regular during the postoperative period. Straining with bowel movements can cause bleeding. A bowel movement every other day is reasonable. Use a mild laxative if needed, such as milk of magnesia 2-3 tablespoons, or 2 Dulcolax tablets. Call if you continue to have problems. If you had been taking narcotics for pain, before, during or after your surgery, you may be constipated. Take a laxative if necessary.    Medication:  You should resume your pre-surgery medications unless told not to.  Prescriptions for pain medication and medication to help with burning with urination were sent to your pharmacy   [facility] [address], [address] [place], [place] [address] [phone]        Please contact your physician with any problems or Same Day Surgery at [phone], Monday through Friday 6 am to 4 pm, or [place] [facility] number at [phone].

## 2022-11-29 NOTE — Transfer of Care (Signed)
Immediate Anesthesia Transfer of Care Note  Patient: Ryan Dixon  Procedure(s) Performed: TRANSURETHRAL RESECTION OF BLADDER TUMOR (TURBT) BLADDER INSTILLATION OF GEMCITABINE  Patient Location: PACU  Anesthesia Type:General  Level of Consciousness: awake, drowsy, and patient cooperative  Airway & Oxygen Therapy: Patient Spontanous Breathing and Patient connected to face mask oxygen  Post-op Assessment: Report given to RN and Post -op Vital signs reviewed and stable  Post vital signs: Reviewed and stable  Last Vitals:  Vitals Value Taken Time  BP 169/91 11/29/22 0856  Temp    Pulse 74 11/29/22 0856  Resp 20 11/29/22 0856  SpO2 100 % 11/29/22 0856  Vitals shown include unvalidated device data.  Last Pain:  Vitals:   11/29/22 0614  TempSrc: Temporal  PainSc: 0-No pain         Complications: No notable events documented.

## 2022-11-29 NOTE — Op Note (Signed)
   Preoperative diagnosis: Bladder tumor (3 cm)  Postoperative diagnosis:  Bladder tumor (3 cm)  Procedure:  Cystoscopy Transurethral resection of bladder tumor (2-5 cm) Instillation intravesical gemcitabine  Surgeon: Lorin Picket C. Airel Magadan, M.D.  Anesthesia: General  Complications: None  Intraoperative findings:  Cystoscopy-urethra normal in caliber without stricture.  Prostate with mild lateral lobe enlargement and moderate bladder neck elevation.  UOs normal-appearing bilaterally.  The right UO was anterior to the bladder tumor Bladder tumor: 3 cm papillary/nodular tumor superior to right UO and extending laterally; ~ 1 cm papillary tumor anterior bladder neck 10:00; 2-1 cm papillary tumors anterior bladder neck 2:00    EBL: Minimal  Specimens: Bladder tumor Base of bladder tumor Bladder neck tumors      Indication: Ryan Dixon 67 y.o. male with a history of intermittent gross hematuria and multifocal bladder tumors on office cystoscopy.. After reviewing the management options for treatment, he elected to proceed with the above surgical procedure(s). We have discussed the potential benefits and risks of the procedure, side effects of the proposed treatment, the likelihood of the patient achieving the goals of the procedure, and any potential problems that might occur during the procedure or recuperation. Informed consent has been obtained.  Description of procedure:  The patient was taken to the operating room and general anesthesia was induced.  The patient was placed in the dorsal lithotomy position, prepped and draped in the usual sterile fashion, and preoperative antibiotics were administered. A preoperative time-out was performed.   A 21 French cystoscope was lubricated, passed per urethra and advanced proximally under direct vision with findings as described above.  The cystoscope was removed and a 24 French continuous-flow resectoscope sheath with visual obturator was tight  at the urethral meatus and the urethra was dilated with [employer] sounds from 20-28 [place] without difficulty.  The resectoscope sheath was then easily advanced into the bladder without difficulty.  The [facility] resectoscope with loop was then placed into the sheath.  The bladder was then re-examined after the resectoscope was placed.  The bladder tumor was measured at >3 cm cm.  Using bipolar resection, the entire tumor was resected and removed for permanent pathologic analysis.   Separate resection was also taken of the underlying deep bladder tissue and sent as a separate specimen.  Hemostasis was obtained with cautery taking care to avoid the right UO.  The bladder neck tumors were then resected in their entirety and hemostasis was obtained with cautery.  The resectoscope was replaced with a laser bridge and a 0.038 Sensor wire was advanced in the right UO without difficulty.  Hemostasis was noted be adequate and the bladder was then emptied and an 18 [place] Foley catheter was placed with return of clear effluent upon irrigation.  2000 mg of intravesical gemcitabine was instilled to the bladder.  This was allowed to dwell in the PACU for 1 hour.  This was well-tolerated.  After 1 hour, the catheter was drained and removed.  The patient appeared to tolerate the procedure well and without complications.  After anesthetic reversal he was transported to the PACU in stable condition.   Plan: Foley catheter will be removed prior to discharge if urine clear-pink-tinged He will be notified with the pathology report   Orlandis Sanden C. Lonna Cobb,  MD

## 2022-11-29 NOTE — Anesthesia Preprocedure Evaluation (Addendum)
Anesthesia Evaluation  Patient identified by MRN, date of birth, ID band Patient awake    Reviewed: Allergy & Precautions, NPO status , Patient's Chart, lab work & pertinent test results  History of Anesthesia Complications Negative for: history of anesthetic complications  Airway Mallampati: II  TM Distance: >3 FB Neck ROM: full    Dental  (+) Partial Lower, Partial Upper   Pulmonary Current Smoker   Pulmonary exam normal        Cardiovascular hypertension, On Medications Normal cardiovascular exam     Neuro/Psych negative neurological ROS  negative psych ROS   GI/Hepatic negative GI ROS, Neg liver ROS,,,  Endo/Other  negative endocrine ROS    Renal/GU hematuria     Musculoskeletal   Abdominal   Peds  Hematology negative hematology ROS (+)   Anesthesia Other Findings Past Medical History: No date: Aortic atherosclerosis (HCC) No date: Chronic foot pain, left No date: History of hematuria No date: Hypertension No date: Other emphysema (HCC) No date: Other male erectile dysfunction No date: Pre-diabetes No date: Pure hypercholesterolemia No date: Tobacco dependence  Past Surgical History: 08/11/2020: ANKLE ARTHROSCOPY; Left     Comment:  Procedure: LEFT ANKLE ARTHROSCOPY AND DEBRIDEMENT;                Surgeon: Nadara Mustard, MD;  Location: [facility];  Service: Orthopedics;  Laterality: Left; No date: COLONOSCOPY No date: TONSILLECTOMY  BMI    Body Mass Index: 23.96 kg/m      Reproductive/Obstetrics negative OB ROS                             Anesthesia Physical Anesthesia Plan  ASA: 2  Anesthesia Plan: General ETT   Post-op Pain Management: Toradol IV (intra-op)* and Ofirmev IV (intra-op)*   Induction: Intravenous  PONV Risk Score and Plan: 2 and Ondansetron, Dexamethasone, Midazolam and Treatment may vary due to age or medical  condition  Airway Management Planned: Oral ETT  Additional Equipment:   Intra-op Plan:   Post-operative Plan: Extubation in OR  Informed Consent: I have reviewed the patients History and Physical, chart, labs and discussed the procedure including the risks, benefits and alternatives for the proposed anesthesia with the patient or authorized representative who has indicated his/her understanding and acceptance.     Dental Advisory Given  Plan Discussed with: Anesthesiologist, CRNA and Surgeon  Anesthesia Plan Comments: (Patient consented for risks of anesthesia including but not limited to:  - adverse reactions to medications - damage to eyes, teeth, lips or other oral mucosa - nerve damage due to positioning  - sore throat or hoarseness - Damage to heart, brain, nerves, lungs, other parts of body or loss of life  Patient voiced understanding.)        Anesthesia Quick Evaluation

## 2022-12-07 ENCOUNTER — Encounter: Payer: Self-pay | Admitting: Urology

## 2022-12-07 ENCOUNTER — Ambulatory Visit: Payer: Medicare Other | Admitting: Urology

## 2022-12-07 VITALS — BP 123/79 | HR 85 | Ht 70.0 in | Wt 166.2 lb

## 2022-12-07 DIAGNOSIS — C679 Malignant neoplasm of bladder, unspecified: Secondary | ICD-10-CM

## 2022-12-07 DIAGNOSIS — Z9889 Other specified postprocedural states: Secondary | ICD-10-CM

## 2022-12-07 NOTE — Progress Notes (Signed)
I, Duke Salvia, acting as a Neurosurgeon for [name], MD., have documented all relevant documentation on the behalf of [name], MD, as directed by  [name], MD while in the presence of [name], MD.   12/07/2022 10:35 AM   [name] 1955-06-18 [id number]  Referring provider: Marisue Ivan, MD [phone] [facility] [address] [facility] [place],  [place] [address]  Chief Complaint  Patient presents with   Hematuria    HPI: Ryan Dixon is a 67 y.o. male who presents for post-op follow-up.  Status post TURBT 11/29/2022 with findings of a 3 cm tumor superior to the right UO and extending laterally; he had a 1 cm papillary tumor of the anterior bladder neck at the 10 o'clock position and two 1 cm papillary tumors of the anterior bladder neck at the 2 o'clock position. All tumors were completely resected. Ryan pathology report is pending. He has had no post-operative problems and states he is voiding without problems.   PMH: Past Medical History:  Diagnosis Date   Hypertension     Surgical History: Past Surgical History:  Procedure Laterality Date   ANKLE ARTHROSCOPY Left 08/11/2020   Procedure: LEFT ANKLE ARTHROSCOPY AND DEBRIDEMENT;  Surgeon: Nadara Mustard, MD;  Location: [facility];  Service: Orthopedics;  Laterality: Left;   TONSILLECTOMY      Home Medications:  Allergies as of 09/21/2022       Reactions   Other Other (See Comments)   Anti-inflammatories cause GI upset, N/V        Medication List        Accurate as of September 21, 2022 10:35 AM. If you have any questions, ask your nurse or doctor.          atorvastatin 20 MG tablet Commonly known as: LIPITOR TAKE 1 TABLET BY MOUTH EVERY DAY AT NIGHT   hydrochlorothiazide 12.5 MG tablet Commonly known as: HYDRODIURIL Take 12.5 mg by mouth daily.   HYDROcodone-acetaminophen 5-325 MG tablet Commonly known as: NORCO/VICODIN Take 1 tablet by mouth every 4 (four)  hours as needed for moderate pain.   meloxicam 7.5 MG tablet Commonly known as: MOBIC Take 7.5 mg by mouth daily.   sildenafil 50 MG tablet Commonly known as: VIAGRA Take 50 mg by mouth daily as needed.        Allergies:  Allergies  Allergen Reactions   Other Other (See Comments)    Anti-inflammatories cause GI upset, N/V    Social History:  reports that he has been smoking cigarettes. He has a 86.00 pack-year smoking history. He has never used smokeless tobacco. He reports current alcohol use. He reports that he does not use drugs.   Physical Exam: BP 135/82   Pulse 98   Ht 5\' 10"  (1.778 m)   Wt 167 lb (75.8 kg)   BMI 23.96 kg/m   Constitutional:  Alert and oriented, No acute distress. HEENT: [place] AT Respiratory: Normal respiratory effort, no increased work of breathing. Neurologic: Grossly intact, no focal deficits, moving all 4 extremities. Psychiatric: Normal mood and affect.   Assessment & Plan:    1.  Status post TURBT Doing well I did discuss with Ryan Dixon and Ryan Dixon that Ryan endoscopic findings were consistent with urothelial carcinoma of the bladder We discussed that approximately 80% of bladder tumors are superficial       [facility] [address] [place], [place] [address] [phone]  [phone]  

## 2022-12-09 ENCOUNTER — Telehealth: Payer: Self-pay | Admitting: Urology

## 2022-12-09 NOTE — Telephone Encounter (Signed)
I contacted Ryan Dixon to discuss his bladder pathology report.  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  The discussed the recommended treatment for muscle invasive bladder cancer is radical cystectomy.  He inquired about other options and we discussed radiation/chemotherapy can be done but is not as effective as surgery.  He was also informed that this procedure is not performed in [place] and recommended referral to Ryan Dixon at [facility] in [place].  Other options including [facility] and [facility] were also discussed.  He will talk this over with his family and let me know where to send the referral.

## 2022-12-11 ENCOUNTER — Encounter: Payer: Self-pay | Admitting: Urology

## 2022-12-11 DIAGNOSIS — D494 Neoplasm of unspecified behavior of bladder: Secondary | ICD-10-CM

## 2023-01-02 ENCOUNTER — Inpatient Hospital Stay: Payer: Medicare Other | Attending: Internal Medicine | Admitting: Internal Medicine

## 2023-01-02 ENCOUNTER — Inpatient Hospital Stay: Payer: Medicare Other

## 2023-01-02 VITALS — BP 135/74 | HR 80 | Temp 98.6°F | Wt 166.6 lb

## 2023-01-02 DIAGNOSIS — C679 Malignant neoplasm of bladder, unspecified: Secondary | ICD-10-CM

## 2023-01-02 DIAGNOSIS — E7800 Pure hypercholesterolemia, unspecified: Secondary | ICD-10-CM | POA: Diagnosis not present

## 2023-01-02 DIAGNOSIS — C678 Malignant neoplasm of overlapping sites of bladder: Secondary | ICD-10-CM | POA: Diagnosis present

## 2023-01-02 DIAGNOSIS — F17210 Nicotine dependence, cigarettes, uncomplicated: Secondary | ICD-10-CM | POA: Insufficient documentation

## 2023-01-02 DIAGNOSIS — C7989 Secondary malignant neoplasm of other specified sites: Secondary | ICD-10-CM | POA: Diagnosis not present

## 2023-01-02 DIAGNOSIS — C689 Malignant neoplasm of urinary organ, unspecified: Secondary | ICD-10-CM | POA: Insufficient documentation

## 2023-01-02 DIAGNOSIS — I10 Essential (primary) hypertension: Secondary | ICD-10-CM | POA: Insufficient documentation

## 2023-01-02 DIAGNOSIS — Z08 Encounter for follow-up examination after completed treatment for malignant neoplasm: Secondary | ICD-10-CM

## 2023-01-02 LAB — CBC (CANCER CENTER ONLY)
HCT: 41.7 % (ref 39.0–52.0)
Hemoglobin: 14.2 g/dL (ref 13.0–17.0)
MCH: 32.5 pg (ref 26.0–34.0)
MCHC: 34.1 g/dL (ref 30.0–36.0)
MCV: 95.4 fL (ref 80.0–100.0)
Platelet Count: 315 10*3/uL (ref 150–400)
RBC: 4.37 MIL/uL (ref 4.22–5.81)
RDW: 12.8 % (ref 11.5–15.5)
WBC Count: 9.7 10*3/uL (ref 4.0–10.5)
nRBC: 0 % (ref 0.0–0.2)

## 2023-01-02 LAB — CMP (CANCER CENTER ONLY)
ALT: 27 U/L (ref 0–44)
AST: 24 U/L (ref 15–41)
Albumin: 4 g/dL (ref 3.5–5.0)
Alkaline Phosphatase: 71 U/L (ref 38–126)
Anion gap: 9 (ref 5–15)
BUN: 22 mg/dL (ref 8–23)
CO2: 29 mmol/L (ref 22–32)
Calcium: 8.7 mg/dL — ABNORMAL LOW (ref 8.9–10.3)
Chloride: 100 mmol/L (ref 98–111)
Creatinine: 1.18 mg/dL (ref 0.61–1.24)
GFR, Estimated: >60 mL/min (ref >60)
Glucose, Bld: 101 mg/dL — ABNORMAL HIGH (ref 70–99)
Potassium: 3.7 mmol/L (ref 3.5–5.1)
Sodium: 138 mmol/L (ref 135–145)
Total Bilirubin: 0.4 mg/dL (ref 0.3–1.2)
Total Protein: 7 g/dL (ref 6.5–8.1)

## 2023-01-02 NOTE — Progress Notes (Unsigned)
[facility] CONSULT NOTE  Patient Care Team: Marisue Ivan, MD as PCP - General (Family Medicine)  REFERRING PROVIDER: Dr. Lonna Cobb  REASON FOR REFFERAL: bladder cancer  CANCER STAGING   Cancer Staging  Bladder cancer [facility]) Staging form: Urinary Bladder, AJCC 8th Edition - Clinical stage from 12/07/2022: Stage II (cT2, cN0, cM0) - Signed by Michaelyn Barter, MD on 01/02/2023 Stage prefix: Initial diagnosis WHO/ISUP grade (low/high): High Grade Histologic grading system: 2 grade system   ASSESSMENT & PLAN:  Ryan Dixon 67 y.o. male with pmh of HTN, HLD, current smoker referred to Medical Oncology for management of bladder cancer.   # Bladder cancer, high grade, cT2 N0 M0 -Was seen by Dr. Lonna Cobb for intermittent hematuria.  Underwent cystoscopy with biopsy of mass close to right ureteral orifice, 2 masses from the bladder neck.  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  -CT chest abdomen pelvis was reviewed with no evidence of metastatic disease.  I will repeat his CT abdomen pelvis since it is over 2 months to get updated baseline scan.  I will also add a CT chest since the previous one was done without contrast.  -Discussed with the patient and wife in detail about the role of neoadjuvant chemotherapy followed by radical cystectomy then plus or minus nivolumab for 1 year depending on the residual disease.  Patient was evaluated by Dr. Berneice Heinrich, [facility] in [place].  Discussed about dose dense MVAC every 2 weeks for total 6 cycles versus cisplatin gemcitabine regimen every 3 weeks for 4 cycles.  His creatinine clearance is more than 60.  His ECOG status is 0-1.  Discussed about new data from VESPER trial. For patients with localized muscle-invasive bladder cancer (MIBC), dose-dense methotrexate, vinblastine, doxorubicin, and cisplatin (ddMVAC) and gemcitabine plus cisplatin (GC) are used as neoadjuvant  regimens prior to radical cystectomy, but data on overall survival (OS) are limited. In a randomized trial in almost 500 patients with localized MIBC, among the subgroup of 437 patients treated with neoadjuvant therapy, relative to gemcitabine plus cisplatin, dose-dense MVAC improved PFS (three-year PFS 66 versus 56 percent, HR 0.7, 95% CI 0.51-0.96, five-year PFS 61 versus 52 percent, HR 0.74, 95% CI 0.55-0.99, improved OS (five-year OS 66 versus 57 percent, HR 0.71, 95% CI 0.52-0.97), and improved disease-specific survival (DSS; five-year DSS 76 versus 62 percent, HR 0.55, 95% CI 0.39-0.78)  There was no significant difference in the pCR rate between the two chemotherapy regimens (42 versus 36 percent). However, dose-dense MVAC increased local control rates over gemcitabine plus cisplatin, including non-muscle-invasive (<ypT2pN0; 63 versus 49 percent) and organ-confined disease (<ypT3pN0; 77 versus 63 percent)   Improvement in the PFS and overall survival data was discussed.  However the downside is it is associated with higher toxicities and is more aggressive regimen. Grade ?3 toxicities were higher for dose-dense MVAC compared with gemcitabine plus cisplatin, including anemia (22 versus 8 percent), nausea and vomiting (10 versus 3 percent), and asthenia/generalized weakness (14 versus 4 percent)   Side effects were discussed in detail such as decrease in blood count, increased risk of infection, anemia, nausea, vomiting, fatigue, decrease in appetite, renal dysfunction, neurotoxicity, ototoxicity, cardiotoxicity, generalized weakness were discussed with the patient.  After lengthy discussion of neoadjuvant chemotherapy regimen, patient decided to proceed with dose dense MVAC.  I will obtain echo for baseline cardiac function prior to starting anthracyclines.  Will schedule for port placement.  Chemo class.  Labs today.  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   Orders Placed This Encounter  Procedures   IR IMAGING GUIDED PORT INSERTION    Standing Status:   Future    Standing Expiration Date:   01/02/2024    Order Specific Question:   Reason for Exam (SYMPTOM  OR DIAGNOSIS REQUIRED)    Answer:   new chemo    Order Specific Question:   Preferred Imaging Location?    Answer:   [facility]   CT ABDOMEN PELVIS W CONTRAST    Standing Status:   Future    Standing Expiration Date:   01/02/2024    Order Specific Question:   If indicated for the ordered procedure, I authorize the administration of contrast media per Radiology protocol    Answer:   Yes    Order Specific Question:   Does the patient have a contrast media/X-ray dye allergy?    Answer:   No    Order Specific Question:   Preferred imaging location?    Answer:   [facility]    Order Specific Question:   If indicated for the ordered procedure, I authorize the administration of oral contrast media per Radiology protocol    Answer:   Yes   CBC (Cancer Center Only)    Standing Status:   Future    Number of Occurrences:   1    Standing Expiration Date:   01/02/2024   CMP (Cancer Center only)    Standing Status:   Future    Number of Occurrences:   1    Standing Expiration Date:   01/02/2024   CBC with Differential (Cancer Center Only)    Standing Status:   Future    Standing Expiration Date:   01/23/2024   CMP (Cancer Center only)    Standing Status:   Future    Standing Expiration Date:   01/23/2024   Magnesium    Standing Status:   Future    Standing Expiration Date:   01/23/2024   ECHOCARDIOGRAM COMPLETE    Standing Status:   Future    Standing Expiration Date:   01/02/2024    Order Specific Question:   Where should this test be performed    Answer:   [facility]    Order Specific Question:   Please indicate who you request to read the nuc med / echo results.     Answer:   [facility]    Order Specific Question:   Perflutren DEFINITY (image enhancing agent) should be administered unless hypersensitivity or allergy exist    Answer:   Administer Perflutren    Order Specific Question:   Reason for exam-Echo    Answer:   Chemo  [id number]    Order Specific Question:   Release to patient    Answer:   Immediate    Order Specific Question:   Other Comments    Answer:   new chemo new baseline   RTC in 3 weeks for MD visit, labs, cycle 1 dose dense MVAC.  The total time spent in the appointment was 60 minutes encounter with patients including review of chart and various tests results, discussions about plan of care and coordination of care plan   All questions were answered. The patient knows to call the clinic with any problems, questions or concerns. No barriers to learning was detected.  Michaelyn Barter,  MD 7/23/202412:27 PM   HISTORY OF PRESENTING ILLNESS:  Ryan Dixon 67 y.o. male with pmh of HTN, HLD, current smoker referred to Medical Oncology for management of bladder cancer.   Patient was seen today accompanied with his wife. He has been feeling well overall.  Denies any hematuria.  Denies urinary symptoms.  He is very active with ECOG status of 0-1.  Continues to smoke.  We discussed about smoking cessation.  I have reviewed his chart and materials related to his cancer extensively and collaborated history with the patient. Summary of oncologic history is as follows: Oncology History  Bladder cancer (HCC)  10/25/2022 Initial Diagnosis   Seen by Dr. Lonna Cobb for intermittent hematuria since February 2024 along with other urinary symptoms.   10/28/2022 Imaging   CT hematuria workup Showed 2.2 cm bladder mass worrisome for cancer.  Adjacent mild diffuse bladder wall thickening.  Based on the location this may involve the extreme distal aspect of the right UVJ.  Extensive atherosclerotic changes with the occlusion of the right common iliac  artery and areas of significant stenosis along the left. Please correlate with any symptoms such as claudication and further workup as clinically appropriate.  CT chest with contrast IMPRESSION: 1. Lung-RADS 2, benign appearance or behavior. Continue annual screening with low-dose chest CT without contrast in 12 months. 2. Similar ascending aortic dilatation at 4.2 cm. Recommend attention on follow-up lung cancer screening CT in 1 year.   11/29/2022 Procedure   Intraoperative findings:  Cystoscopy-urethra normal in caliber without stricture.  Prostate with mild lateral lobe enlargement and moderate bladder neck elevation.  UOs normal-appearing bilaterally.  The right UO was anterior to the bladder tumor Bladder tumor: 3 cm papillary/nodular tumor superior to right UO and extending laterally; ~ 1 cm papillary tumor anterior bladder neck 10:00; 2-1 cm papillary tumors anterior bladder neck 2:00    11/29/2022 Pathology Results        12/07/2022 Cancer Staging   Staging form: Urinary Bladder, AJCC 8th Edition - Clinical stage from 12/07/2022: Stage II (cT2, cN0, cM0) - Signed by Michaelyn Barter, MD on 01/02/2023 Stage prefix: Initial diagnosis WHO/ISUP grade (low/high): High Grade Histologic grading system: 2 grade system   01/02/2023 Initial Diagnosis   Bladder cancer (HCC)   01/23/2023 -  Chemotherapy   Patient is on Treatment Plan : BLADDER DOSE DENSE MVAC q14d       MEDICAL HISTORY:  Past Medical History:  Diagnosis Date   Aortic atherosclerosis (HCC)    Chronic foot pain, left    History of hematuria    Hypertension    Other emphysema (HCC)    Other male erectile dysfunction    Pre-diabetes    Pure hypercholesterolemia    Tobacco dependence     SURGICAL HISTORY: Past Surgical History:  Procedure Laterality Date   ANKLE ARTHROSCOPY Left 08/11/2020   Procedure: LEFT ANKLE ARTHROSCOPY AND DEBRIDEMENT;  Surgeon: Nadara Mustard, MD;  Location: [facility];   Service: Orthopedics;  Laterality: Left;   BLADDER INSTILLATION N/A 11/29/2022   Procedure: BLADDER INSTILLATION OF GEMCITABINE;  Surgeon: [name], MD;  Location: [facility];  Service: Urology;  Laterality: N/A;   COLONOSCOPY     TONSILLECTOMY     TRANSURETHRAL RESECTION OF BLADDER TUMOR N/A 11/29/2022   Procedure: TRANSURETHRAL RESECTION OF BLADDER TUMOR (TURBT);  Surgeon: [name], MD;  Location: [facility];  Service: Urology;  Laterality: N/A;    SOCIAL HISTORY: Social History   Socioeconomic  History   Marital status: Married    Spouse name: Albin Felling   Number of children: Not on file   Years of education: Not on file   Highest education level: Not on file  Occupational History   Not on file  Tobacco Use   Smoking status: Every Day    Current packs/day: 2.00    Average packs/day: 2.0 packs/day for 43.0 years (86.0 ttl pk-yrs)    Types: Cigarettes   Smokeless tobacco: Never  Substance and Sexual Activity   Alcohol use: Yes    Comment: 1-2 beers/day   Drug use: Never   Sexual activity: Not on file  Other Topics Concern   Not on file  Social History Narrative   Not on file   Social Determinants of Health   Financial Resource Strain: Not on file  Food Insecurity: Not on file  Transportation Needs: Not on file  Physical Activity: Not on file  Stress: Not on file  Social Connections: Not on file  Intimate Partner Violence: Not on file    FAMILY HISTORY: No family history on file.  ALLERGIES:  is allergic to nsaids and other.  MEDICATIONS:  Current Outpatient Medications  Medication Sig Dispense Refill   atorvastatin (LIPITOR) 20 MG tablet TAKE 1 TABLET BY MOUTH EVERY DAY AT NIGHT     hydrochlorothiazide (HYDRODIURIL) 12.5 MG tablet Take 12.5 mg by mouth daily.     dexamethasone (DECADRON) 4 MG tablet Take 2 tablets (8 mg) by mouth daily x 3 days starting the day after cisplatin chemotherapy. Take with food. 30 tablet 1   HYDROcodone-acetaminophen  (NORCO/VICODIN) 5-325 MG tablet Take 1 tablet by mouth every 6 (six) hours as needed for moderate pain. (Patient not taking: Reported on 01/02/2023) 8 tablet 0   lidocaine-prilocaine (EMLA) cream Apply to affected area once 30 g 3   ondansetron (ZOFRAN) 8 MG tablet Take 1 tablet (8 mg total) by mouth every 8 (eight) hours as needed for nausea or vomiting. Start on the third day after cisplatin. 30 tablet 1   phenazopyridine (PYRIDIUM) 200 MG tablet Take 1 tablet (200 mg total) by mouth 3 (three) times daily as needed (burning with urination). (Patient not taking: Reported on 01/02/2023) 15 tablet 0   prochlorperazine (COMPAZINE) 10 MG tablet Take 1 tablet (10 mg total) by mouth every 6 (six) hours as needed (Nausea or vomiting). 30 tablet 1   No current facility-administered medications for this visit.    REVIEW OF SYSTEMS:   Pertinent information mentioned in HPI All other systems were reviewed with the patient and are negative.  PHYSICAL EXAMINATION: ECOG PERFORMANCE STATUS: 0 - Asymptomatic  Vitals:   01/02/23 1408  BP: 135/74  Pulse: 80  Temp: 98.6 F (37 C)  SpO2: 100%   Filed Weights   01/02/23 1408  Weight: 166 lb 9.6 oz (75.6 kg)    GENERAL:alert, no distress and comfortable SKIN: skin color, texture, turgor are normal, no rashes or significant lesions EYES: normal, conjunctiva are pink and non-injected, sclera clear OROPHARYNX:no exudate, no erythema and lips, buccal mucosa, and tongue normal  NECK: supple, thyroid normal size, non-tender, without nodularity LYMPH:  no palpable lymphadenopathy in the cervical, axillary or inguinal LUNGS: clear to auscultation and percussion with normal breathing effort HEART: regular rate & rhythm and no murmurs and no lower extremity edema ABDOMEN:abdomen soft, non-tender and normal bowel sounds Musculoskeletal:no cyanosis of digits and no clubbing  PSYCH: alert & oriented x 3 with fluent speech NEURO: no focal motor/sensory  deficits  LABORATORY DATA:  I have reviewed the data as listed Lab Results  Component Value Date   WBC 9.7 01/02/2023   HGB 14.2 01/02/2023   HCT 41.7 01/02/2023   MCV 95.4 01/02/2023   PLT 315 01/02/2023   Recent Labs    11/24/22 1006 01/02/23 1457  NA 138 138  K 3.6 3.7  CL 99 100  CO2 29 29  GLUCOSE 98 101*  BUN 19 22  CREATININE 1.21 1.18  CALCIUM 9.1 8.7*  GFRNONAA >60 >60  PROT  --  7.0  ALBUMIN  --  4.0  AST  --  24  ALT  --  27  ALKPHOS  --  71  BILITOT  --  0.4    RADIOGRAPHIC STUDIES: I have personally reviewed the radiological images as listed and agreed with the findings in the report. No results found.

## 2023-01-03 ENCOUNTER — Other Ambulatory Visit: Payer: Self-pay | Admitting: *Deleted

## 2023-01-03 ENCOUNTER — Encounter: Payer: Self-pay | Admitting: Internal Medicine

## 2023-01-03 ENCOUNTER — Telehealth: Payer: Self-pay | Admitting: *Deleted

## 2023-01-03 DIAGNOSIS — C679 Malignant neoplasm of bladder, unspecified: Secondary | ICD-10-CM

## 2023-01-03 DIAGNOSIS — C689 Malignant neoplasm of urinary organ, unspecified: Secondary | ICD-10-CM

## 2023-01-03 DIAGNOSIS — Z08 Encounter for follow-up examination after completed treatment for malignant neoplasm: Secondary | ICD-10-CM

## 2023-01-03 DIAGNOSIS — I745 Embolism and thrombosis of iliac artery: Secondary | ICD-10-CM

## 2023-01-03 MED ORDER — ONDANSETRON HCL 8 MG PO TABS
8.0000 mg | ORAL_TABLET | Freq: Three times a day (TID) | ORAL | 1 refills | Status: DC | PRN
Start: 2023-01-03 — End: 2023-07-03

## 2023-01-03 MED ORDER — LIDOCAINE-PRILOCAINE 2.5-2.5 % EX CREA
TOPICAL_CREAM | CUTANEOUS | 3 refills | Status: DC
Start: 2023-01-03 — End: 2023-07-03

## 2023-01-03 MED ORDER — DEXAMETHASONE 4 MG PO TABS
ORAL_TABLET | ORAL | 1 refills | Status: DC
Start: 2023-01-03 — End: 2023-07-03

## 2023-01-03 MED ORDER — PROCHLORPERAZINE MALEATE 10 MG PO TABS
10.0000 mg | ORAL_TABLET | Freq: Four times a day (QID) | ORAL | 1 refills | Status: DC | PRN
Start: 2023-01-03 — End: 2023-07-03

## 2023-01-03 NOTE — Progress Notes (Signed)
START ON PATHWAY REGIMEN - Bladder     A cycle is every 14 days:     Methotrexate      Vinblastine      Doxorubicin      Cisplatin      Pegfilgrastim-xxxx   **Always confirm dose/schedule in your pharmacy ordering system**  Patient Characteristics: Pre-Cystectomy or Nonsurgical Candidate, M0 (Clinical Staging), cT2-4a, cN0-1, M0, Cystectomy Eligible, Cisplatin-Based Chemotherapy Indicated with CrCl ? 60 mL/min and Minimal or No Symptoms Therapeutic Status: Pre-Cystectomy or Nonsurgical Candidate, M0 (Clinical Staging) AJCC M Category: cM0 AJCC 8 Stage Grouping: II AJCC T Category: cT2 AJCC N Category: cN0 Intent of Therapy: Curative Intent, Discussed with Patient 

## 2023-01-03 NOTE — Telephone Encounter (Signed)
Pt notified of port placement appt on Friday January 20, 2023 at 130pm.  RN instructed pt to arrive at the [facility] at 1230 pm.  Pt also instructed to have nothing to eat or drink 8 hours prior to procedure and he would need someone to drive him home.  Pt verbalized understanding.

## 2023-01-04 ENCOUNTER — Other Ambulatory Visit: Payer: Self-pay

## 2023-01-06 ENCOUNTER — Ambulatory Visit
Admission: RE | Admit: 2023-01-06 | Discharge: 2023-01-06 | Disposition: A | Discharge status: Home / Self Care | Payer: Medicare Other | Source: Ambulatory Visit | Attending: Internal Medicine | Admitting: Internal Medicine

## 2023-01-06 ENCOUNTER — Ambulatory Visit: Admission: RE | Admit: 2023-01-06 | Payer: Medicare Other | Source: Ambulatory Visit

## 2023-01-06 DIAGNOSIS — C679 Malignant neoplasm of bladder, unspecified: Secondary | ICD-10-CM | POA: Diagnosis present

## 2023-01-06 DIAGNOSIS — C689 Malignant neoplasm of urinary organ, unspecified: Secondary | ICD-10-CM

## 2023-01-06 MED ORDER — IOHEXOL 300 MG/ML  SOLN
100.0000 mL | Freq: Once | INTRAMUSCULAR | Status: AC | PRN
  Administered 2023-01-06: 100 mL via INTRAVENOUS

## 2023-01-09 ENCOUNTER — Inpatient Hospital Stay: Payer: Medicare Other

## 2023-01-17 ENCOUNTER — Encounter: Payer: Self-pay | Admitting: Internal Medicine

## 2023-01-17 NOTE — Progress Notes (Signed)
Pharmacist Chemotherapy Monitoring - Initial Assessment    Anticipated start date: 01/23/23   The following has been reviewed per standard work regarding the patient's treatment regimen: The patient's diagnosis, treatment plan and drug doses, and organ/hematologic function Lab orders and baseline tests specific to treatment regimen  The treatment plan start date, drug sequencing, and pre-medications Prior authorization status  Patient's documented medication list, including drug-drug interaction screen and prescriptions for anti-emetics and supportive care specific to the treatment regimen The drug concentrations, fluid compatibility, administration routes, and timing of the medications to be used The patient's access for treatment and lifetime cumulative dose history, if applicable  The patient's medication allergies and previous infusion related reactions, if applicable   Changes made to treatment plan:  N/A  Follow up needed:  Pending ECHO (8/7) Pending port placement (8/9)   Ebony Hail, Pharm.D., CPP 01/17/2023@3 :50 PM

## 2023-01-18 ENCOUNTER — Inpatient Hospital Stay: Payer: Medicare Other | Attending: Internal Medicine | Admitting: Hospice and Palliative Medicine

## 2023-01-18 ENCOUNTER — Ambulatory Visit
Admission: RE | Admit: 2023-01-18 | Discharge: 2023-01-18 | Disposition: A | Discharge status: Home / Self Care | Payer: Medicare Other | Source: Ambulatory Visit | Attending: Internal Medicine | Admitting: Internal Medicine

## 2023-01-18 DIAGNOSIS — I358 Other nonrheumatic aortic valve disorders: Secondary | ICD-10-CM | POA: Diagnosis not present

## 2023-01-18 DIAGNOSIS — Z0189 Encounter for other specified special examinations: Secondary | ICD-10-CM | POA: Diagnosis not present

## 2023-01-18 DIAGNOSIS — Z08 Encounter for follow-up examination after completed treatment for malignant neoplasm: Secondary | ICD-10-CM

## 2023-01-18 DIAGNOSIS — C679 Malignant neoplasm of bladder, unspecified: Secondary | ICD-10-CM | POA: Diagnosis present

## 2023-01-18 DIAGNOSIS — C689 Malignant neoplasm of urinary organ, unspecified: Secondary | ICD-10-CM

## 2023-01-18 DIAGNOSIS — Z5111 Encounter for antineoplastic chemotherapy: Secondary | ICD-10-CM | POA: Insufficient documentation

## 2023-01-18 DIAGNOSIS — Z5189 Encounter for other specified aftercare: Secondary | ICD-10-CM | POA: Insufficient documentation

## 2023-01-18 DIAGNOSIS — F17210 Nicotine dependence, cigarettes, uncomplicated: Secondary | ICD-10-CM | POA: Insufficient documentation

## 2023-01-18 DIAGNOSIS — Z8551 Personal history of malignant neoplasm of bladder: Secondary | ICD-10-CM

## 2023-01-18 DIAGNOSIS — C675 Malignant neoplasm of bladder neck: Secondary | ICD-10-CM | POA: Insufficient documentation

## 2023-01-18 LAB — ECHOCARDIOGRAM COMPLETE
AR max vel: 1.98 cm2
AV Area VTI: 1.76 cm2
AV Area mean vel: 1.75 cm2
AV Mean grad: 6 mmHg
AV Peak grad: 12.5 mmHg
Ao pk vel: 1.77 m/s
Area-P 1/2: 3.33 cm2
MV VTI: 2.26 cm2
S' Lateral: 2.7 cm

## 2023-01-18 MED ORDER — PERFLUTREN LIPID MICROSPHERE
1.0000 mL | INTRAVENOUS | Status: AC | PRN
  Administered 2023-01-18: 5 mL via INTRAVENOUS

## 2023-01-18 NOTE — Progress Notes (Signed)
Multidisciplinary Oncology Council Documentation  Ryan Dixon was presented by our [facility] on 01/18/2023, which included representatives from:  Palliative Care Dietitian  Physical/Occupational Therapist Nurse Navigator Genetics Social work Survivorship RN Financial Navigator Research RN   Tanush currently presents with history of urothelial cancer   We reviewed previous medical and familial history, history of present illness, and recent lab results along with all available histopathologic and imaging studies. The MOC considered available treatment options and made the following recommendations/referrals:  Nutrition, SW  The MOC is a meeting of clinicians from various specialty areas who evaluate and discuss patients for whom a multidisciplinary approach is being considered. Final determinations in the plan of care are those of the provider(s).   Today's extended care, comprehensive team conference, Ezeriah was not present for the discussion and was not examined.

## 2023-01-18 NOTE — Progress Notes (Signed)
*  PRELIMINARY RESULTS* Echocardiogram 2D Echocardiogram has been performed.  Carolyne Fiscal 01/18/2023, 10:16 AM

## 2023-01-19 ENCOUNTER — Other Ambulatory Visit: Payer: Self-pay | Admitting: Radiology

## 2023-01-19 NOTE — Progress Notes (Signed)
Patient for IR Port Insertion on Friday 01/20/2023, I called and spoke with the patient on the phone and gave pre-procedure instructions. Pt was made aware to be here at 12:30p, NPO after MN prior to procedure as well as driver post procedure/recovery/discharge. Pt stated understanding.  Called 01/19/23

## 2023-01-20 ENCOUNTER — Other Ambulatory Visit: Payer: Self-pay

## 2023-01-20 ENCOUNTER — Ambulatory Visit
Admission: RE | Admit: 2023-01-20 | Discharge: 2023-01-20 | Disposition: A | Discharge status: Home / Self Care | Payer: Medicare Other | Source: Ambulatory Visit | Attending: Internal Medicine | Admitting: Internal Medicine

## 2023-01-20 ENCOUNTER — Encounter: Payer: Self-pay | Admitting: Radiology

## 2023-01-20 DIAGNOSIS — C689 Malignant neoplasm of urinary organ, unspecified: Secondary | ICD-10-CM | POA: Insufficient documentation

## 2023-01-20 DIAGNOSIS — C679 Malignant neoplasm of bladder, unspecified: Secondary | ICD-10-CM

## 2023-01-20 DIAGNOSIS — F17210 Nicotine dependence, cigarettes, uncomplicated: Secondary | ICD-10-CM | POA: Diagnosis not present

## 2023-01-20 DIAGNOSIS — R7303 Prediabetes: Secondary | ICD-10-CM | POA: Diagnosis not present

## 2023-01-20 DIAGNOSIS — I10 Essential (primary) hypertension: Secondary | ICD-10-CM | POA: Insufficient documentation

## 2023-01-20 DIAGNOSIS — I700 Atherosclerosis of aorta: Secondary | ICD-10-CM | POA: Insufficient documentation

## 2023-01-20 DIAGNOSIS — J432 Centrilobular emphysema: Secondary | ICD-10-CM | POA: Diagnosis not present

## 2023-01-20 HISTORY — PX: IR IMAGING GUIDED PORT INSERTION: IMG5740

## 2023-01-20 MED ORDER — MIDAZOLAM HCL 2 MG/2ML IJ SOLN
INTRAMUSCULAR | Status: AC
  Filled 2023-01-20: qty 2

## 2023-01-20 MED ORDER — FENTANYL CITRATE (PF) 100 MCG/2ML IJ SOLN
INTRAMUSCULAR | Status: AC | PRN
  Administered 2023-01-20 (×2): 50 ug via INTRAVENOUS

## 2023-01-20 MED ORDER — MIDAZOLAM HCL 5 MG/5ML IJ SOLN
INTRAMUSCULAR | Status: AC | PRN
  Administered 2023-01-20 (×2): 1 mg via INTRAVENOUS

## 2023-01-20 MED ORDER — LIDOCAINE-EPINEPHRINE 1 %-1:100000 IJ SOLN
20.0000 mL | Freq: Once | INTRAMUSCULAR | Status: AC
  Administered 2023-01-20: 15 mL via INTRADERMAL

## 2023-01-20 MED ORDER — SODIUM CHLORIDE 0.9 % IV SOLN: INTRAVENOUS | Status: DC

## 2023-01-20 MED ORDER — HEPARIN SOD (PORK) LOCK FLUSH 100 UNIT/ML IV SOLN
500.0000 [IU] | Freq: Once | INTRAVENOUS | Status: AC
  Administered 2023-01-20: 500 [IU] via INTRAVENOUS

## 2023-01-20 MED ORDER — LIDOCAINE-EPINEPHRINE 1 %-1:100000 IJ SOLN
INTRAMUSCULAR | Status: AC
  Filled 2023-01-20: qty 1

## 2023-01-20 MED ORDER — HEPARIN SOD (PORK) LOCK FLUSH 100 UNIT/ML IV SOLN
INTRAVENOUS | Status: AC
  Filled 2023-01-20: qty 5

## 2023-01-20 MED ORDER — FENTANYL CITRATE (PF) 100 MCG/2ML IJ SOLN
INTRAMUSCULAR | Status: AC
  Filled 2023-01-20: qty 2

## 2023-01-20 MED FILL — Dexamethasone Sodium Phosphate Inj 100 MG/10ML: INTRAMUSCULAR | Qty: 1 | Status: AC

## 2023-01-20 NOTE — Procedures (Signed)
Interventional Radiology Procedure Note  Date of Procedure: 01/20/2023  Procedure: Chest port placement   Findings:  1. Right chest port placement    Complications: No immediate complications noted.   Estimated Blood Loss: minimal  Follow-up and Recommendations: 1. Ready for use    Olive Bass, MD  Vascular & Interventional Radiology  01/20/2023 2:43 PM

## 2023-01-20 NOTE — H&P (Signed)
Chief Complaint: Patient was seen in consultation today for urothelial cancer  Referring Physician(s): Ryan Dixon  Supervising Physician: Ryan Dixon  Patient Status: [facility] - Out-pt  History of Present Illness: Ryan Dixon is a 67 y.o. male with PMH significant for aortic atherosclerosis, hypertension, emphysema, pre-diabetes, and tobacco dependence being seen today in relation to urothelial cancer. Patient was recently diagnosed with cancer and is under the care of Ryan Dixon from Oncology service. Port placement has been requested to facilitate chemotherapy.   Past Medical History:  Diagnosis Date   Aortic atherosclerosis (HCC)    Chronic foot pain, left    History of hematuria    Hypertension    Other emphysema (HCC)    Other male erectile dysfunction    Pre-diabetes    Pure hypercholesterolemia    Tobacco dependence     Past Surgical History:  Procedure Laterality Date   ANKLE ARTHROSCOPY Left 08/11/2020   Procedure: LEFT ANKLE ARTHROSCOPY AND DEBRIDEMENT;  Surgeon: Ryan Mustard, Dixon;  Location: [facility];  Service: Orthopedics;  Laterality: Left;   BLADDER INSTILLATION N/A 11/29/2022   Procedure: BLADDER INSTILLATION OF GEMCITABINE;  Surgeon: Ryan Dixon;  Location: [facility];  Service: Urology;  Laterality: N/A;   COLONOSCOPY     TONSILLECTOMY     TRANSURETHRAL RESECTION OF BLADDER TUMOR N/A 11/29/2022   Procedure: TRANSURETHRAL RESECTION OF BLADDER TUMOR (TURBT);  Surgeon: Ryan Dixon;  Location: [facility];  Service: Urology;  Laterality: N/A;    Allergies: Nsaids and Other  Medications: Prior to Admission medications   Medication Sig Start Date End Date Taking? Authorizing Provider  atorvastatin (LIPITOR) 20 MG tablet TAKE 1 TABLET BY MOUTH EVERY DAY AT NIGHT 06/27/22  Yes Provider, Historical, Dixon  hydrochlorothiazide (HYDRODIURIL) 12.5 MG tablet Take 12.5 mg by mouth daily. 03/23/20  Yes Provider, Historical, Dixon   dexamethasone (DECADRON) 4 MG tablet Take 2 tablets (8 mg) by mouth daily x 3 days starting the day after cisplatin chemotherapy. Take with food. 01/03/23   Ryan Barter, Dixon  HYDROcodone-acetaminophen (NORCO/VICODIN) 5-325 MG tablet Take 1 tablet by mouth every 6 (six) hours as needed for moderate pain. Patient not taking: Reported on 01/02/2023 11/29/22   Ryan Dixon  lidocaine-prilocaine (EMLA) cream Apply to affected area once 01/03/23   Ryan Barter, Dixon  ondansetron (ZOFRAN) 8 MG tablet Take 1 tablet (8 mg total) by mouth every 8 (eight) hours as needed for nausea or vomiting. Start on the third day after cisplatin. 01/03/23   Ryan Barter, Dixon  phenazopyridine (PYRIDIUM) 200 MG tablet Take 1 tablet (200 mg total) by mouth 3 (three) times daily as needed (burning with urination). Patient not taking: Reported on 01/02/2023 11/29/22   Ryan Dixon  prochlorperazine (COMPAZINE) 10 MG tablet Take 1 tablet (10 mg total) by mouth every 6 (six) hours as needed (Nausea or vomiting). 01/03/23   Ryan Barter, Dixon     History reviewed. No pertinent family history.  Social History   Socioeconomic History   Marital status: Married    Spouse name: Ryan Dixon   Number of children: Not on file   Years of education: Not on file   Highest education level: Not on file  Occupational History   Not on file  Tobacco Use   Smoking status: Every Day    Current packs/day: 2.00    Average packs/day: 2.0 packs/day for 43.0 years (86.0 ttl pk-yrs)    Types: Cigarettes  Smokeless tobacco: Never  Substance and Sexual Activity   Alcohol use: Yes    Comment: 1-2 beers/day   Drug use: Never   Sexual activity: Not on file  Other Topics Concern   Not on file  Social History Narrative   Not on file   Social Determinants of Health   Financial Resource Strain: Not on file  Food Insecurity: Not on file  Transportation Needs: Not on file  Physical Activity: Not on file  Stress: Not on file   Social Connections: Not on file    Code Status: Full code  Review of Systems: A 12 point ROS discussed and pertinent positives are indicated in the HPI above.  All other systems are negative.  Review of Systems  Constitutional:  Negative for chills and fever.  Respiratory:  Negative for chest tightness and shortness of breath.   Cardiovascular:  Negative for chest pain and leg swelling.  Gastrointestinal:  Negative for abdominal pain, diarrhea, nausea and vomiting.  Neurological:  Negative for dizziness and headaches.  Psychiatric/Behavioral:  Negative for confusion.     Vital Signs: BP (!) 149/85 (BP Location: Left Arm)   Pulse 75   Resp 13   Ht 5\' 10"  (1.778 m)   Wt 165 lb (74.8 kg)   SpO2 100%   BMI 23.68 kg/m   Advance Care Plan: The advanced care plan/surrogate decision maker was discussed at the time of visit and documented in the medical record.  Patient identifies his wife, Ryan Dixon, as Runner, broadcasting/film/video.   Physical Exam Vitals reviewed.  Constitutional:      General: He is not in acute distress. Cardiovascular:     Rate and Rhythm: Normal rate and regular rhythm.     Pulses: Normal pulses.     Heart sounds: Normal heart sounds.  Pulmonary:     Effort: Pulmonary effort is normal.     Breath sounds: Normal breath sounds.  Abdominal:     Palpations: Abdomen is soft.     Tenderness: There is no abdominal tenderness.  Musculoskeletal:     Right lower leg: No edema.     Left lower leg: No edema.  Skin:    General: Skin is warm and dry.  Neurological:     Mental Status: He is alert and oriented to person, place, and time.  Psychiatric:        Mood and Affect: Mood normal.        Behavior: Behavior normal.     Imaging: ECHOCARDIOGRAM COMPLETE  Result Date: 01/18/2023    ECHOCARDIOGRAM REPORT   Patient Name:   Ryan Dixon Date of Exam: 01/18/2023 Medical Rec #:  [id number]    Height:       70.0 in Accession #:    [id number]   Weight:       166.6 lb  Date of Birth:  February 14, 1956     BSA:          1.931 m Patient Age:    67 years     BP:           135/74 mmHg Patient Gender: M            HR:           75 bpm. Exam Location:  [facility] Procedure: 2D Echo, 3D Echo, Cardiac Doppler, Color Doppler, Strain Analysis and            Intracardiac Opacification Agent Indications:     Chemo  History:  Patient has no prior history of Echocardiogram examinations.                  Bladder CA.  Sonographer:     Mikki Harbor Referring Phys:  [id number] Ryan Dixon Diagnosing Phys: Yvonne Kendall Dixon  Sonographer Comments: Global longitudinal strain was attempted. IMPRESSIONS  1. Left ventricular ejection fraction, by estimation, is 55 to 60%. The left ventricle has normal function. The left ventricle has no regional wall motion abnormalities. Left ventricular diastolic parameters were normal.  2. Right ventricular systolic function is normal. The right ventricular size is normal. There is normal pulmonary artery systolic pressure.  3. The mitral valve is normal in structure. Trivial mitral valve regurgitation. No evidence of mitral stenosis.  4. The aortic valve has an indeterminant number of cusps. There is mild calcification of the aortic valve. There is moderate thickening of the aortic valve. Aortic valve regurgitation is not visualized. Aortic valve sclerosis is present, with no evidence of aortic valve stenosis.  5. The inferior vena cava is normal in size with greater than 50% respiratory variability, suggesting right atrial pressure of 3 mmHg. FINDINGS  Left Ventricle: Left ventricular ejection fraction, by estimation, is 55 to 60%. The left ventricle has normal function. The left ventricle has no regional wall motion abnormalities. Definity contrast agent was given IV to delineate the left ventricular  endocardial borders. The left ventricular internal cavity size was normal in size. There is no left ventricular hypertrophy. Left ventricular diastolic parameters were  normal. Right Ventricle: The right ventricular size is normal. No increase in right ventricular wall thickness. Right ventricular systolic function is normal. There is normal pulmonary artery systolic pressure. The tricuspid regurgitant velocity is 2.45 m/s, and  with an assumed right atrial pressure of 3 mmHg, the estimated right ventricular systolic pressure is 27.0 mmHg. Left Atrium: Left atrial size was normal in size. Right Atrium: Right atrial size was normal in size. Pericardium: There is no evidence of pericardial effusion. Mitral Valve: The mitral valve is normal in structure. Trivial mitral valve regurgitation. No evidence of mitral valve stenosis. MV peak gradient, 3.6 mmHg. The mean mitral valve gradient is 2.0 mmHg. Tricuspid Valve: The tricuspid valve is normal in structure. Tricuspid valve regurgitation is trivial. Aortic Valve: The aortic valve has an indeterminant number of cusps. There is mild calcification of the aortic valve. There is moderate thickening of the aortic valve. Aortic valve regurgitation is not visualized. Aortic valve sclerosis is present, with no evidence of aortic valve stenosis. Aortic valve mean gradient measures 6.0 mmHg. Aortic valve peak gradient measures 12.5 mmHg. Aortic valve area, by VTI measures 1.76 cm. Pulmonic Valve: The pulmonic valve was normal in structure. Pulmonic valve regurgitation is not visualized. No evidence of pulmonic stenosis. Aorta: The aortic root and ascending aorta are structurally normal, with no evidence of dilitation. Pulmonary Artery: The pulmonary artery is of normal size. Venous: The inferior vena cava is normal in size with greater than 50% respiratory variability, suggesting right atrial pressure of 3 mmHg. IAS/Shunts: The interatrial septum appears to be lipomatous. The interatrial septum was not well visualized.  LEFT VENTRICLE PLAX 2D LVIDd:         4.50 cm   Diastology LVIDs:         2.70 cm   LV e' medial:    9.25 cm/s LV PW:          1.00 cm   LV E/e' medial:  11.1 LV IVS:  1.02 cm   LV e' lateral:   12.90 cm/s LVOT diam:     2.10 cm   LV E/e' lateral: 8.0 LV SV:         71 LV SV Index:   37        2D Longitudinal Strain LVOT Area:     3.46 cm  2D Strain GLS Avg:     -16.8 %  RIGHT VENTRICLE RV Basal diam:  3.55 cm RV Mid diam:    2.90 cm RV S prime:     11.50 cm/s TAPSE (M-mode): 2.2 cm LEFT ATRIUM             Index        RIGHT ATRIUM           Index LA diam:        2.80 cm 1.45 cm/m   RA Area:     12.90 cm LA Vol (A2C):   35.4 ml 18.33 ml/m  RA Volume:   30.90 ml  16.00 ml/m LA Vol (A4C):   29.5 ml 15.27 ml/m LA Biplane Vol: 33.2 ml 17.19 ml/m  AORTIC VALVE                     PULMONIC VALVE AV Area (Vmax):    1.98 cm      PV Vmax:       1.24 m/s AV Area (Vmean):   1.75 cm      PV Peak grad:  6.2 mmHg AV Area (VTI):     1.76 cm AV Vmax:           177.00 cm/s AV Vmean:          110.000 cm/s AV VTI:            0.405 m AV Peak Grad:      12.5 mmHg AV Mean Grad:      6.0 mmHg LVOT Vmax:         101.00 cm/s LVOT Vmean:        55.500 cm/s LVOT VTI:          0.206 m LVOT/AV VTI ratio: 0.51  AORTA Ao Root diam: 3.60 cm Ao Asc diam:  3.50 cm MITRAL VALVE                TRICUSPID VALVE MV Area (PHT): 3.33 cm     TR Peak grad:   24.0 mmHg MV Area VTI:   2.26 cm     TR Vmax:        245.00 cm/s MV Peak grad:  3.6 mmHg MV Mean grad:  2.0 mmHg     SHUNTS MV Vmax:       0.95 m/s     Systemic VTI:  0.21 m MV Vmean:      57.7 cm/s    Systemic Diam: 2.10 cm MV Decel Time: 228 msec MV E velocity: 103.00 cm/s MV A velocity: 102.00 cm/s MV E/A ratio:  1.01 Cristal [place] Dixon Electronically signed by Yvonne Kendall Dixon Signature Date/Time: 01/18/2023/4:10:36 PM    Final    CT CHEST ABDOMEN PELVIS W CONTRAST  Result Date: 01/06/2023 CLINICAL DATA:  History of bladder cancer, staging. * Tracking Code: BO * EXAM: CT CHEST, ABDOMEN, AND PELVIS WITH CONTRAST TECHNIQUE: Multidetector CT imaging of the chest, abdomen and pelvis was performed following  the standard protocol during bolus administration of intravenous contrast. RADIATION DOSE REDUCTION: This exam was performed according to the departmental dose-optimization program  which includes automated exposure control, adjustment of the mA and/or kV according to patient size and/or use of iterative reconstruction technique. 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 Aortic atherosclerosis. No central pulmonary embolus on this nondedicated study. Mediastinum/Nodes: No pathologically enlarged mediastinal, hilar or axillary lymph nodes no suspicious thyroid nodule. The esophagus is grossly unremarkable Lungs/Pleura: No suspicious pulmonary nodules or masses calcified granulomas. Mild diffuse bronchial wall thickening centrilobular and paraseptal emphysema. No pleural effusion. No pneumothorax. Musculoskeletal: Subcutaneous cystic lesion in the paramedian upper back measuring 19 mm on image 9/2 is compatible with a sebaceous cyst no aggressive lytic or blastic lesion of bone. CT ABDOMEN PELVIS FINDINGS Hepatobiliary: No suspicious hepatic lesion. Gallbladder is unremarkable. No biliary ductal dilation. Pancreas: No pancreatic ductal dilation or evidence of acute inflammation. Spleen: No splenomegaly. Adrenals/Urinary Tract: Bilateral adrenal glands appear normal. No hydronephrosis. Kidneys demonstrate symmetric enhancement. Nodular wall thickening along the posterior urinary bladder on image 111/2. Stomach/Bowel: No radiopaque enteric contrast material was administered. Stomach is unremarkable for degree of distension. No pathologic dilation of small or large bowel. Colonic diverticulosis without findings of acute diverticulitis. Vascular/Lymphatic: Normal caliber abdominal aorta. Aortic atherosclerosis. Smooth IVC contours. The portal,  splenic and superior mesenteric veins are patent. No pathologically enlarged abdominal or pelvic lymph nodes. Reproductive: Prostate is unremarkable. Other: Fat containing small right inguinal hernia. Left inguinal hernia contains fat and nonobstructed portion of sigmoid colon. Fat containing paraumbilical hernia. Musculoskeletal: No aggressive lytic or blastic lesion of bone. Multilevel degenerative changes spine. Degenerative change of the bilateral hips. IMPRESSION: 1. Nodular wall thickening along the posterior urinary bladder, compatible with known bladder cancer. 2. No evidence of metastatic disease in the chest, abdomen or pelvis. 3. 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 Attention on follow-up oncologic surveillance imaging. 4. Aortic Atherosclerosis (ICD10-I70.0) and Emphysema (ICD10-J43.9). Electronically Signed   By: Maudry Mayhew M.D.   On: 01/06/2023 16:09    Labs:  CBC: Recent Labs    11/24/22 1006 01/02/23 1457  WBC 8.4 9.7  HGB 14.2 14.2  HCT 42.0 41.7  PLT 393 315    COAGS: No results for input(s): "INR", "APTT" in the last 8760 hours.  BMP: Recent Labs    11/24/22 1006 01/02/23 1457  NA 138 138  K 3.6 3.7  CL 99 100  CO2 29 29  GLUCOSE 98 101*  BUN 19 22  CALCIUM 9.1 8.7*  CREATININE 1.21 1.18  GFRNONAA >60 >60    LIVER FUNCTION TESTS: Recent Labs    01/02/23 1457  BILITOT 0.4  AST 24  ALT 27  ALKPHOS 71  PROT 7.0  ALBUMIN 4.0    TUMOR MARKERS: No results for input(s): "AFPTM", "CEA", "CA199", "CHROMGRNA" in the last 8760 hours.  Assessment and Plan:  Aren Saupe is a 67 yo male being seen today in relation to urothelial cancer. Patient has had port placement requested to facilitate chemotherapy. Case reviewed with Ryan Juliette Alcide and is scheduled for image-guided port placement on 01/20/23. Patient is NPO.  Risks and benefits of image guided port-a-catheter placement was discussed with the patient including, but  not limited to bleeding, infection, pneumothorax, or fibrin sheath development and need for additional procedures.  All of the patient's questions were answered, patient is agreeable to proceed. Consent signed and in chart.   Thank  you for this interesting consult.  I greatly enjoyed meeting TAMI TARDO and look forward to participating in their care.  A copy of this report was sent to the requesting provider on this date.  Electronically Signed: Kennieth Francois, PA-C 01/20/2023, 1:58 PM   I spent a total of  15 Minutes   in face to face in clinical consultation, greater than 50% of which was counseling/coordinating care for urothelial cancer.

## 2023-01-20 NOTE — Progress Notes (Signed)
Patient clinically stable post IR Right Port placement per Dr Juliette Alcide, tolerated well. Vitals stable pre and post procedure. Vitals stable pre and post procedure. Received Versed 2 mg along with Fentanyl 100 mcg IV for procedure. Report given to [facility] RN/11/specials post procedure.

## 2023-01-23 ENCOUNTER — Inpatient Hospital Stay: Payer: Medicare Other

## 2023-01-23 ENCOUNTER — Inpatient Hospital Stay (HOSPITAL_BASED_OUTPATIENT_CLINIC_OR_DEPARTMENT_OTHER): Payer: Medicare Other | Admitting: Internal Medicine

## 2023-01-23 ENCOUNTER — Encounter: Payer: Self-pay | Admitting: Internal Medicine

## 2023-01-23 VITALS — BP 116/87 | HR 82 | Temp 95.5°F | Resp 18 | Ht 71.0 in | Wt 160.5 lb

## 2023-01-23 VITALS — BP 141/79 | HR 70 | Temp 98.1°F | Resp 17

## 2023-01-23 DIAGNOSIS — Z5189 Encounter for other specified aftercare: Secondary | ICD-10-CM | POA: Diagnosis not present

## 2023-01-23 DIAGNOSIS — F17210 Nicotine dependence, cigarettes, uncomplicated: Secondary | ICD-10-CM | POA: Diagnosis not present

## 2023-01-23 DIAGNOSIS — C675 Malignant neoplasm of bladder neck: Secondary | ICD-10-CM | POA: Diagnosis not present

## 2023-01-23 DIAGNOSIS — C679 Malignant neoplasm of bladder, unspecified: Secondary | ICD-10-CM | POA: Diagnosis not present

## 2023-01-23 DIAGNOSIS — Z5111 Encounter for antineoplastic chemotherapy: Secondary | ICD-10-CM | POA: Diagnosis not present

## 2023-01-23 LAB — CBC WITH DIFFERENTIAL (CANCER CENTER ONLY)
Abs Immature Granulocytes: 0.05 10*3/uL (ref 0.00–0.07)
Basophils Absolute: 0.1 10*3/uL (ref 0.0–0.1)
Basophils Relative: 1 %
Eosinophils Absolute: 0.2 10*3/uL (ref 0.0–0.5)
Eosinophils Relative: 2 %
HCT: 42.7 % (ref 39.0–52.0)
Hemoglobin: 14.9 g/dL (ref 13.0–17.0)
Immature Granulocytes: 1 %
Lymphocytes Relative: 20 %
Lymphs Abs: 2 10*3/uL (ref 0.7–4.0)
MCH: 32.6 pg (ref 26.0–34.0)
MCHC: 34.9 g/dL (ref 30.0–36.0)
MCV: 93.4 fL (ref 80.0–100.0)
Monocytes Absolute: 0.9 10*3/uL (ref 0.1–1.0)
Monocytes Relative: 10 %
Neutro Abs: 6.5 10*3/uL (ref 1.7–7.7)
Neutrophils Relative %: 66 %
Platelet Count: 316 10*3/uL (ref 150–400)
RBC: 4.57 MIL/uL (ref 4.22–5.81)
RDW: 12.4 % (ref 11.5–15.5)
WBC Count: 9.6 10*3/uL (ref 4.0–10.5)
nRBC: 0 % (ref 0.0–0.2)

## 2023-01-23 LAB — CMP (CANCER CENTER ONLY)
ALT: 25 U/L (ref 0–44)
AST: 27 U/L (ref 15–41)
Albumin: 4.2 g/dL (ref 3.5–5.0)
Alkaline Phosphatase: 73 U/L (ref 38–126)
Anion gap: 11 (ref 5–15)
BUN: 19 mg/dL (ref 8–23)
CO2: 25 mmol/L (ref 22–32)
Calcium: 8.7 mg/dL — ABNORMAL LOW (ref 8.9–10.3)
Chloride: 98 mmol/L (ref 98–111)
Creatinine: 1.08 mg/dL (ref 0.61–1.24)
GFR, Estimated: >60 mL/min (ref >60)
Glucose, Bld: 97 mg/dL (ref 70–99)
Potassium: 3.7 mmol/L (ref 3.5–5.1)
Sodium: 134 mmol/L — ABNORMAL LOW (ref 135–145)
Total Bilirubin: 0.9 mg/dL (ref 0.3–1.2)
Total Protein: 7.6 g/dL (ref 6.5–8.1)

## 2023-01-23 LAB — MAGNESIUM: Magnesium: 2.4 mg/dL (ref 1.7–2.4)

## 2023-01-23 MED ORDER — ONDANSETRON HCL 4 MG/2ML IJ SOLN
8.0000 mg | Freq: Once | INTRAMUSCULAR | Status: AC
  Administered 2023-01-23: 8 mg via INTRAVENOUS
  Filled 2023-01-23: qty 4

## 2023-01-23 MED ORDER — HEPARIN SOD (PORK) LOCK FLUSH 100 UNIT/ML IV SOLN
500.0000 [IU] | Freq: Once | INTRAVENOUS | Status: AC | PRN
  Filled 2023-01-23: qty 5

## 2023-01-23 MED ORDER — METHOTREXATE SODIUM CHEMO INJECTION (PF) 50 MG/2ML
30.0000 mg/m2 | Freq: Once | INTRAMUSCULAR | Status: AC
  Administered 2023-01-23: 58 mg via INTRAVENOUS
  Filled 2023-01-23: qty 2.32

## 2023-01-23 MED ORDER — SODIUM CHLORIDE 0.9 % IV SOLN
10.0000 mg | Freq: Once | INTRAVENOUS | Status: AC
  Administered 2023-01-23: 10 mg via INTRAVENOUS
  Filled 2023-01-23: qty 10

## 2023-01-23 MED ORDER — HEPARIN SOD (PORK) LOCK FLUSH 100 UNIT/ML IV SOLN
INTRAVENOUS | Status: AC
  Administered 2023-01-23: 500 [IU]
  Filled 2023-01-23: qty 5

## 2023-01-23 MED ORDER — SODIUM CHLORIDE 0.9% FLUSH
10.0000 mL | Freq: Once | INTRAVENOUS | Status: AC
  Administered 2023-01-23: 10 mL via INTRAVENOUS
  Filled 2023-01-23: qty 10

## 2023-01-23 MED ORDER — SODIUM CHLORIDE 0.9 % IV SOLN
Freq: Once | INTRAVENOUS | Status: AC
  Filled 2023-01-23: qty 250

## 2023-01-23 MED FILL — Fosaprepitant Dimeglumine For IV Infusion 150 MG (Base Eq): INTRAVENOUS | Qty: 5 | Status: AC

## 2023-01-23 MED FILL — Dexamethasone Sodium Phosphate Inj 100 MG/10ML: INTRAMUSCULAR | Qty: 1 | Status: AC

## 2023-01-23 NOTE — Progress Notes (Signed)
[facility] CONSULT NOTE  Patient Care Team: Marisue Ivan, MD as PCP - General (Family Medicine) Michaelyn Barter, MD as Consulting Physician (Oncology)  REFERRING PROVIDER: Dr. Lonna Cobb  REASON FOR REFFERAL: bladder cancer  CANCER STAGING   Cancer Staging  Bladder cancer [facility]) Staging form: Urinary Bladder, AJCC 8th Edition - Clinical stage from 12/07/2022: Stage II (cT2, cN0, cM0) - Signed by Michaelyn Barter, MD on 01/02/2023 Stage prefix: Initial diagnosis WHO/ISUP grade (low/high): High Grade Histologic grading system: 2 grade system   ASSESSMENT & PLAN:  Ryan Dixon 67 y.o. male with pmh of HTN, HLD, current smoker referred to Medical Oncology for management of bladder cancer.   # Bladder cancer, high grade, cT2 N0 M0 -Was seen by Dr. Lonna Cobb for intermittent hematuria.  Underwent cystoscopy with biopsy of mass close to right ureteral orifice, 2 masses from the bladder neck.  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  -Repeat CT chest abdomen pelvis from 01/06/2023 showed nodular wall thickening along the posterior urinary bladder consistent with known bladder cancer.  No evidence of metastatic disease in the chest abdomen pelvis.  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  - Echo showed normal EF of 55 to 60%.  -We had a long discussion with the patient and family last visit for cisplatin/gemcitabine versus dose dense MVAC.  Patient decided to proceed with DD MVAC.  Will plan for total 4-6 cycles.  - Will obtain labs today.  His labs from last visit were normal so I am not anticipating any new changes.  He will get methotrexate today.  Then come back on day 2 for doxorubicin, vinblastine and cisplatin.  Day 4 Neulasta injection.  I discussed with him about use of Decadron for 3 days after chemotherapy.  We also went over the use of ondansetron and Compazine as needed.   Use Claritin for bone pain.  Advised him to avoid NSAIDs.  Follow-up in 1 week with APP visit, labs, fluid for supportive care.  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   Orders Placed This Encounter  Procedures   CBC with Differential (Cancer Center Only)    Standing Status:   Future    Standing Expiration Date:   02/06/2024   CMP (Cancer Center only)    Standing Status:   Future    Standing Expiration Date:   02/06/2024   Magnesium    Standing Status:   Future    Standing Expiration Date:   02/06/2024   CBC with Differential/Platelet    Standing Status:   Future    Standing Expiration Date:   01/23/2024   Comprehensive metabolic panel    Standing Status:   Future    Standing Expiration Date:   01/23/2024   Magnesium    Standing Status:   Future    Standing Expiration Date:   01/23/2024   RTC in 1 week for APP visit, labs, fluid RTC in 2 weeks for MD visit, labs, cycle 2 dose dense MVAC.  The total time spent in the appointment was 30 minutes encounter with patients including review of chart and various tests results, discussions about plan of care and coordination of care plan   All questions were answered. The patient knows to call the clinic with any problems, questions or concerns. No barriers to learning  was detected.  Michaelyn Barter, MD 8/12/202410:47 AM   HISTORY OF PRESENTING ILLNESS:  Ryan Dixon 67 y.o. male with pmh of HTN, HLD, current smoker referred to Medical Oncology for management of bladder cancer.   Interval history Patient seen today prior to cycle 1 of dose dense and back accompanied with wife. Doing well overall.  Denies any new symptoms or concerns.  I have reviewed his chart and materials related to his cancer extensively and collaborated history with the patient. Summary of oncologic history is as  follows: Oncology History  Bladder cancer (HCC)  10/25/2022 Initial Diagnosis   Seen by Dr. Lonna Cobb for intermittent hematuria since February 2024 along with other urinary symptoms.   10/28/2022 Imaging   CT hematuria workup Showed 2.2 cm bladder mass worrisome for cancer.  Adjacent mild diffuse bladder wall thickening.  Based on the location this may involve the extreme distal aspect of the right UVJ.  Extensive atherosclerotic changes with the occlusion of the right common iliac artery and areas of significant stenosis along the left. Please correlate with any symptoms such as claudication and further workup as clinically appropriate.  CT chest with contrast IMPRESSION: 1. Lung-RADS 2, benign appearance or behavior. Continue annual screening with low-dose chest CT without contrast in 12 months. 2. Similar ascending aortic dilatation at 4.2 cm. Recommend attention on follow-up lung cancer screening CT in 1 year.   11/29/2022 Procedure   Intraoperative findings:  Cystoscopy-urethra normal in caliber without stricture.  Prostate with mild lateral lobe enlargement and moderate bladder neck elevation.  UOs normal-appearing bilaterally.  The right UO was anterior to the bladder tumor Bladder tumor: 3 cm papillary/nodular tumor superior to right UO and extending laterally; ~ 1 cm papillary tumor anterior bladder neck 10:00; 2-1 cm papillary tumors anterior bladder neck 2:00    11/29/2022 Pathology Results        12/07/2022 Cancer Staging   Staging form: Urinary Bladder, AJCC 8th Edition - Clinical stage from 12/07/2022: Stage II (cT2, cN0, cM0) - Signed by Michaelyn Barter, MD on 01/02/2023 Stage prefix: Initial diagnosis WHO/ISUP grade (low/high): High Grade Histologic grading system: 2 grade system   01/02/2023 Initial Diagnosis   Bladder cancer (HCC)   01/23/2023 -  Chemotherapy   Patient is on Treatment Plan : BLADDER DOSE DENSE MVAC q14d       MEDICAL HISTORY:  Past Medical  History:  Diagnosis Date   Aortic atherosclerosis (HCC)    Chronic foot pain, left    History of hematuria    Hypertension    Other emphysema (HCC)    Other male erectile dysfunction    Pre-diabetes    Pure hypercholesterolemia    Tobacco dependence     SURGICAL HISTORY: Past Surgical History:  Procedure Laterality Date   ANKLE ARTHROSCOPY Left 08/11/2020   Procedure: LEFT ANKLE ARTHROSCOPY AND DEBRIDEMENT;  Surgeon: Nadara Mustard, MD;  Location: [facility];  Service: Orthopedics;  Laterality: Left;   BLADDER INSTILLATION N/A 11/29/2022   Procedure: BLADDER INSTILLATION OF GEMCITABINE;  Surgeon: [name], MD;  Location: [facility];  Service: Urology;  Laterality: N/A;   COLONOSCOPY     IR IMAGING GUIDED PORT INSERTION  01/20/2023   TONSILLECTOMY     TRANSURETHRAL RESECTION OF BLADDER TUMOR N/A 11/29/2022   Procedure: TRANSURETHRAL RESECTION OF BLADDER TUMOR (TURBT);  Surgeon: [name], MD;  Location: [facility];  Service: Urology;  Laterality: N/A;    SOCIAL HISTORY: Social History   Socioeconomic History  Marital status: Married    Spouse name: Albin Felling   Number of children: Not on file   Years of education: Not on file   Highest education level: Not on file  Occupational History   Not on file  Tobacco Use   Smoking status: Every Day    Current packs/day: 2.00    Average packs/day: 2.0 packs/day for 43.0 years (86.0 ttl pk-yrs)    Types: Cigarettes   Smokeless tobacco: Never  Substance and Sexual Activity   Alcohol use: Yes    Comment: 1-2 beers/day   Drug use: Never   Sexual activity: Not on file  Other Topics Concern   Not on file  Social History Narrative   Not on file   Social Determinants of Health   Financial Resource Strain: Not on file  Food Insecurity: Not on file  Transportation Needs: Not on file  Physical Activity: Not on file  Stress: Not on file  Social Connections: Not on file  Intimate Partner Violence: Not on file     FAMILY HISTORY: History reviewed. No pertinent family history.  ALLERGIES:  is allergic to nsaids and other.  MEDICATIONS:  Current Outpatient Medications  Medication Sig Dispense Refill   atorvastatin (LIPITOR) 20 MG tablet TAKE 1 TABLET BY MOUTH EVERY DAY AT NIGHT     dexamethasone (DECADRON) 4 MG tablet Take 2 tablets (8 mg) by mouth daily x 3 days starting the day after cisplatin chemotherapy. Take with food. 30 tablet 1   hydrochlorothiazide (HYDRODIURIL) 12.5 MG tablet Take 12.5 mg by mouth daily.     lidocaine-prilocaine (EMLA) cream Apply to affected area once 30 g 3   prochlorperazine (COMPAZINE) 10 MG tablet Take 1 tablet (10 mg total) by mouth every 6 (six) hours as needed (Nausea or vomiting). 30 tablet 1   HYDROcodone-acetaminophen (NORCO/VICODIN) 5-325 MG tablet Take 1 tablet by mouth every 6 (six) hours as needed for moderate pain. (Patient not taking: Reported on 01/02/2023) 8 tablet 0   ondansetron (ZOFRAN) 8 MG tablet Take 1 tablet (8 mg total) by mouth every 8 (eight) hours as needed for nausea or vomiting. Start on the third day after cisplatin. (Patient not taking: Reported on 01/23/2023) 30 tablet 1   phenazopyridine (PYRIDIUM) 200 MG tablet Take 1 tablet (200 mg total) by mouth 3 (three) times daily as needed (burning with urination). (Patient not taking: Reported on 01/02/2023) 15 tablet 0   No current facility-administered medications for this visit.   Facility-Administered Medications Ordered in Other Visits  Medication Dose Route Frequency Provider Last Rate Last Admin   sodium chloride flush (NS) 0.9 % injection 10 mL  10 mL Intravenous Once Michaelyn Barter, MD        REVIEW OF SYSTEMS:   Pertinent information mentioned in HPI All other systems were reviewed with the patient and are negative.  PHYSICAL EXAMINATION: ECOG PERFORMANCE STATUS: 0 - Asymptomatic  Vitals:   01/23/23 1008  BP: 116/87  Pulse: 82  Resp: 18  Temp: (!) 95.5 F (35.3 C)  SpO2:  98%   Filed Weights   01/23/23 1008  Weight: 160 lb 8 oz (72.8 kg)    GENERAL:alert, no distress and comfortable SKIN: skin color, texture, turgor are normal, no rashes or significant lesions EYES: normal, conjunctiva are pink and non-injected, sclera clear OROPHARYNX:no exudate, no erythema and lips, buccal mucosa, and tongue normal  NECK: supple, thyroid normal size, non-tender, without nodularity LYMPH:  no palpable lymphadenopathy in the cervical, axillary or inguinal LUNGS:  clear to auscultation and percussion with normal breathing effort HEART: regular rate & rhythm and no murmurs and no lower extremity edema ABDOMEN:abdomen soft, non-tender and normal bowel sounds Musculoskeletal:no cyanosis of digits and no clubbing  PSYCH: alert & oriented x 3 with fluent speech NEURO: no focal motor/sensory deficits  LABORATORY DATA:  I have reviewed the data as listed Lab Results  Component Value Date   WBC 9.7 01/02/2023   HGB 14.2 01/02/2023   HCT 41.7 01/02/2023   MCV 95.4 01/02/2023   PLT 315 01/02/2023   Recent Labs    11/24/22 1006 01/02/23 1457  NA 138 138  K 3.6 3.7  CL 99 100  CO2 29 29  GLUCOSE 98 101*  BUN 19 22  CREATININE 1.21 1.18  CALCIUM 9.1 8.7*  GFRNONAA >60 >60  PROT  --  7.0  ALBUMIN  --  4.0  AST  --  24  ALT  --  27  ALKPHOS  --  71  BILITOT  --  0.4    RADIOGRAPHIC STUDIES: I have personally reviewed the radiological images as listed and agreed with the findings in the report. IR IMAGING GUIDED PORT INSERTION  Result Date: 01/20/2023 INDICATION: Chemotherapy access EXAM: Placement of chest port using ultrasound and fluoroscopic guidance MEDICATIONS: Documented in the EMR ANESTHESIA/SEDATION: Moderate (conscious) sedation was employed during this procedure. A total of Versed 2 mg and Fentanyl 100 mcg was administered intravenously. Moderate Sedation Time: 25 minutes. The patient's level of consciousness and vital signs were monitored continuously  by radiology nursing throughout the procedure under my direct supervision. FLUOROSCOPY TIME:  Fluoroscopy Time: 0.6 minutes (2 mGy) COMPLICATIONS: None immediate. PROCEDURE: Informed written consent was obtained from the patient after a thorough discussion of the procedural risks, benefits and alternatives. All questions were addressed. Maximal Sterile Barrier Technique was utilized including caps, mask, sterile gowns, sterile gloves, sterile drape, hand hygiene and skin antiseptic. A timeout was performed prior to the initiation of the procedure. The patient was placed supine on the exam table. The right neck and chest was prepped and draped in the standard sterile fashion. A preliminary ultrasound of the right neck was performed and demonstrates a patent right internal jugular vein. A permanent ultrasound image was stored in the electronic medical record. The overlying skin was anesthetized with 1% Lidocaine. Using ultrasound guidance, access was obtained into the right internal jugular vein using a 21 gauge micropuncture set. A wire was advanced into the SVC, a short incision was made at the puncture site, and serial dilatation performed. Next, in an ipsilateral infraclavicular location, an incision was made at the site of the subcutaneous reservoir. Blunt dissection was used to open a pocket to contain the reservoir. A subcutaneous tunnel was then created from the port site to the puncture site. A(n) 8 Fr single lumen catheter was advanced through the tunnel. The catheter was attached to the port and this was placed in the subcutaneous pocket. Under fluoroscopic guidance, a peel away sheath was placed, and the catheter was trimmed to the appropriate length and was advanced into the central veins. The catheter length is 24 cm. The tip of the catheter lies near the superior cavoatrial junction. The port flushes and aspirates appropriately. The port was flushed and locked with heparinized saline. The port pocket  was closed in 2 layers using 3-0 and 4-0 Vicryl/absorbable suture. Dermabond was also applied to both incisions. The patient tolerated the procedure well and was transferred to recovery in stable condition.  IMPRESSION: Successful placement of a right-sided chest port via the right internal jugular vein. The port is ready for immediate use. Electronically Signed   By: Olive Bass M.D.   On: 01/20/2023 15:48   ECHOCARDIOGRAM COMPLETE  Result Date: 01/18/2023    ECHOCARDIOGRAM REPORT   Patient Name:   Ryan Dixon Date of Exam: 01/18/2023 Medical Rec #:  [id number]    Height:       70.0 in Accession #:    [id number]   Weight:       166.6 lb Date of Birth:  11-26-1955     BSA:          1.931 m Patient Age:    67 years     BP:           135/74 mmHg Patient Gender: M            HR:           75 bpm. Exam Location:  [facility] Procedure: 2D Echo, 3D Echo, Cardiac Doppler, Color Doppler, Strain Analysis and            Intracardiac Opacification Agent Indications:     Chemo  History:         Patient has no prior history of Echocardiogram examinations.                  Bladder CA.  Sonographer:     Mikki Harbor Referring Phys:  [id number] Michaelyn Barter Diagnosing Phys: Yvonne Kendall MD  Sonographer Comments: Global longitudinal strain was attempted. IMPRESSIONS  1. Left ventricular ejection fraction, by estimation, is 55 to 60%. The left ventricle has normal function. The left ventricle has no regional wall motion abnormalities. Left ventricular diastolic parameters were normal.  2. Right ventricular systolic function is normal. The right ventricular size is normal. There is normal pulmonary artery systolic pressure.  3. The mitral valve is normal in structure. Trivial mitral valve regurgitation. No evidence of mitral stenosis.  4. The aortic valve has an indeterminant number of cusps. There is mild calcification of the aortic valve. There is moderate thickening of the aortic valve. Aortic valve regurgitation is not  visualized. Aortic valve sclerosis is present, with no evidence of aortic valve stenosis.  5. The inferior vena cava is normal in size with greater than 50% respiratory variability, suggesting right atrial pressure of 3 mmHg. FINDINGS  Left Ventricle: Left ventricular ejection fraction, by estimation, is 55 to 60%. The left ventricle has normal function. The left ventricle has no regional wall motion abnormalities. Definity contrast agent was given IV to delineate the left ventricular  endocardial borders. The left ventricular internal cavity size was normal in size. There is no left ventricular hypertrophy. Left ventricular diastolic parameters were normal. Right Ventricle: The right ventricular size is normal. No increase in right ventricular wall thickness. Right ventricular systolic function is normal. There is normal pulmonary artery systolic pressure. The tricuspid regurgitant velocity is 2.45 m/s, and  with an assumed right atrial pressure of 3 mmHg, the estimated right ventricular systolic pressure is 27.0 mmHg. Left Atrium: Left atrial size was normal in size. Right Atrium: Right atrial size was normal in size. Pericardium: There is no evidence of pericardial effusion. Mitral Valve: The mitral valve is normal in structure. Trivial mitral valve regurgitation. No evidence of mitral valve stenosis. MV peak gradient, 3.6 mmHg. The mean mitral valve gradient is 2.0 mmHg. Tricuspid Valve: The tricuspid valve is normal in structure. Tricuspid valve regurgitation is  trivial. Aortic Valve: The aortic valve has an indeterminant number of cusps. There is mild calcification of the aortic valve. There is moderate thickening of the aortic valve. Aortic valve regurgitation is not visualized. Aortic valve sclerosis is present, with no evidence of aortic valve stenosis. Aortic valve mean gradient measures 6.0 mmHg. Aortic valve peak gradient measures 12.5 mmHg. Aortic valve area, by VTI measures 1.76 cm. Pulmonic Valve:  The pulmonic valve was normal in structure. Pulmonic valve regurgitation is not visualized. No evidence of pulmonic stenosis. Aorta: The aortic root and ascending aorta are structurally normal, with no evidence of dilitation. Pulmonary Artery: The pulmonary artery is of normal size. Venous: The inferior vena cava is normal in size with greater than 50% respiratory variability, suggesting right atrial pressure of 3 mmHg. IAS/Shunts: The interatrial septum appears to be lipomatous. The interatrial septum was not well visualized.  LEFT VENTRICLE PLAX 2D LVIDd:         4.50 cm   Diastology LVIDs:         2.70 cm   LV e' medial:    9.25 cm/s LV PW:         1.00 cm   LV E/e' medial:  11.1 LV IVS:        1.02 cm   LV e' lateral:   12.90 cm/s LVOT diam:     2.10 cm   LV E/e' lateral: 8.0 LV SV:         71 LV SV Index:   37        2D Longitudinal Strain LVOT Area:     3.46 cm  2D Strain GLS Avg:     -16.8 %  RIGHT VENTRICLE RV Basal diam:  3.55 cm RV Mid diam:    2.90 cm RV S prime:     11.50 cm/s TAPSE (M-mode): 2.2 cm LEFT ATRIUM             Index        RIGHT ATRIUM           Index LA diam:        2.80 cm 1.45 cm/m   RA Area:     12.90 cm LA Vol (A2C):   35.4 ml 18.33 ml/m  RA Volume:   30.90 ml  16.00 ml/m LA Vol (A4C):   29.5 ml 15.27 ml/m LA Biplane Vol: 33.2 ml 17.19 ml/m  AORTIC VALVE                     PULMONIC VALVE AV Area (Vmax):    1.98 cm      PV Vmax:       1.24 m/s AV Area (Vmean):   1.75 cm      PV Peak grad:  6.2 mmHg AV Area (VTI):     1.76 cm AV Vmax:           177.00 cm/s AV Vmean:          110.000 cm/s AV VTI:            0.405 m AV Peak Grad:      12.5 mmHg AV Mean Grad:      6.0 mmHg LVOT Vmax:         101.00 cm/s LVOT Vmean:        55.500 cm/s LVOT VTI:          0.206 m LVOT/AV VTI ratio: 0.51  AORTA Ao Root diam: 3.60 cm Ao Asc diam:  3.50 cm MITRAL VALVE                TRICUSPID VALVE MV Area (PHT): 3.33 cm     TR Peak grad:   24.0 mmHg MV Area VTI:   2.26 cm     TR Vmax:        245.00  cm/s MV Peak grad:  3.6 mmHg MV Mean grad:  2.0 mmHg     SHUNTS MV Vmax:       0.95 m/s     Systemic VTI:  0.21 m MV Vmean:      57.7 cm/s    Systemic Diam: 2.10 cm MV Decel Time: 228 msec MV E velocity: 103.00 cm/s MV A velocity: 102.00 cm/s MV E/A ratio:  1.01 Yvonne Kendall MD Electronically signed by Yvonne Kendall MD Signature Date/Time: 01/18/2023/4:10:36 PM    Final    CT CHEST ABDOMEN PELVIS W CONTRAST  Result Date: 01/06/2023 CLINICAL DATA:  History of bladder cancer, staging. * Tracking Code: BO * EXAM: CT CHEST, ABDOMEN, AND PELVIS WITH CONTRAST TECHNIQUE: Multidetector CT imaging of the chest, abdomen and pelvis was performed following the standard protocol during bolus administration of intravenous contrast. RADIATION DOSE REDUCTION: This exam was performed according to the departmental dose-optimization program which includes automated exposure control, adjustment of the mA and/or kV according to patient size and/or use of iterative reconstruction technique. 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 Aortic atherosclerosis. No central pulmonary embolus on this nondedicated study. Mediastinum/Nodes: No pathologically enlarged mediastinal, hilar or axillary lymph nodes no suspicious thyroid nodule. The esophagus is grossly unremarkable Lungs/Pleura: No suspicious pulmonary nodules or masses calcified granulomas. Mild diffuse bronchial wall thickening centrilobular and paraseptal emphysema. No pleural effusion. No pneumothorax. Musculoskeletal: Subcutaneous cystic lesion in the paramedian upper back measuring 19 mm on image 9/2 is compatible with a sebaceous cyst no aggressive lytic or blastic lesion of bone. CT ABDOMEN PELVIS FINDINGS Hepatobiliary: No suspicious hepatic lesion. Gallbladder is unremarkable. No biliary ductal  dilation. Pancreas: No pancreatic ductal dilation or evidence of acute inflammation. Spleen: No splenomegaly. Adrenals/Urinary Tract: Bilateral adrenal glands appear normal. No hydronephrosis. Kidneys demonstrate symmetric enhancement. Nodular wall thickening along the posterior urinary bladder on image 111/2. Stomach/Bowel: No radiopaque enteric contrast material was administered. Stomach is unremarkable for degree of distension. No pathologic dilation of small or large bowel. Colonic diverticulosis without findings of acute diverticulitis. Vascular/Lymphatic: Normal caliber abdominal aorta. Aortic atherosclerosis. Smooth IVC contours. The portal, splenic and superior mesenteric veins are patent. No pathologically enlarged abdominal or pelvic lymph nodes. Reproductive: Prostate is unremarkable. Other: Fat containing small right inguinal hernia. Left inguinal hernia contains fat and nonobstructed portion of sigmoid colon. Fat containing paraumbilical hernia. Musculoskeletal: No aggressive lytic or blastic lesion of bone. Multilevel degenerative changes spine. Degenerative change of the bilateral hips. IMPRESSION: 1. Nodular wall thickening along the posterior urinary bladder, compatible with known bladder cancer. 2. No evidence of metastatic disease in the chest, abdomen or pelvis. 3. 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 Attention on follow-up oncologic surveillance imaging. 4. Aortic Atherosclerosis (ICD10-I70.0) and Emphysema (ICD10-J43.9). Electronically Signed   By: Maudry Mayhew M.D.   On: 01/06/2023 16:09

## 2023-01-23 NOTE — Progress Notes (Signed)
Patient has no concerns at this time

## 2023-01-23 NOTE — Patient Instructions (Signed)
Methotrexate Injection What is this medication? METHOTREXATE (METH oh TREX ate) treats inflammatory conditions such as arthritis and psoriasis. It works by decreasing inflammation, which can reduce pain and prevent long-term injury to the joints and skin. It may also be used to treat some types of cancer. It works by slowing down the growth of cancer cells. This medicine may be used for other purposes; ask your health care provider or pharmacist if you have questions. What should I tell my care team before I take this medication? They need to know if you have any of these conditions: Fluid in the stomach area or lungs Frequently drink alcohol Infection or immune system problems Kidney disease Liver disease Low blood counts (white cells, platelets, or red blood cells) Lung disease Recent or ongoing radiation Recent or upcoming vaccine Stomach ulcers Ulcerative colitis An unusual or allergic reaction to methotrexate, other medications, foods, dyes, or preservatives Pregnant or trying to get pregnant Breastfeeding How should I use this medication? This medication is for infusion into a vein or for injection into muscle or into the spinal fluid (whichever applies). It is usually given in a hospital or clinic setting. In rare cases, you might get this medication at home. You will be taught how to give this medication. Use exactly as directed. Take your medication at regular intervals. Do not take your medication more often than directed. If this medication is used for arthritis or psoriasis, it should be taken weekly, NOT daily. It is important that you put your used needles and syringes in a special sharps container. Do not put them in a trash can. If you do not have a sharps container, call your pharmacist or care team to get one. Talk to your care team about the use of this medication in children. While this medication may be prescribed for children as young as 2 years for selected conditions,  precautions do apply. Overdosage: If you think you have taken too much of this medicine contact a poison control center or emergency room at once. NOTE: This medicine is only for you. Do not share this medicine with others. What if I miss a dose? It is important not to miss your dose. Call your care team if you are unable to keep an appointment. If you give yourself the medication, and you miss a dose, talk with your care team. Do not take double or extra doses. What may interact with this medication? Do not take this medication with any of the following: Acitretin Probenecid This medication may also interact with the following: Aspirin or aspirin-like medications Azathioprine Certain antibiotics, such as gentamicin, penicillin, tetracycline, vancomycin Certain medications that treat or prevent blood clots, such as warfarin, apixaban, dabigatran, rivaroxaban Certain medications for stomach problems, such as esomeprazole, omeprazole, pantoprazole Dapsone Hydroxychloroquine Live virus vaccines Medications for viral infections, such as acyclovir, cidofovir, foscarnet, ganciclovir Mercaptopurine NSAIDs, medications for pain and inflammation, such as ibuprofen or naproxen Phenytoin Pyrimethamine Retinoids, such as isotretinoin or tretinoin Sulfonamides, such as sulfasalazine or trimethoprim; sulfamethoxazole Theophylline This list may not describe all possible interactions. Give your health care provider a list of all the medicines, herbs, non-prescription drugs, or dietary supplements you use. Also tell them if you smoke, drink alcohol, or use illegal drugs. Some items may interact with your medicine. What should I watch for while using this medication? This medication may make you feel generally unwell. This is not uncommon as chemotherapy can affect healthy cells as well as cancer cells. Report any side effects.  Continue your course of treatment even though you feel ill unless your care  team tells you to stop. Your condition will be monitored carefully while you are receiving this medication. Avoid alcoholic drinks. This medication can cause serious side effects. To reduce the risk, your care team may give you other medications to take before receiving this one. Be sure to follow the directions from your care team. This medication can make you more sensitive to the sun. Keep out of the sun. If you cannot avoid being in the sun, wear protective clothing and use sunscreen. Do not use sun lamps or tanning beds/booths. You may get drowsy or dizzy. Do not drive, use machinery, or do anything that needs mental alertness until you know how this medication affects you. Do not stand or sit up quickly, especially if you are an older patient. This reduces the risk of dizzy or fainting spells. You may need blood work while you are taking this medication. Call your care team for advice if you get a fever, chills or sore throat, or other symptoms of a cold or flu. Do not treat yourself. This medication decreases your body's ability to fight infections. Try to avoid being around people who are sick. This medication may increase your risk to bruise or bleed. Call your care team if you notice any unusual bleeding. Be careful brushing or flossing your teeth or using a toothpick because you may get an infection or bleed more easily. If you have any dental work done, tell your dentist you are receiving this medication Check with your care team if you get an attack of severe diarrhea, nausea and vomiting, or if you sweat a lot. The loss of too much body fluid can make it dangerous for you to take this medication. Talk to your care team about your risk of cancer. You may be more at risk for certain types of cancers if you take this medication. Do not become pregnant while taking this medication or for 6 months after stopping it. Women should inform their care team if they wish to become pregnant or think  they might be pregnant. Men should not father a child while taking this medication and for 3 months after stopping it. There is potential for serious harm to an unborn child. Talk to your care team for more information. Do not breast-feed an infant while taking this medication or for 1 week after stopping it. This medication may make it more difficult to get pregnant or father a child. Talk to your care team if you are concerned about your fertility. What side effects may I notice from receiving this medication? Side effects that you should report to your care team as soon as possible: Allergic reactions--skin rash, itching, hives, swelling of the face, lips, tongue, or throat Blood clot--pain, swelling, or warmth in the leg, shortness of breath, chest pain Dry cough, shortness of breath or trouble breathing Infection--fever, chills, cough, sore throat, wounds that don't heal, pain or trouble when passing urine, general feeling of discomfort or being unwell Kidney injury--decrease in the amount of urine, swelling of the ankles, hands, or feet Liver injury--right upper belly pain, loss of appetite, nausea, light-colored stool, dark yellow or brown urine, yellowing of the skin or eyes, unusual weakness or fatigue Low red blood cell count--unusual weakness or fatigue, dizziness, headache, trouble breathing Redness, blistering, peeling, or loosening of the skin, including inside the mouth Seizures Unusual bruising or bleeding Side effects that usually do not require medical  attention (report to your care team if they continue or are bothersome): Diarrhea Dizziness Hair loss Nausea Pain, redness, or swelling with sores inside the mouth or throat Vomiting This list may not describe all possible side effects. Call your doctor for medical advice about side effects. You may report side effects to FDA at 1-800-FDA-1088. Where should I keep my medication? This medication is given in a hospital or clinic.  It will not be stored at home. NOTE: This sheet is a summary. It may not cover all possible information. If you have questions about this medicine, talk to your doctor, pharmacist, or health care provider.  2024 Elsevier/Gold Standard (2022-11-02 00:00:00)

## 2023-01-24 ENCOUNTER — Other Ambulatory Visit: Payer: Self-pay

## 2023-01-24 ENCOUNTER — Inpatient Hospital Stay: Payer: Medicare Other

## 2023-01-24 VITALS — BP 131/70 | HR 88 | Temp 98.0°F | Resp 18

## 2023-01-24 DIAGNOSIS — C679 Malignant neoplasm of bladder, unspecified: Secondary | ICD-10-CM

## 2023-01-24 DIAGNOSIS — Z5111 Encounter for antineoplastic chemotherapy: Secondary | ICD-10-CM | POA: Diagnosis not present

## 2023-01-24 MED ORDER — SODIUM CHLORIDE 0.9 % IV SOLN
70.0000 mg/m2 | Freq: Once | INTRAVENOUS | Status: AC
  Administered 2023-01-24: 134 mg via INTRAVENOUS
  Filled 2023-01-24: qty 134

## 2023-01-24 MED ORDER — MAGNESIUM SULFATE 2 GM/50ML IV SOLN
2.0000 g | Freq: Once | INTRAVENOUS | Status: AC
  Administered 2023-01-24: 2 g via INTRAVENOUS
  Filled 2023-01-24: qty 50

## 2023-01-24 MED ORDER — SODIUM CHLORIDE 0.9 % IV SOLN
Freq: Once | INTRAVENOUS | Status: AC
  Filled 2023-01-24: qty 250

## 2023-01-24 MED ORDER — POTASSIUM CHLORIDE IN NACL 20-0.9 MEQ/L-% IV SOLN
Freq: Once | INTRAVENOUS | Status: AC
  Filled 2023-01-24: qty 1000

## 2023-01-24 MED ORDER — HEPARIN SOD (PORK) LOCK FLUSH 100 UNIT/ML IV SOLN
500.0000 [IU] | Freq: Once | INTRAVENOUS | Status: AC | PRN
  Administered 2023-01-24: 500 [IU]
  Filled 2023-01-24: qty 5

## 2023-01-24 MED ORDER — VINBLASTINE SULFATE CHEMO INJECTION 1 MG/ML
3.0000 mg/m2 | Freq: Once | INTRAVENOUS | Status: AC
  Administered 2023-01-24: 5.8 mg via INTRAVENOUS
  Filled 2023-01-24: qty 5.8

## 2023-01-24 MED ORDER — DOXORUBICIN HCL CHEMO IV INJECTION 2 MG/ML
30.0000 mg/m2 | Freq: Once | INTRAVENOUS | Status: AC
  Administered 2023-01-24: 58 mg via INTRAVENOUS
  Filled 2023-01-24: qty 29

## 2023-01-24 MED ORDER — SODIUM CHLORIDE 0.9 % IV SOLN
10.0000 mg | Freq: Once | INTRAVENOUS | Status: AC
  Administered 2023-01-24: 10 mg via INTRAVENOUS
  Filled 2023-01-24: qty 10

## 2023-01-24 MED ORDER — SODIUM CHLORIDE 0.9 % IV SOLN
150.0000 mg | Freq: Once | INTRAVENOUS | Status: AC
  Administered 2023-01-24: 150 mg via INTRAVENOUS
  Filled 2023-01-24: qty 150

## 2023-01-24 MED ORDER — PALONOSETRON HCL INJECTION 0.25 MG/5ML
0.2500 mg | Freq: Once | INTRAVENOUS | Status: AC
  Administered 2023-01-24: 0.25 mg via INTRAVENOUS
  Filled 2023-01-24: qty 5

## 2023-01-24 NOTE — Progress Notes (Signed)
Nutrition Assessment:  Referral from [facility], starting chemo  67 year old male with bladder cancer.  Past medical history of HTN, HLD, smoker.  Patient receiving MVAC chemotherapy.   Met with patient during infusion.  Reports that appetite is normal.  Usually eats 2 meals a day (lunch and dinner).  Typically eats meal out at lunch time and dinner is cooked meal at home (meat and couple sides).  Wife helps prepare meals.  Denies any nutrition impact symptoms at this time.    Medications: zofran, compazine, decadron  Labs: reviewed  Anthropometrics:   Height: 71 inches Weight: 160 lb 8 oz on 8/12 UBW: 161-167 lb  167 lb on 5/30 BMI: 22  4% weight loss in the last 2 1/2 months   Estimated Energy Needs  Kcals: 1825-2190 Protein: 91-109 g Fluid: 1825-2190 ml  NUTRITION DIAGNOSIS: none at this time   INTERVENTION:  Discussed importance of good nutrition during treatment and weight maintenance Encouraged good sources of protein at meals/snacks Contact information provided    MONITORING, EVALUATION, GOAL: weight trends, intake   NEXT VISIT: Tuesday, Aug 27 during infusion   B. Freida Busman, RD, LDN Registered Dietitian [phone]

## 2023-01-24 NOTE — Patient Instructions (Signed)
[place] [facility]  Discharge Instructions: Thank you for choosing [facility] to provide your oncology and hematology care.  If you have a lab appointment with the Cancer Center, please go directly to the Cancer Center and check in at the registration area.  Wear comfortable clothing and clothing appropriate for easy access to any Portacath or PICC line.   We strive to give you quality time with your provider. You may need to reschedule your appointment if you arrive late (15 or more minutes).  Arriving late affects you and other patients whose appointments are after yours.  Also, if you miss three or more appointments without notifying the office, you may be dismissed from the clinic at the provider's discretion.      For prescription refill requests, have your pharmacy contact our office and allow 72 hours for refills to be completed.    Today you received the following chemotherapy and/or immunotherapy agents Adriamycin, Vinblastine and Cisplatin       To help prevent nausea and vomiting after your treatment, we encourage you to take your nausea medication as directed.  BELOW ARE SYMPTOMS THAT SHOULD BE REPORTED IMMEDIATELY: *FEVER GREATER THAN 100.4 F (38 C) OR HIGHER *CHILLS OR SWEATING *NAUSEA AND VOMITING THAT IS NOT CONTROLLED WITH YOUR NAUSEA MEDICATION *UNUSUAL SHORTNESS OF BREATH *UNUSUAL BRUISING OR BLEEDING *URINARY PROBLEMS (pain or burning when urinating, or frequent urination) *BOWEL PROBLEMS (unusual diarrhea, constipation, pain near the anus) TENDERNESS IN MOUTH AND THROAT WITH OR WITHOUT PRESENCE OF ULCERS (sore throat, sores in mouth, or a toothache) UNUSUAL RASH, SWELLING OR PAIN  UNUSUAL VAGINAL DISCHARGE OR ITCHING   Items with * indicate a potential emergency and should be followed up as soon as possible or go to the Emergency Department if any problems should occur.  Please show the CHEMOTHERAPY ALERT CARD or  IMMUNOTHERAPY ALERT CARD at check-in to the Emergency Department and triage nurse.  Should you have questions after your visit or need to cancel or reschedule your appointment, please contact [place] [facility]  [phone] and follow the prompts.  Office hours are 8:00 a.m. to 4:30 p.m. Monday - Friday. Please note that voicemails left after 4:00 p.m. may not be returned until the following business day.  We are closed weekends and major holidays. You have access to a nurse at all times for urgent questions. Please call the main number to the clinic [phone] and follow the prompts.  For any non-urgent questions, you may also contact your provider using MyChart. We now offer e-Visits for anyone 51 and older to request care online for non-urgent symptoms. For details visit [email or web address].   Also download the MyChart app! Go to the app store, search "MyChart", open the app, select [place], and log in with your MyChart username and password.

## 2023-01-25 ENCOUNTER — Inpatient Hospital Stay: Payer: Medicare Other | Admitting: Licensed Clinical Social Worker

## 2023-01-25 NOTE — Progress Notes (Signed)
01/24/23 1245: Per Dr. Alena Bills okay to run post hydration fluids with Cisplatin.

## 2023-01-25 NOTE — Progress Notes (Signed)
[facility] Clinical Social Work  Clinical Social Work was referred by  [facility]  for assessment of psychosocial needs.  Clinical Social Worker contacted patient by phone to offer support and assess for needs.    Patient reports feeling very fortunate that he has a good support system with no financial issues. Patient agreed to have CSW mail the [employer] and calendar to him.  He expressed no issues at this time.     Dorothey Baseman, LCSW  Clinical Social Worker [facility]

## 2023-01-26 ENCOUNTER — Telehealth: Payer: Self-pay

## 2023-01-26 ENCOUNTER — Inpatient Hospital Stay: Payer: Medicare Other

## 2023-01-26 DIAGNOSIS — Z5111 Encounter for antineoplastic chemotherapy: Secondary | ICD-10-CM | POA: Diagnosis not present

## 2023-01-26 DIAGNOSIS — C679 Malignant neoplasm of bladder, unspecified: Secondary | ICD-10-CM

## 2023-01-26 MED ORDER — PEGFILGRASTIM-FPGK 6 MG/0.6ML ~~LOC~~ SOSY
6.0000 mg | PREFILLED_SYRINGE | Freq: Once | SUBCUTANEOUS | Status: AC
  Administered 2023-01-26: 6 mg via SUBCUTANEOUS
  Filled 2023-01-26: qty 0.6

## 2023-01-26 NOTE — Telephone Encounter (Signed)
Telephone call to patient for follow up after receiving first infusion.   Patient states infusion went great.  States eating good and drinking plenty of fluids.   Denies any nausea or vomiting.  Encouraged patient to call for any concerns or questions. 

## 2023-01-30 ENCOUNTER — Inpatient Hospital Stay: Payer: Medicare Other

## 2023-01-30 ENCOUNTER — Inpatient Hospital Stay (HOSPITAL_BASED_OUTPATIENT_CLINIC_OR_DEPARTMENT_OTHER): Payer: Medicare Other | Admitting: Nurse Practitioner

## 2023-01-30 VITALS — BP 111/83 | HR 97 | Temp 97.2°F | Wt 161.0 lb

## 2023-01-30 DIAGNOSIS — C679 Malignant neoplasm of bladder, unspecified: Secondary | ICD-10-CM

## 2023-01-30 DIAGNOSIS — E860 Dehydration: Secondary | ICD-10-CM

## 2023-01-30 DIAGNOSIS — Z95828 Presence of other vascular implants and grafts: Secondary | ICD-10-CM

## 2023-01-30 DIAGNOSIS — G620 Drug-induced polyneuropathy: Secondary | ICD-10-CM

## 2023-01-30 DIAGNOSIS — H9313 Tinnitus, bilateral: Secondary | ICD-10-CM | POA: Diagnosis not present

## 2023-01-30 DIAGNOSIS — Z09 Encounter for follow-up examination after completed treatment for conditions other than malignant neoplasm: Secondary | ICD-10-CM

## 2023-01-30 DIAGNOSIS — Z5111 Encounter for antineoplastic chemotherapy: Secondary | ICD-10-CM | POA: Diagnosis not present

## 2023-01-30 DIAGNOSIS — N179 Acute kidney failure, unspecified: Secondary | ICD-10-CM | POA: Diagnosis not present

## 2023-01-30 DIAGNOSIS — T451X5A Adverse effect of antineoplastic and immunosuppressive drugs, initial encounter: Secondary | ICD-10-CM

## 2023-01-30 LAB — COMPREHENSIVE METABOLIC PANEL
ALT: 28 U/L (ref 0–44)
AST: 23 U/L (ref 15–41)
Albumin: 3.6 g/dL (ref 3.5–5.0)
Alkaline Phosphatase: 75 U/L (ref 38–126)
Anion gap: 8 (ref 5–15)
BUN: 55 mg/dL — ABNORMAL HIGH (ref 8–23)
CO2: 23 mmol/L (ref 22–32)
Calcium: 8 mg/dL — ABNORMAL LOW (ref 8.9–10.3)
Chloride: 101 mmol/L (ref 98–111)
Creatinine, Ser: 1.61 mg/dL — ABNORMAL HIGH (ref 0.61–1.24)
GFR, Estimated: 47 mL/min — ABNORMAL LOW (ref >60)
Glucose, Bld: 111 mg/dL — ABNORMAL HIGH (ref 70–99)
Potassium: 3.5 mmol/L (ref 3.5–5.1)
Sodium: 132 mmol/L — ABNORMAL LOW (ref 135–145)
Total Bilirubin: 0.7 mg/dL (ref 0.3–1.2)
Total Protein: 6.4 g/dL — ABNORMAL LOW (ref 6.5–8.1)

## 2023-01-30 LAB — CBC WITH DIFFERENTIAL/PLATELET
Abs Immature Granulocytes: 0.11 10*3/uL — ABNORMAL HIGH (ref 0.00–0.07)
Basophils Absolute: 0 10*3/uL (ref 0.0–0.1)
Basophils Relative: 1 %
Eosinophils Absolute: 0.1 10*3/uL (ref 0.0–0.5)
Eosinophils Relative: 2 %
HCT: 39.7 % (ref 39.0–52.0)
Hemoglobin: 13.8 g/dL (ref 13.0–17.0)
Immature Granulocytes: 2 %
Lymphocytes Relative: 27 %
Lymphs Abs: 1.5 10*3/uL (ref 0.7–4.0)
MCH: 32.9 pg (ref 26.0–34.0)
MCHC: 34.8 g/dL (ref 30.0–36.0)
MCV: 94.5 fL (ref 80.0–100.0)
Monocytes Absolute: 0.1 10*3/uL (ref 0.1–1.0)
Monocytes Relative: 2 %
Neutro Abs: 3.8 10*3/uL (ref 1.7–7.7)
Neutrophils Relative %: 66 %
Platelets: 97 10*3/uL — ABNORMAL LOW (ref 150–400)
RBC: 4.2 MIL/uL — ABNORMAL LOW (ref 4.22–5.81)
RDW: 12.1 % (ref 11.5–15.5)
WBC: 5.8 10*3/uL (ref 4.0–10.5)
nRBC: 0 % (ref 0.0–0.2)

## 2023-01-30 LAB — MAGNESIUM: Magnesium: 2.1 mg/dL (ref 1.7–2.4)

## 2023-01-30 MED ORDER — HEPARIN SOD (PORK) LOCK FLUSH 100 UNIT/ML IV SOLN
500.0000 [IU] | Freq: Once | INTRAVENOUS | Status: AC
  Administered 2023-01-30: 500 [IU]
  Filled 2023-01-30: qty 5

## 2023-01-30 MED ORDER — SODIUM CHLORIDE 0.9 % IV SOLN
1.0000 g | Freq: Once | INTRAVENOUS | Status: AC
  Administered 2023-01-30: 1 g via INTRAVENOUS
  Filled 2023-01-30: qty 10

## 2023-01-30 MED ORDER — SODIUM CHLORIDE 0.9 % IV SOLN
INTRAVENOUS | Status: DC
  Filled 2023-01-30 (×2): qty 250

## 2023-01-30 NOTE — Progress Notes (Unsigned)
[facility] CONSULT NOTE  Patient Care Team: Marisue Ivan, MD as PCP - General (Family Medicine) Michaelyn Barter, MD as Consulting Physician (Oncology)  REFERRING PROVIDER: Dr. Lonna Cobb  REASON FOR REFFERAL: bladder cancer  CANCER STAGING   Cancer Staging  Bladder cancer [facility]) Staging form: Urinary Bladder, AJCC 8th Edition - Clinical stage from 12/07/2022: Stage II (cT2, cN0, cM0) - Signed by Michaelyn Barter, MD on 01/02/2023 Stage prefix: Initial diagnosis WHO/ISUP grade (low/high): High Grade Histologic grading system: 2 grade system  HISTORY OF PRESENTING ILLNESS:  Ryan Dixon 67 Dixon.o. male with pmh of HTN, HLD, current smoker referred to Medical Oncology for management of bladder cancer.   Oncology History  Bladder cancer (HCC)  10/25/2022 Initial Diagnosis   Seen by Dr. Lonna Cobb for intermittent hematuria since February 2024 along with other urinary symptoms.   10/28/2022 Imaging   CT hematuria workup Showed 2.2 cm bladder mass worrisome for cancer.  Adjacent mild diffuse bladder wall thickening.  Based on the location this may involve the extreme distal aspect of the right UVJ.  Extensive atherosclerotic changes with the occlusion of the right common iliac artery and areas of significant stenosis along the left. Please correlate with any symptoms such as claudication and further workup as clinically appropriate.  CT chest with contrast IMPRESSION: 1. Lung-RADS 2, benign appearance or behavior. Continue annual screening with low-dose chest CT without contrast in 12 months. 2. Similar ascending aortic dilatation at 4.2 cm. Recommend attention on follow-up lung cancer screening CT in 1 year.   11/29/2022 Procedure   Intraoperative findings:  Cystoscopy-urethra normal in caliber without stricture.  Prostate with mild lateral lobe enlargement and moderate bladder neck elevation.  UOs normal-appearing bilaterally.  The right UO was anterior to the bladder  tumor Bladder tumor: 3 cm papillary/nodular tumor superior to right UO and extending laterally; ~ 1 cm papillary tumor anterior bladder neck 10:00; 2-1 cm papillary tumors anterior bladder neck 2:00    11/29/2022 Pathology Results        12/07/2022 Cancer Staging   Staging form: Urinary Bladder, AJCC 8th Edition - Clinical stage from 12/07/2022: Stage II (cT2, cN0, cM0) - Signed by Michaelyn Barter, MD on 01/02/2023 Stage prefix: Initial diagnosis WHO/ISUP grade (low/high): High Grade Histologic grading system: 2 grade system   01/02/2023 Initial Diagnosis   Bladder cancer (HCC)   01/23/2023 -  Chemotherapy   Patient is on Treatment Plan : BLADDER DOSE DENSE MVAC q14d       Interval history   Patient seen today prior to cycle 1 of dose dense and back accompanied with wife. Doing well overall.  Denies any new symptoms or concerns.  MEDICAL HISTORY:  Past Medical History:  Diagnosis Date   Aortic atherosclerosis (HCC)    Chronic foot pain, left    History of hematuria    Hypertension    Other emphysema (HCC)    Other male erectile dysfunction    Pre-diabetes    Pure hypercholesterolemia    Tobacco dependence     SURGICAL HISTORY: Past Surgical History:  Procedure Laterality Date   ANKLE ARTHROSCOPY Left 08/11/2020   Procedure: LEFT ANKLE ARTHROSCOPY AND DEBRIDEMENT;  Surgeon: Nadara Mustard, MD;  Location: [facility];  Service: Orthopedics;  Laterality: Left;   BLADDER INSTILLATION N/A 11/29/2022   Procedure: BLADDER INSTILLATION OF GEMCITABINE;  Surgeon: [name], MD;  Location: [facility];  Service: Urology;  Laterality: N/A;   COLONOSCOPY     IR IMAGING  GUIDED PORT INSERTION  01/20/2023   TONSILLECTOMY     TRANSURETHRAL RESECTION OF BLADDER TUMOR N/A 11/29/2022   Procedure: TRANSURETHRAL RESECTION OF BLADDER TUMOR (TURBT);  Surgeon: [name], MD;  Location: [facility];  Service: Urology;  Laterality: N/A;    SOCIAL HISTORY: Social History    Socioeconomic History   Marital status: Married    Spouse name: Ryan Dixon   Number of children: Not on file   Years of education: Not on file   Highest education level: Not on file  Occupational History   Not on file  Tobacco Use   Smoking status: Every Day    Current packs/day: 2.00    Average packs/day: 2.0 packs/day for 43.0 years (86.0 ttl pk-yrs)    Types: Cigarettes   Smokeless tobacco: Never  Substance and Sexual Activity   Alcohol use: Yes    Comment: 1-2 beers/day   Drug use: Never   Sexual activity: Not on file  Other Topics Concern   Not on file  Social History Narrative   Not on file   Social Determinants of Health   Financial Resource Strain: Low Risk  (01/25/2023)   Overall Financial Resource Strain (CARDIA)    Difficulty of Paying Living Expenses: Not hard at all  Food Insecurity: No Food Insecurity (01/25/2023)   Hunger Vital Sign    Worried About Running Out of Food in the Last Year: Never true    Ran Out of Food in the Last Year: Never true  Transportation Needs: No Transportation Needs (01/25/2023)   PRAPARE - Administrator, Civil Service (Medical): No    Lack of Transportation (Non-Medical): No  Physical Activity: Not on file  Stress: No Stress Concern Present (01/25/2023)   [employer] of Occupational Health - Occupational Stress Questionnaire    Feeling of Stress : Not at all  Social Connections: Not on file  Intimate Partner Violence: Not At Risk (01/25/2023)   Humiliation, Afraid, Rape, and Kick questionnaire    Fear of Current or Ex-Partner: No    Emotionally Abused: No    Physically Abused: No    Sexually Abused: No    FAMILY HISTORY: No family history on file.  ALLERGIES:  is allergic to nsaids and other.  MEDICATIONS:  Current Outpatient Medications  Medication Sig Dispense Refill   atorvastatin (LIPITOR) 20 MG tablet TAKE 1 TABLET BY MOUTH EVERY DAY AT NIGHT     dexamethasone (DECADRON) 4 MG tablet Take 2 tablets (8  mg) by mouth daily x 3 days starting the day after cisplatin chemotherapy. Take with food. 30 tablet 1   hydrochlorothiazide (HYDRODIURIL) 12.5 MG tablet Take 12.5 mg by mouth daily.     lidocaine-prilocaine (EMLA) cream Apply to affected area once 30 g 3   prochlorperazine (COMPAZINE) 10 MG tablet Take 1 tablet (10 mg total) by mouth every 6 (six) hours as needed (Nausea or vomiting). 30 tablet 1   HYDROcodone-acetaminophen (NORCO/VICODIN) 5-325 MG tablet Take 1 tablet by mouth every 6 (six) hours as needed for moderate pain. (Patient not taking: Reported on 01/02/2023) 8 tablet 0   ondansetron (ZOFRAN) 8 MG tablet Take 1 tablet (8 mg total) by mouth every 8 (eight) hours as needed for nausea or vomiting. Start on the third day after cisplatin. (Patient not taking: Reported on 01/23/2023) 30 tablet 1   phenazopyridine (PYRIDIUM) 200 MG tablet Take 1 tablet (200 mg total) by mouth 3 (three) times daily as needed (burning with urination). (Patient not taking:  Reported on 01/02/2023) 15 tablet 0   No current facility-administered medications for this visit.    REVIEW OF SYSTEMS:   Review of Systems  Constitutional:  Negative for chills, fever, malaise/fatigue and weight loss.  HENT:  Negative for hearing loss, nosebleeds, sore throat and tinnitus.   Eyes:  Negative for blurred vision and double vision.  Respiratory:  Negative for cough, hemoptysis, shortness of breath and wheezing.   Cardiovascular:  Negative for chest pain, palpitations and leg swelling.  Gastrointestinal:  Negative for abdominal pain, blood in stool, constipation, diarrhea, melena, nausea and vomiting.  Genitourinary:  Negative for dysuria and urgency.  Musculoskeletal:  Negative for back pain, falls, joint pain and myalgias.  Skin:  Negative for itching and rash.  Neurological:  Negative for dizziness, tingling, sensory change, loss of consciousness, weakness and headaches.  Endo/Heme/Allergies:  Negative for environmental  allergies. Does not bruise/bleed easily.  Psychiatric/Behavioral:  Negative for depression. The patient is not nervous/anxious and does not have insomnia.    PHYSICAL EXAMINATION: ECOG PERFORMANCE STATUS: 0 - Asymptomatic  Vitals:   01/30/23 0853  BP: 111/83  Pulse: 97  Temp: (!) 97.2 F (36.2 C)  SpO2: 100%   Filed Weights   01/30/23 0853  Weight: 161 lb (73 kg)   General: Well-developed, well-nourished, no acute distress. Eyes: Pink conjunctiva, anicteric sclera. Lungs: Clear to auscultation bilaterally.  No audible wheezing or coughing Heart: Regular rate and rhythm.  Abdomen: Soft, nontender, nondistended.  Musculoskeletal: No edema, cyanosis, or clubbing. Neuro: Alert, answering all questions appropriately. Cranial nerves grossly intact. Skin: No rashes or petechiae noted. Psych: Normal affect.   LABORATORY DATA:  I have reviewed the data as listed Lab Results  Component Value Date   WBC 5.8 01/30/2023   HGB 13.8 01/30/2023   HCT 39.7 01/30/2023   MCV 94.5 01/30/2023   PLT 97 (L) 01/30/2023   Recent Labs    01/02/23 1457 01/23/23 1021 01/30/23 0821  NA 138 134* 132*  K 3.7 3.7 3.5  CL 100 98 101  CO2 29 25 23   GLUCOSE 101* 97 111*  BUN 22 19 55*  CREATININE 1.18 1.08 1.61*  CALCIUM 8.7* 8.7* 8.0*  GFRNONAA >60 >60 47*  PROT 7.0 7.6 6.4*  ALBUMIN 4.0 4.2 3.6  AST 24 27 23   ALT 27 25 28   ALKPHOS 71 73 75  BILITOT 0.4 0.9 0.7    RADIOGRAPHIC STUDIES: I have personally reviewed the radiological images as listed and agreed with the findings in the report. IR IMAGING GUIDED PORT INSERTION  Result Date: 01/20/2023 INDICATION: Chemotherapy access EXAM: Placement of chest port using ultrasound and fluoroscopic guidance MEDICATIONS: Documented in the EMR ANESTHESIA/SEDATION: Moderate (conscious) sedation was employed during this procedure. A total of Versed 2 mg and Fentanyl 100 mcg was administered intravenously. Moderate Sedation Time: 25 minutes. The  patient's level of consciousness and vital signs were monitored continuously by radiology nursing throughout the procedure under my direct supervision. FLUOROSCOPY TIME:  Fluoroscopy Time: 0.6 minutes (2 mGy) COMPLICATIONS: None immediate. PROCEDURE: Informed written consent was obtained from the patient after a thorough discussion of the procedural risks, benefits and alternatives. All questions were addressed. Maximal Sterile Barrier Technique was utilized including caps, mask, sterile gowns, sterile gloves, sterile drape, hand hygiene and skin antiseptic. A timeout was performed prior to the initiation of the procedure. The patient was placed supine on the exam table. The right neck and chest was prepped and draped in the standard sterile fashion. A preliminary  ultrasound of the right neck was performed and demonstrates a patent right internal jugular vein. A permanent ultrasound image was stored in the electronic medical record. The overlying skin was anesthetized with 1% Lidocaine. Using ultrasound guidance, access was obtained into the right internal jugular vein using a 21 gauge micropuncture set. A wire was advanced into the SVC, a short incision was made at the puncture site, and serial dilatation performed. Next, in an ipsilateral infraclavicular location, an incision was made at the site of the subcutaneous reservoir. Blunt dissection was used to open a pocket to contain the reservoir. A subcutaneous tunnel was then created from the port site to the puncture site. A(n) 8 Fr single lumen catheter was advanced through the tunnel. The catheter was attached to the port and this was placed in the subcutaneous pocket. Under fluoroscopic guidance, a peel away sheath was placed, and the catheter was trimmed to the appropriate length and was advanced into the central veins. The catheter length is 24 cm. The tip of the catheter lies near the superior cavoatrial junction. The port flushes and aspirates  appropriately. The port was flushed and locked with heparinized saline. The port pocket was closed in 2 layers using 3-0 and 4-0 Vicryl/absorbable suture. Dermabond was also applied to both incisions. The patient tolerated the procedure well and was transferred to recovery in stable condition. IMPRESSION: Successful placement of a right-sided chest port via the right internal jugular vein. The port is ready for immediate use. Electronically Signed   By: Olive Bass M.D.   On: 01/20/2023 15:48   ECHOCARDIOGRAM COMPLETE  Result Date: 01/18/2023    ECHOCARDIOGRAM REPORT   Patient Name:   Ryan SIRCY Date of Exam: 01/18/2023 Medical Rec #:  [id number]    Height:       70.0 in Accession #:    [id number]   Weight:       166.6 lb Date of Birth:  September 22, 1955     BSA:          1.931 m Patient Age:    67 years     BP:           135/74 mmHg Patient Gender: M            HR:           75 bpm. Exam Location:  [facility] Procedure: 2D Echo, 3D Echo, Cardiac Doppler, Color Doppler, Strain Analysis and            Intracardiac Opacification Agent Indications:     Chemo  History:         Patient has no prior history of Echocardiogram examinations.                  Bladder CA.  Sonographer:     Mikki Harbor Referring Phys:  [id number] Michaelyn Barter Diagnosing Phys: Yvonne Kendall MD  Sonographer Comments: Global longitudinal strain was attempted. IMPRESSIONS  1. Left ventricular ejection fraction, by estimation, is 55 to 60%. The left ventricle has normal function. The left ventricle has no regional wall motion abnormalities. Left ventricular diastolic parameters were normal.  2. Right ventricular systolic function is normal. The right ventricular size is normal. There is normal pulmonary artery systolic pressure.  3. The mitral valve is normal in structure. Trivial mitral valve regurgitation. No evidence of mitral stenosis.  4. The aortic valve has an indeterminant number of cusps. There is mild calcification of the aortic valve. There  is moderate thickening of the  aortic valve. Aortic valve regurgitation is not visualized. Aortic valve sclerosis is present, with no evidence of aortic valve stenosis.  5. The inferior vena cava is normal in size with greater than 50% respiratory variability, suggesting right atrial pressure of 3 mmHg. FINDINGS  Left Ventricle: Left ventricular ejection fraction, by estimation, is 55 to 60%. The left ventricle has normal function. The left ventricle has no regional wall motion abnormalities. Definity contrast agent was given IV to delineate the left ventricular  endocardial borders. The left ventricular internal cavity size was normal in size. There is no left ventricular hypertrophy. Left ventricular diastolic parameters were normal. Right Ventricle: The right ventricular size is normal. No increase in right ventricular wall thickness. Right ventricular systolic function is normal. There is normal pulmonary artery systolic pressure. The tricuspid regurgitant velocity is 2.45 m/s, and  with an assumed right atrial pressure of 3 mmHg, the estimated right ventricular systolic pressure is 27.0 mmHg. Left Atrium: Left atrial size was normal in size. Right Atrium: Right atrial size was normal in size. Pericardium: There is no evidence of pericardial effusion. Mitral Valve: The mitral valve is normal in structure. Trivial mitral valve regurgitation. No evidence of mitral valve stenosis. MV peak gradient, 3.6 mmHg. The mean mitral valve gradient is 2.0 mmHg. Tricuspid Valve: The tricuspid valve is normal in structure. Tricuspid valve regurgitation is trivial. Aortic Valve: The aortic valve has an indeterminant number of cusps. There is mild calcification of the aortic valve. There is moderate thickening of the aortic valve. Aortic valve regurgitation is not visualized. Aortic valve sclerosis is present, with no evidence of aortic valve stenosis. Aortic valve mean gradient measures 6.0 mmHg. Aortic valve peak gradient  measures 12.5 mmHg. Aortic valve area, by VTI measures 1.76 cm. Pulmonic Valve: The pulmonic valve was normal in structure. Pulmonic valve regurgitation is not visualized. No evidence of pulmonic stenosis. Aorta: The aortic root and ascending aorta are structurally normal, with no evidence of dilitation. Pulmonary Artery: The pulmonary artery is of normal size. Venous: The inferior vena cava is normal in size with greater than 50% respiratory variability, suggesting right atrial pressure of 3 mmHg. IAS/Shunts: The interatrial septum appears to be lipomatous. The interatrial septum was not well visualized.  LEFT VENTRICLE PLAX 2D LVIDd:         4.50 cm   Diastology LVIDs:         2.70 cm   LV e' medial:    9.25 cm/s LV PW:         1.00 cm   LV E/e' medial:  11.1 LV IVS:        1.02 cm   LV e' lateral:   12.90 cm/s LVOT diam:     2.10 cm   LV E/e' lateral: 8.0 LV SV:         71 LV SV Index:   37        2D Longitudinal Strain LVOT Area:     3.46 cm  2D Strain GLS Avg:     -16.8 %  RIGHT VENTRICLE RV Basal diam:  3.55 cm RV Mid diam:    2.90 cm RV S prime:     11.50 cm/s TAPSE (M-mode): 2.2 cm LEFT ATRIUM             Index        RIGHT ATRIUM           Index LA diam:        2.80 cm 1.45  cm/m   RA Area:     12.90 cm LA Vol (A2C):   35.4 ml 18.33 ml/m  RA Volume:   30.90 ml  16.00 ml/m LA Vol (A4C):   29.5 ml 15.27 ml/m LA Biplane Vol: 33.2 ml 17.19 ml/m  AORTIC VALVE                     PULMONIC VALVE AV Area (Vmax):    1.98 cm      PV Vmax:       1.24 m/s AV Area (Vmean):   1.75 cm      PV Peak grad:  6.2 mmHg AV Area (VTI):     1.76 cm AV Vmax:           177.00 cm/s AV Vmean:          110.000 cm/s AV VTI:            0.405 m AV Peak Grad:      12.5 mmHg AV Mean Grad:      6.0 mmHg LVOT Vmax:         101.00 cm/s LVOT Vmean:        55.500 cm/s LVOT VTI:          0.206 m LVOT/AV VTI ratio: 0.51  AORTA Ao Root diam: 3.60 cm Ao Asc diam:  3.50 cm MITRAL VALVE                TRICUSPID VALVE MV Area (PHT): 3.33 cm      TR Peak grad:   24.0 mmHg MV Area VTI:   2.26 cm     TR Vmax:        245.00 cm/s MV Peak grad:  3.6 mmHg MV Mean grad:  2.0 mmHg     SHUNTS MV Vmax:       0.95 m/s     Systemic VTI:  0.21 m MV Vmean:      57.7 cm/s    Systemic Diam: 2.10 cm MV Decel Time: 228 msec MV E velocity: 103.00 cm/s MV A velocity: 102.00 cm/s MV E/A ratio:  1.01 Cristal Deer End MD Electronically signed by Yvonne Kendall MD Signature Date/Time: 01/18/2023/4:10:36 PM    Final    CT CHEST ABDOMEN PELVIS W CONTRAST  Result Date: 01/06/2023 CLINICAL DATA:  History of bladder cancer, staging. * Tracking Code: BO * EXAM: CT CHEST, ABDOMEN, AND PELVIS WITH CONTRAST TECHNIQUE: Multidetector CT imaging of the chest, abdomen and pelvis was performed following the standard protocol during bolus administration of intravenous contrast. RADIATION DOSE REDUCTION: This exam was performed according to the departmental dose-optimization program which includes automated exposure control, adjustment of the mA and/or kV according to patient size and/or use of iterative reconstruction technique. 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 Aortic atherosclerosis. No central pulmonary embolus on this nondedicated study. Mediastinum/Nodes: No pathologically enlarged mediastinal, hilar or axillary lymph nodes no suspicious thyroid nodule. The esophagus is grossly unremarkable Lungs/Pleura: No suspicious pulmonary nodules or masses calcified granulomas. Mild diffuse bronchial wall thickening centrilobular and paraseptal emphysema. No pleural effusion. No pneumothorax. Musculoskeletal: Subcutaneous cystic lesion in the paramedian upper back measuring 19 mm on image 9/2 is compatible with a sebaceous cyst no aggressive lytic or blastic lesion of bone. CT ABDOMEN PELVIS FINDINGS  Hepatobiliary: No suspicious hepatic lesion. Gallbladder  is unremarkable. No biliary ductal dilation. Pancreas: No pancreatic ductal dilation or evidence of acute inflammation. Spleen: No splenomegaly. Adrenals/Urinary Tract: Bilateral adrenal glands appear normal. No hydronephrosis. Kidneys demonstrate symmetric enhancement. Nodular wall thickening along the posterior urinary bladder on image 111/2. Stomach/Bowel: No radiopaque enteric contrast material was administered. Stomach is unremarkable for degree of distension. No pathologic dilation of small or large bowel. Colonic diverticulosis without findings of acute diverticulitis. Vascular/Lymphatic: Normal caliber abdominal aorta. Aortic atherosclerosis. Smooth IVC contours. The portal, splenic and superior mesenteric veins are patent. No pathologically enlarged abdominal or pelvic lymph nodes. Reproductive: Prostate is unremarkable. Other: Fat containing small right inguinal hernia. Left inguinal hernia contains fat and nonobstructed portion of sigmoid colon. Fat containing paraumbilical hernia. Musculoskeletal: No aggressive lytic or blastic lesion of bone. Multilevel degenerative changes spine. Degenerative change of the bilateral hips. IMPRESSION: 1. Nodular wall thickening along the posterior urinary bladder, compatible with known bladder cancer. 2. No evidence of metastatic disease in the chest, abdomen or pelvis. 3. 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 Attention on follow-up oncologic surveillance imaging. 4. Aortic Atherosclerosis (ICD10-I70.0) and Emphysema (ICD10-J43.9). Electronically Signed   By: Maudry Mayhew M.D.   On: 01/06/2023 16:09    ASSESSMENT & PLAN:  Ryan Dixon 67 Dixon.o. male with pmh of HTN, HLD, current smoker referred to Medical Oncology for management of bladder cancer.   # Bladder cancer, high grade, cT2 N0 M0 - Was seen by Dr. Lonna Cobb for intermittent hematuria.  Underwent cystoscopy  with biopsy of mass close to right ureteral orifice, 2 masses from the bladder neck.  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 -Repeat CT chest abdomen pelvis from 01/06/2023 showed nodular wall thickening along the posterior urinary bladder consistent with known bladder cancer.  No evidence of metastatic disease in the chest abdomen pelvis.  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 - Echo showed normal EF of 55 to 60%. -Patient elected for chemotherapy with DD MVAC for 4-6 cycles. He receives methotrexate on D1 then cisplatin-doxorubicin-vinblastine on D2 then GCSF on D4.  - Currently s/p cycle 1. Here today for chemotherapy follow up. Tolerated treatment well.   - Hypocalcemia- calcium 8.0 today. Previously 8.7. Normal albumin. Likely related to AKI (see below). Plan for calcium gluconate 1 G IV today then start oral calcium, Tums extra strength (300 mg elemental calcium) twice a day.   - AKI- secondary to cisplatin chemotherapy. BN 55, Cr 1.61. Baseline BUN around 20, Cr 1.1. GFR normally > 60, today 47. Avoid nephrotoxic substances. 1L IVF today.   # Tinnitus- G1. Monitor.   # Peripheral neuropathy- mild. G1. Monitor.   # Supportive care: reviewed use of ondansetron and compazine as needed following chemotherapy for nausea/vomiting. Claritin for bone pain along with tylenol if needed. He will continue decadron for 3 days following chemotherapy.   # Hypertension- hydrochlorothiazide. BP improved. May be able to hold during chemotherapy.   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 Awaiting appt.   Disposition:  Fluids & calcium today Rtc on Friday for labs (bmp), +/- fluids- la RTC next week for labs, Dr Alena Bills, cycle 2 of DD MVAC chemotherapy with Stifufend support. - la  All questions were  answered. The patient knows to call the clinic with any problems, questions or concerns. No barriers to learning  was detected.  Thank you for allowing me to participate in the care of this pleasant patient.   Alinda Dooms, NP 01/30/2023

## 2023-01-30 NOTE — Progress Notes (Unsigned)
Patient is here due to ringing in his ears, which started on Friday after leaving here from getting an injection. No other symptoms with the ringing in both ears.

## 2023-02-01 ENCOUNTER — Telehealth: Payer: Self-pay | Admitting: *Deleted

## 2023-02-01 ENCOUNTER — Encounter: Payer: Self-pay | Admitting: Internal Medicine

## 2023-02-01 NOTE — Telephone Encounter (Signed)
Patient went for his labs with PCP in preparation for his appointment next week and they are concerned about his results and wants to know if we need to do anything about it. She reports that he did recently have a treatment with [place] 8/13 and has appointment Friday as well for lab and IV fluids . Please advise  CBC w/auto Differential (5 Part) Specimen: Blood Component Ref Range & Units Today Comments  WBC (White Blood Cell Count) 4.1 - 10.2 10^3/uL 1.8 Low Panic  CBC Repeated - Smear reviewed Notified nurse of WBC@ 08;46 AM  RBC (Red Blood Cell Count) 4.69 - 6.13 10^6/uL 4.17 Low    Hemoglobin 14.1 - 18.1 gm/dL 21.3 Low    Hematocrit 40.0 - 52.0 % 38.4 Low    MCV (Mean Corpuscular Volume) 80.0 - 100.0 fl 92.1   MCH (Mean Corpuscular Hemoglobin) 27.0 - 31.2 pg 33.1 High    MCHC (Mean Corpuscular Hemoglobin Concentration) 32.0 - 36.0 gm/dL 08.6   Platelet Count 578 - 450 10^3/uL 83 Low    RDW-CV (Red Cell Distribution Width) 11.6 - 14.8 % 11.9   MPV (Mean Platelet Volume) 9.4 - 12.4 fl 10.9   Neutrophils 1.50 - 7.80 10^3/uL 0.29 Low  Smear reviewed - Neutropenia present  Lymphocytes 1.00 - 3.60 10^3/uL 1.15   Monocytes 0.00 - 1.50 10^3/uL 0.25   Eosinophils 0.00 - 0.55 10^3/uL 0.05   Basophils 0.00 - 0.09 10^3/uL 0.01   Neutrophil % 32.0 - 70.0 % 16.5 Low    Lymphocyte % 10.0 - 50.0 % 65.3 High    Monocyte % 4.0 - 13.0 % 14.2 High    Eosinophil % 1.0 - 5.0 % 2.8   Basophil% 0.0 - 2.0 % 0.6   Immature Granulocyte % <=0.7 % 0.6   Immature Granulocyte Count <=0.06 10^3/L 0.01   Resulting Agency [facility]   Specimen Collected: 02/01/23 07:43   Performed by: [facility] Last Resulted: 02/01/23 10:16  Received From: [facility]  Result Received: 02/01/23 10:56

## 2023-02-01 NOTE — Telephone Encounter (Signed)
Call returned to patient but it went to voice mail so I called his wife and explained everything to her. She will relay info to patient and will notify us if his temp goes 100.5 or higher. Patient will keep appointment Friday for lab and IV fluids. Wife does report that patient is not eating much but thinks it is due to constipation fr which he is taking Miralax. I advised that he can add Colace or senna also daily, but she is going to make a concoction of applesauce and prunes for him to try I also called Amy at [facility] back to inform her of NP response.

## 2023-02-02 ENCOUNTER — Encounter: Payer: Self-pay | Admitting: Internal Medicine

## 2023-02-03 ENCOUNTER — Other Ambulatory Visit: Payer: Self-pay

## 2023-02-03 ENCOUNTER — Inpatient Hospital Stay: Payer: Medicare Other

## 2023-02-03 ENCOUNTER — Other Ambulatory Visit: Payer: Self-pay | Admitting: Internal Medicine

## 2023-02-03 ENCOUNTER — Encounter: Payer: Self-pay | Admitting: Internal Medicine

## 2023-02-03 ENCOUNTER — Telehealth: Payer: Self-pay

## 2023-02-03 DIAGNOSIS — E860 Dehydration: Secondary | ICD-10-CM

## 2023-02-03 DIAGNOSIS — H938X9 Other specified disorders of ear, unspecified ear: Secondary | ICD-10-CM

## 2023-02-03 DIAGNOSIS — Z5111 Encounter for antineoplastic chemotherapy: Secondary | ICD-10-CM | POA: Diagnosis not present

## 2023-02-03 LAB — BASIC METABOLIC PANEL - CANCER CENTER ONLY
Anion gap: 8 (ref 5–15)
BUN: 26 mg/dL — ABNORMAL HIGH (ref 8–23)
CO2: 26 mmol/L (ref 22–32)
Calcium: 8.4 mg/dL — ABNORMAL LOW (ref 8.9–10.3)
Chloride: 100 mmol/L (ref 98–111)
Creatinine: 1.48 mg/dL — ABNORMAL HIGH (ref 0.61–1.24)
GFR, Estimated: 52 mL/min — ABNORMAL LOW (ref >60)
Glucose, Bld: 102 mg/dL — ABNORMAL HIGH (ref 70–99)
Potassium: 3.3 mmol/L — ABNORMAL LOW (ref 3.5–5.1)
Sodium: 134 mmol/L — ABNORMAL LOW (ref 135–145)

## 2023-02-03 MED ORDER — SODIUM CHLORIDE 0.9 % IV SOLN
Freq: Once | INTRAVENOUS | Status: AC
  Filled 2023-02-03: qty 250

## 2023-02-03 MED ORDER — SODIUM CHLORIDE 0.9% FLUSH
10.0000 mL | Freq: Once | INTRAVENOUS | Status: AC
  Administered 2023-02-03: 10 mL via INTRAVENOUS
  Filled 2023-02-03: qty 10

## 2023-02-03 MED ORDER — HEPARIN SOD (PORK) LOCK FLUSH 100 UNIT/ML IV SOLN
500.0000 [IU] | Freq: Once | INTRAVENOUS | Status: AC
  Administered 2023-02-03: 500 [IU] via INTRAVENOUS
  Filled 2023-02-03: qty 5

## 2023-02-03 MED ORDER — POTASSIUM CHLORIDE CRYS ER 20 MEQ PO TBCR: 20.0000 meq | EXTENDED_RELEASE_TABLET | Freq: Every day | ORAL | 0 refills | Status: DC

## 2023-02-03 NOTE — Progress Notes (Signed)
Pt reports ringing in the ears persist. Dr Alena Bills aware

## 2023-02-03 NOTE — Telephone Encounter (Signed)
Faxed referral to [facility] @ [phone]

## 2023-02-05 ENCOUNTER — Other Ambulatory Visit: Payer: Self-pay

## 2023-02-06 ENCOUNTER — Inpatient Hospital Stay: Payer: Medicare Other

## 2023-02-06 ENCOUNTER — Inpatient Hospital Stay: Payer: Medicare Other | Admitting: Internal Medicine

## 2023-02-06 ENCOUNTER — Other Ambulatory Visit: Payer: Self-pay | Admitting: Internal Medicine

## 2023-02-06 ENCOUNTER — Encounter: Payer: Self-pay | Admitting: Internal Medicine

## 2023-02-07 ENCOUNTER — Inpatient Hospital Stay: Payer: Medicare Other

## 2023-02-07 NOTE — Progress Notes (Signed)
Nutrition  Patient was scheduled for nutrition visit today but did not show up.    Follow-up to be determined  Joesphine Schemm B. Freida Busman, RD, LDN Registered Dietitian [phone]

## 2023-02-08 ENCOUNTER — Other Ambulatory Visit: Payer: Self-pay

## 2023-02-09 ENCOUNTER — Ambulatory Visit: Payer: Medicare Other

## 2023-02-10 ENCOUNTER — Other Ambulatory Visit: Payer: Self-pay | Admitting: *Deleted

## 2023-02-10 DIAGNOSIS — C679 Malignant neoplasm of bladder, unspecified: Secondary | ICD-10-CM

## 2023-02-14 ENCOUNTER — Inpatient Hospital Stay: Payer: Medicare Other | Admitting: Internal Medicine

## 2023-02-14 ENCOUNTER — Inpatient Hospital Stay: Payer: Medicare Other | Attending: Internal Medicine

## 2023-02-14 VITALS — BP 125/75 | HR 80 | Temp 97.6°F | Wt 159.0 lb

## 2023-02-14 DIAGNOSIS — Z5111 Encounter for antineoplastic chemotherapy: Secondary | ICD-10-CM | POA: Insufficient documentation

## 2023-02-14 DIAGNOSIS — C679 Malignant neoplasm of bladder, unspecified: Secondary | ICD-10-CM | POA: Diagnosis not present

## 2023-02-14 DIAGNOSIS — T451X5A Adverse effect of antineoplastic and immunosuppressive drugs, initial encounter: Secondary | ICD-10-CM | POA: Diagnosis not present

## 2023-02-14 DIAGNOSIS — Z5189 Encounter for other specified aftercare: Secondary | ICD-10-CM | POA: Insufficient documentation

## 2023-02-14 DIAGNOSIS — N144 Toxic nephropathy, not elsewhere classified: Secondary | ICD-10-CM | POA: Diagnosis not present

## 2023-02-14 DIAGNOSIS — H938X3 Other specified disorders of ear, bilateral: Secondary | ICD-10-CM

## 2023-02-14 DIAGNOSIS — F17210 Nicotine dependence, cigarettes, uncomplicated: Secondary | ICD-10-CM | POA: Insufficient documentation

## 2023-02-14 LAB — CBC WITH DIFFERENTIAL/PLATELET
Abs Immature Granulocytes: 0.21 10*3/uL — ABNORMAL HIGH (ref 0.00–0.07)
Basophils Absolute: 0.1 10*3/uL (ref 0.0–0.1)
Basophils Relative: 1 %
Eosinophils Absolute: 0.1 10*3/uL (ref 0.0–0.5)
Eosinophils Relative: 1 %
HCT: 38.8 % — ABNORMAL LOW (ref 39.0–52.0)
Hemoglobin: 12.9 g/dL — ABNORMAL LOW (ref 13.0–17.0)
Immature Granulocytes: 2 %
Lymphocytes Relative: 20 %
Lymphs Abs: 2.1 10*3/uL (ref 0.7–4.0)
MCH: 31.9 pg (ref 26.0–34.0)
MCHC: 33.2 g/dL (ref 30.0–36.0)
MCV: 95.8 fL (ref 80.0–100.0)
Monocytes Absolute: 1 10*3/uL (ref 0.1–1.0)
Monocytes Relative: 10 %
Neutro Abs: 6.9 10*3/uL (ref 1.7–7.7)
Neutrophils Relative %: 66 %
Platelets: 437 10*3/uL — ABNORMAL HIGH (ref 150–400)
RBC: 4.05 MIL/uL — ABNORMAL LOW (ref 4.22–5.81)
RDW: 12.6 % (ref 11.5–15.5)
WBC: 10.3 10*3/uL (ref 4.0–10.5)
nRBC: 0 % (ref 0.0–0.2)

## 2023-02-14 LAB — CMP (CANCER CENTER ONLY)
ALT: 22 U/L (ref 0–44)
AST: 23 U/L (ref 15–41)
Albumin: 3.9 g/dL (ref 3.5–5.0)
Alkaline Phosphatase: 90 U/L (ref 38–126)
Anion gap: 11 (ref 5–15)
BUN: 31 mg/dL — ABNORMAL HIGH (ref 8–23)
CO2: 25 mmol/L (ref 22–32)
Calcium: 9 mg/dL (ref 8.9–10.3)
Chloride: 100 mmol/L (ref 98–111)
Creatinine: 1.26 mg/dL — ABNORMAL HIGH (ref 0.61–1.24)
GFR, Estimated: >60 mL/min (ref >60)
Glucose, Bld: 103 mg/dL — ABNORMAL HIGH (ref 70–99)
Potassium: 3.9 mmol/L (ref 3.5–5.1)
Sodium: 136 mmol/L (ref 135–145)
Total Bilirubin: 0.3 mg/dL (ref 0.3–1.2)
Total Protein: 7 g/dL (ref 6.5–8.1)

## 2023-02-14 LAB — MAGNESIUM: Magnesium: 2.1 mg/dL (ref 1.7–2.4)

## 2023-02-14 NOTE — Progress Notes (Signed)
[facility] CONSULT NOTE  Patient Care Team: Marisue Ivan, MD as PCP - General (Family Medicine) Michaelyn Barter, MD as Consulting Physician (Oncology)  REFERRING PROVIDER: Dr. Lonna Cobb  REASON FOR REFFERAL: bladder cancer  CANCER STAGING   Cancer Staging  Bladder cancer [facility]) Staging form: Urinary Bladder, AJCC 8th Edition - Clinical stage from 12/07/2022: Stage II (cT2, cN0, cM0) - Signed by Michaelyn Barter, MD on 01/02/2023 Stage prefix: Initial diagnosis WHO/ISUP grade (low/high): High Grade Histologic grading system: 2 grade system   ASSESSMENT & PLAN:  Ryan Dixon 67 y.o. male with pmh of HTN, HLD, current smoker referred to Medical Oncology for management of bladder cancer.   # Bladder cancer, high grade, cT2 N0 M0 -Was seen by Dr. Lonna Cobb for intermittent hematuria.  Underwent cystoscopy with biopsy of mass close to right ureteral orifice, 2 masses from the bladder neck.  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  -Repeat CT chest abdomen pelvis from 01/06/2023 showed nodular wall thickening along the posterior urinary bladder consistent with known bladder cancer.  No evidence of metastatic disease in the chest abdomen pelvis.  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  - Echo showed normal EF of 55 to 60%.  -Started cycle 1 ddMVAC on 01/23/2023.  Within 1 week of initiation, patient developed bilateral constant tinnitus and decreased hearing with high-pitched sound.  Also developed nephrotoxicity with creatinine peaking at 1.6.  Supported with IV fluids.  Urgent referral was sent to Dr. Jenne Campus, ENT.  Diagnosed with sensorineural hearing loss and was advised hearing aid.  Chemotherapy was placed on hold.  Patient reports some improvement in his tinnitus.  He likely has some baseline hearing issues which was exacerbated by platinum chemotherapy.  ENT was okay to continue  with chemotherapy.  I discussed in detail with the patient about the risks and the benefit of the treatment.  There is an important role of neoadjuvant chemotherapy in bladder cancer with improved survival and PFS.  At the same time, he is at high risk of worsening ototoxicity.  Patient would like to continue with the chemotherapy treatment.  I will do split dose of cisplatin with 15% dose reduction.  30 mg/m on day 1 and day 2.  He will need growth factor support.  He is going on a trip from September 14 to September 22.  He cannot cancel it.  Will have to resume his chemotherapy after he comes back.  If patient has worsening ototoxicity, I may consider chemotherapy or gem cis with a split dose on day 1 and day 8 to space out the platinum exposure.  # Platinum induced nephrotoxicity -Creatinine peaked to 1.6 with 4 cycles.  Supported with IV fluids.  Creatinine improved to 1.2 today.  Creatinine clearance is 59.6. -Will consider split dose of cisplatin  # Platinum induced ototoxicity -Manifested within 1 week of cycle 1 ddMVAC.  -Seen by Dr. Jenne Campus, ENT.  Likely had baseline hearing issues.  Diagnosed with sensorineural hearing loss.  Hearing aid was advised. -Planning split dose cisplatin with mild dose reduction.  Close monitoring.  Risk and benefit of the chemo was discussed with the patient and he like to proceed.  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   No orders of the defined types were placed in this  encounter.  RTC on September 23 for MD visit, labs, cycle 2 dose dense MVAC  The total time spent in the appointment was 30 minutes encounter with patients including review of chart and various tests results, discussions about plan of care and coordination of care plan   All questions were answered. The patient knows to call the  clinic with any problems, questions or concerns. No barriers to learning was detected.  Michaelyn Barter, MD 9/3/20242:15 PM   HISTORY OF PRESENTING ILLNESS:  Ryan Dixon 67 y.o. male with pmh of HTN, HLD, current smoker referred to Medical Oncology for management of bladder cancer.   Interval history Patient was seen today post cycle 1 to assess for toxicity. Patient was seen by ENT.  He does report improvement in tinnitus.  Patient continues to have issues with decreased hearing with certain frequencies.  Hearing aid was advised.  His chemotherapy has been on hold because of the toxicities.  I have reviewed his chart and materials related to his cancer extensively and collaborated history with the patient. Summary of oncologic history is as follows: Oncology History  Bladder cancer (HCC)  10/25/2022 Initial Diagnosis   Seen by Dr. Lonna Cobb for intermittent hematuria since February 2024 along with other urinary symptoms.   10/28/2022 Imaging   CT hematuria workup Showed 2.2 cm bladder mass worrisome for cancer.  Adjacent mild diffuse bladder wall thickening.  Based on the location this may involve the extreme distal aspect of the right UVJ.  Extensive atherosclerotic changes with the occlusion of the right common iliac artery and areas of significant stenosis along the left. Please correlate with any symptoms such as claudication and further workup as clinically appropriate.  CT chest with contrast IMPRESSION: 1. Lung-RADS 2, benign appearance or behavior. Continue annual screening with low-dose chest CT without contrast in 12 months. 2. Similar ascending aortic dilatation at 4.2 cm. Recommend attention on follow-up lung cancer screening CT in 1 year.   11/29/2022 Procedure   Intraoperative findings:  Cystoscopy-urethra normal in caliber without stricture.  Prostate with mild lateral lobe enlargement and moderate bladder neck elevation.  UOs normal-appearing bilaterally.  The right  UO was anterior to the bladder tumor Bladder tumor: 3 cm papillary/nodular tumor superior to right UO and extending laterally; ~ 1 cm papillary tumor anterior bladder neck 10:00; 2-1 cm papillary tumors anterior bladder neck 2:00    11/29/2022 Pathology Results        12/07/2022 Cancer Staging   Staging form: Urinary Bladder, AJCC 8th Edition - Clinical stage from 12/07/2022: Stage II (cT2, cN0, cM0) - Signed by Michaelyn Barter, MD on 01/02/2023 Stage prefix: Initial diagnosis WHO/ISUP grade (low/high): High Grade Histologic grading system: 2 grade system   01/02/2023 Initial Diagnosis   Bladder cancer (HCC)   01/23/2023 -  Chemotherapy   Patient is on Treatment Plan : BLADDER DOSE DENSE MVAC q14d       MEDICAL HISTORY:  Past Medical History:  Diagnosis Date   Aortic atherosclerosis (HCC)    Chronic foot pain, left    History of hematuria    Hypertension    Other emphysema (HCC)    Other male erectile dysfunction    Pre-diabetes    Pure hypercholesterolemia    Tobacco dependence     SURGICAL HISTORY: Past Surgical History:  Procedure Laterality Date   ANKLE ARTHROSCOPY Left 08/11/2020   Procedure: LEFT ANKLE ARTHROSCOPY AND DEBRIDEMENT;  Surgeon: Nadara Mustard, MD;  Location: [facility];  Service: Orthopedics;  Laterality: Left;   BLADDER INSTILLATION N/A 11/29/2022   Procedure: BLADDER INSTILLATION OF GEMCITABINE;  Surgeon: [name], MD;  Location: [facility];  Service: Urology;  Laterality: N/A;   COLONOSCOPY     IR IMAGING GUIDED PORT INSERTION  01/20/2023   TONSILLECTOMY     TRANSURETHRAL RESECTION OF BLADDER TUMOR N/A 11/29/2022   Procedure: TRANSURETHRAL RESECTION OF BLADDER TUMOR (TURBT);  Surgeon: [name], MD;  Location: [facility];  Service: Urology;  Laterality: N/A;    SOCIAL HISTORY: Social History   Socioeconomic History   Marital status: Married    Spouse name: Albin Felling   Number of children: Not on file   Years of education: Not  on file   Highest education level: Not on file  Occupational History   Not on file  Tobacco Use   Smoking status: Every Day    Current packs/day: 2.00    Average packs/day: 2.0 packs/day for 43.0 years (86.0 ttl pk-yrs)    Types: Cigarettes   Smokeless tobacco: Never  Substance and Sexual Activity   Alcohol use: Yes    Comment: 1-2 beers/day   Drug use: Never   Sexual activity: Not on file  Other Topics Concern   Not on file  Social History Narrative   Not on file   Social Determinants of Health   Financial Resource Strain: Low Risk  (02/08/2023)   Received from [facility]   Overall Financial Resource Strain (CARDIA)    Difficulty of Paying Living Expenses: Not hard at all  Food Insecurity: No Food Insecurity (02/08/2023)   Received from [facility]   Hunger Vital Sign    Worried About Running Out of Food in the Last Year: Never true    Ran Out of Food in the Last Year: Never true  Transportation Needs: No Transportation Needs (02/08/2023)   Received from [facility] - Transportation    In the past 12 months, has lack of transportation kept you from medical appointments or from getting medications?: No    Lack of Transportation (Non-Medical): No  Physical Activity: Not on file  Stress: No Stress Concern Present (01/25/2023)   [employer] [facility] [employer] - Occupational Stress Questionnaire    Feeling of Stress : Not at all  Social Connections: Not on file  Intimate Partner Violence: Not At Risk (01/25/2023)   Humiliation, Afraid, Rape, and Kick questionnaire    Fear of Current or Ex-Partner: No    Emotionally Abused: No    Physically Abused: No    Sexually Abused: No    FAMILY HISTORY: No family history on file.  ALLERGIES:  is allergic to nsaids and other.  MEDICATIONS:  Current Outpatient Medications  Medication Sig Dispense Refill   atorvastatin (LIPITOR) 20 MG tablet TAKE 1 TABLET BY  MOUTH EVERY DAY AT NIGHT     dexamethasone (DECADRON) 4 MG tablet Take 2 tablets (8 mg) by mouth daily x 3 days starting the day after cisplatin chemotherapy. Take with food. 30 tablet 1   hydrochlorothiazide (HYDRODIURIL) 12.5 MG tablet Take 12.5 mg by mouth daily.     HYDROcodone-acetaminophen (NORCO/VICODIN) 5-325 MG tablet Take 1 tablet by mouth every 6 (six) hours as needed for moderate pain. 8 tablet 0   lidocaine-prilocaine (EMLA) cream Apply to affected area once 30 g 3   ondansetron (ZOFRAN) 8 MG tablet Take 1 tablet (8 mg total) by mouth every 8 (eight) hours as needed for nausea  or vomiting. Start on the third day after cisplatin. 30 tablet 1   prochlorperazine (COMPAZINE) 10 MG tablet Take 1 tablet (10 mg total) by mouth every 6 (six) hours as needed (Nausea or vomiting). 30 tablet 1   phenazopyridine (PYRIDIUM) 200 MG tablet Take 1 tablet (200 mg total) by mouth 3 (three) times daily as needed (burning with urination). (Patient not taking: Reported on 01/02/2023) 15 tablet 0   No current facility-administered medications for this visit.    REVIEW OF SYSTEMS:   Pertinent information mentioned in HPI All other systems were reviewed with the patient and are negative.  PHYSICAL EXAMINATION: ECOG PERFORMANCE STATUS: 0 - Asymptomatic  Vitals:   02/14/23 1040  BP: 125/75  Pulse: 80  Temp: 97.6 F (36.4 C)  SpO2: 100%   Filed Weights   02/14/23 1040  Weight: 159 lb (72.1 kg)    GENERAL:alert, no distress and comfortable SKIN: skin color, texture, turgor are normal, no rashes or significant lesions EYES: normal, conjunctiva are pink and non-injected, sclera clear OROPHARYNX:no exudate, no erythema and lips, buccal mucosa, and tongue normal  NECK: supple, thyroid normal size, non-tender, without nodularity LYMPH:  no palpable lymphadenopathy in the cervical, axillary or inguinal LUNGS: clear to auscultation and percussion with normal breathing effort HEART: regular rate &  rhythm and no murmurs and no lower extremity edema ABDOMEN:abdomen soft, non-tender and normal bowel sounds Musculoskeletal:no cyanosis of digits and no clubbing  PSYCH: alert & oriented x 3 with fluent speech NEURO: no focal motor/sensory deficits  LABORATORY DATA:  I have reviewed the data as listed Lab Results  Component Value Date   WBC 10.3 02/14/2023   HGB 12.9 (L) 02/14/2023   HCT 38.8 (L) 02/14/2023   MCV 95.8 02/14/2023   PLT 437 (H) 02/14/2023   Recent Labs    01/23/23 1021 01/30/23 0821 02/03/23 0840 02/14/23 1035  NA 134* 132* 134* 136  K 3.7 3.5 3.3* 3.9  CL 98 101 100 100  CO2 25 23 26 25   GLUCOSE 97 111* 102* 103*  BUN 19 55* 26* 31*  CREATININE 1.08 1.61* 1.48* 1.26*  CALCIUM 8.7* 8.0* 8.4* 9.0  GFRNONAA >60 47* 52* >60  PROT 7.6 6.4*  --  7.0  ALBUMIN 4.2 3.6  --  3.9  AST 27 23  --  23  ALT 25 28  --  22  ALKPHOS 73 75  --  90  BILITOT 0.9 0.7  --  0.3    RADIOGRAPHIC STUDIES: I have personally reviewed the radiological images as listed and agreed with the findings in the report. IR IMAGING GUIDED PORT INSERTION  Result Date: 01/20/2023 INDICATION: Chemotherapy access EXAM: Placement of chest port using ultrasound and fluoroscopic guidance MEDICATIONS: Documented in the EMR ANESTHESIA/SEDATION: Moderate (conscious) sedation was employed during this procedure. A total of Versed 2 mg and Fentanyl 100 mcg was administered intravenously. Moderate Sedation Time: 25 minutes. The patient's level of consciousness and vital signs were monitored continuously by radiology nursing throughout the procedure under my direct supervision. FLUOROSCOPY TIME:  Fluoroscopy Time: 0.6 minutes (2 mGy) COMPLICATIONS: None immediate. PROCEDURE: Informed written consent was obtained from the patient after a thorough discussion of the procedural risks, benefits and alternatives. All questions were addressed. Maximal Sterile Barrier Technique was utilized including caps, mask, sterile  gowns, sterile gloves, sterile drape, hand hygiene and skin antiseptic. A timeout was performed prior to the initiation of the procedure. The patient was placed supine on the exam table. The right  neck and chest was prepped and draped in the standard sterile fashion. A preliminary ultrasound of the right neck was performed and demonstrates a patent right internal jugular vein. A permanent ultrasound image was stored in the electronic medical record. The overlying skin was anesthetized with 1% Lidocaine. Using ultrasound guidance, access was obtained into the right internal jugular vein using a 21 gauge micropuncture set. A wire was advanced into the SVC, a short incision was made at the puncture site, and serial dilatation performed. Next, in an ipsilateral infraclavicular location, an incision was made at the site of the subcutaneous reservoir. Blunt dissection was used to open a pocket to contain the reservoir. A subcutaneous tunnel was then created from the port site to the puncture site. A(n) 8 Fr single lumen catheter was advanced through the tunnel. The catheter was attached to the port and this was placed in the subcutaneous pocket. Under fluoroscopic guidance, a peel away sheath was placed, and the catheter was trimmed to the appropriate length and was advanced into the central veins. The catheter length is 24 cm. The tip of the catheter lies near the superior cavoatrial junction. The port flushes and aspirates appropriately. The port was flushed and locked with heparinized saline. The port pocket was closed in 2 layers using 3-0 and 4-0 Vicryl/absorbable suture. Dermabond was also applied to both incisions. The patient tolerated the procedure well and was transferred to recovery in stable condition. IMPRESSION: Successful placement of a right-sided chest port via the right internal jugular vein. The port is ready for immediate use. Electronically Signed   By: Olive Bass M.D.   On: 01/20/2023 15:48    ECHOCARDIOGRAM COMPLETE  Result Date: 01/18/2023    ECHOCARDIOGRAM REPORT   Patient Name:   Ryan Dixon Date of Exam: 01/18/2023 Medical Rec #:  [id number]    Height:       70.0 in Accession #:    [id number]   Weight:       166.6 lb Date of Birth:  Sep 09, 1955     BSA:          1.931 m Patient Age:    67 years     BP:           135/74 mmHg Patient Gender: M            HR:           75 bpm. Exam Location:  [facility] Procedure: 2D Echo, 3D Echo, Cardiac Doppler, Color Doppler, Strain Analysis and            Intracardiac Opacification Agent Indications:     Chemo  History:         Patient has no prior history of Echocardiogram examinations.                  Bladder CA.  Sonographer:     Mikki Harbor Referring Phys:  [id number] Michaelyn Barter Diagnosing Phys: Yvonne Kendall MD  Sonographer Comments: Global longitudinal strain was attempted. IMPRESSIONS  1. Left ventricular ejection fraction, by estimation, is 55 to 60%. The left ventricle has normal function. The left ventricle has no regional wall motion abnormalities. Left ventricular diastolic parameters were normal.  2. Right ventricular systolic function is normal. The right ventricular size is normal. There is normal pulmonary artery systolic pressure.  3. The mitral valve is normal in structure. Trivial mitral valve regurgitation. No evidence of mitral stenosis.  4. The aortic valve has an indeterminant number of cusps. There  is mild calcification of the aortic valve. There is moderate thickening of the aortic valve. Aortic valve regurgitation is not visualized. Aortic valve sclerosis is present, with no evidence of aortic valve stenosis.  5. The inferior vena cava is normal in size with greater than 50% respiratory variability, suggesting right atrial pressure of 3 mmHg. FINDINGS  Left Ventricle: Left ventricular ejection fraction, by estimation, is 55 to 60%. The left ventricle has normal function. The left ventricle has no regional wall motion abnormalities.  Definity contrast agent was given IV to delineate the left ventricular  endocardial borders. The left ventricular internal cavity size was normal in size. There is no left ventricular hypertrophy. Left ventricular diastolic parameters were normal. Right Ventricle: The right ventricular size is normal. No increase in right ventricular wall thickness. Right ventricular systolic function is normal. There is normal pulmonary artery systolic pressure. The tricuspid regurgitant velocity is 2.45 m/s, and  with an assumed right atrial pressure of 3 mmHg, the estimated right ventricular systolic pressure is 27.0 mmHg. Left Atrium: Left atrial size was normal in size. Right Atrium: Right atrial size was normal in size. Pericardium: There is no evidence of pericardial effusion. Mitral Valve: The mitral valve is normal in structure. Trivial mitral valve regurgitation. No evidence of mitral valve stenosis. MV peak gradient, 3.6 mmHg. The mean mitral valve gradient is 2.0 mmHg. Tricuspid Valve: The tricuspid valve is normal in structure. Tricuspid valve regurgitation is trivial. Aortic Valve: The aortic valve has an indeterminant number of cusps. There is mild calcification of the aortic valve. There is moderate thickening of the aortic valve. Aortic valve regurgitation is not visualized. Aortic valve sclerosis is present, with no evidence of aortic valve stenosis. Aortic valve mean gradient measures 6.0 mmHg. Aortic valve peak gradient measures 12.5 mmHg. Aortic valve area, by VTI measures 1.76 cm. Pulmonic Valve: The pulmonic valve was normal in structure. Pulmonic valve regurgitation is not visualized. No evidence of pulmonic stenosis. Aorta: The aortic root and ascending aorta are structurally normal, with no evidence of dilitation. Pulmonary Artery: The pulmonary artery is of normal size. Venous: The inferior vena cava is normal in size with greater than 50% respiratory variability, suggesting right atrial pressure of 3  mmHg. IAS/Shunts: The interatrial septum appears to be lipomatous. The interatrial septum was not well visualized.  LEFT VENTRICLE PLAX 2D LVIDd:         4.50 cm   Diastology LVIDs:         2.70 cm   LV e' medial:    9.25 cm/s LV PW:         1.00 cm   LV E/e' medial:  11.1 LV IVS:        1.02 cm   LV e' lateral:   12.90 cm/s LVOT diam:     2.10 cm   LV E/e' lateral: 8.0 LV SV:         71 LV SV Index:   37        2D Longitudinal Strain LVOT Area:     3.46 cm  2D Strain GLS Avg:     -16.8 %  RIGHT VENTRICLE RV Basal diam:  3.55 cm RV Mid diam:    2.90 cm RV S prime:     11.50 cm/s TAPSE (M-mode): 2.2 cm LEFT ATRIUM             Index        RIGHT ATRIUM  Index LA diam:        2.80 cm 1.45 cm/m   RA Area:     12.90 cm LA Vol (A2C):   35.4 ml 18.33 ml/m  RA Volume:   30.90 ml  16.00 ml/m LA Vol (A4C):   29.5 ml 15.27 ml/m LA Biplane Vol: 33.2 ml 17.19 ml/m  AORTIC VALVE                     PULMONIC VALVE AV Area (Vmax):    1.98 cm      PV Vmax:       1.24 m/s AV Area (Vmean):   1.75 cm      PV Peak grad:  6.2 mmHg AV Area (VTI):     1.76 cm AV Vmax:           177.00 cm/s AV Vmean:          110.000 cm/s AV VTI:            0.405 m AV Peak Grad:      12.5 mmHg AV Mean Grad:      6.0 mmHg LVOT Vmax:         101.00 cm/s LVOT Vmean:        55.500 cm/s LVOT VTI:          0.206 m LVOT/AV VTI ratio: 0.51  AORTA Ao Root diam: 3.60 cm Ao Asc diam:  3.50 cm MITRAL VALVE                TRICUSPID VALVE MV Area (PHT): 3.33 cm     TR Peak grad:   24.0 mmHg MV Area VTI:   2.26 cm     TR Vmax:        245.00 cm/s MV Peak grad:  3.6 mmHg MV Mean grad:  2.0 mmHg     SHUNTS MV Vmax:       0.95 m/s     Systemic VTI:  0.21 m MV Vmean:      57.7 cm/s    Systemic Diam: 2.10 cm MV Decel Time: 228 msec MV E velocity: 103.00 cm/s MV A velocity: 102.00 cm/s MV E/A ratio:  1.01 Yvonne Kendall MD Electronically signed by Yvonne Kendall MD Signature Date/Time: 01/18/2023/4:10:36 PM    Final

## 2023-02-14 NOTE — Progress Notes (Signed)
Patient says that the ear doctor told him that he needed to get some hearing aids and that would help with the ringing in his ears.

## 2023-02-15 ENCOUNTER — Encounter: Payer: Self-pay | Admitting: Internal Medicine

## 2023-02-15 NOTE — Addendum Note (Signed)
Addended byMichaelyn Barter on: 02/15/2023 06:16 PM   Modules accepted: Orders

## 2023-02-17 ENCOUNTER — Other Ambulatory Visit: Payer: Self-pay | Admitting: Internal Medicine

## 2023-02-17 ENCOUNTER — Other Ambulatory Visit: Payer: Self-pay

## 2023-02-17 ENCOUNTER — Encounter: Payer: Self-pay | Admitting: Internal Medicine

## 2023-02-18 ENCOUNTER — Other Ambulatory Visit: Payer: Self-pay

## 2023-02-20 ENCOUNTER — Encounter: Payer: Self-pay | Admitting: Internal Medicine

## 2023-03-02 ENCOUNTER — Encounter: Payer: Self-pay | Admitting: Internal Medicine

## 2023-03-06 ENCOUNTER — Inpatient Hospital Stay: Payer: Medicare Other

## 2023-03-06 ENCOUNTER — Inpatient Hospital Stay (HOSPITAL_BASED_OUTPATIENT_CLINIC_OR_DEPARTMENT_OTHER): Payer: Medicare Other | Admitting: Internal Medicine

## 2023-03-06 ENCOUNTER — Encounter: Payer: Self-pay | Admitting: Internal Medicine

## 2023-03-06 VITALS — BP 142/74 | HR 75 | Temp 97.6°F | Wt 165.0 lb

## 2023-03-06 DIAGNOSIS — H938X3 Other specified disorders of ear, bilateral: Secondary | ICD-10-CM

## 2023-03-06 DIAGNOSIS — T451X5A Adverse effect of antineoplastic and immunosuppressive drugs, initial encounter: Secondary | ICD-10-CM

## 2023-03-06 DIAGNOSIS — Z5111 Encounter for antineoplastic chemotherapy: Secondary | ICD-10-CM

## 2023-03-06 DIAGNOSIS — C679 Malignant neoplasm of bladder, unspecified: Secondary | ICD-10-CM

## 2023-03-06 LAB — CMP (CANCER CENTER ONLY)
ALT: 16 U/L (ref 0–44)
AST: 19 U/L (ref 15–41)
Albumin: 3.8 g/dL (ref 3.5–5.0)
Alkaline Phosphatase: 74 U/L (ref 38–126)
Anion gap: 9 (ref 5–15)
BUN: 17 mg/dL (ref 8–23)
CO2: 27 mmol/L (ref 22–32)
Calcium: 8.9 mg/dL (ref 8.9–10.3)
Chloride: 103 mmol/L (ref 98–111)
Creatinine: 1.12 mg/dL (ref 0.61–1.24)
GFR, Estimated: >60 mL/min (ref >60)
Glucose, Bld: 110 mg/dL — ABNORMAL HIGH (ref 70–99)
Potassium: 4 mmol/L (ref 3.5–5.1)
Sodium: 139 mmol/L (ref 135–145)
Total Bilirubin: 0.6 mg/dL (ref 0.3–1.2)
Total Protein: 6.6 g/dL (ref 6.5–8.1)

## 2023-03-06 LAB — CBC WITH DIFFERENTIAL (CANCER CENTER ONLY)
Abs Immature Granulocytes: 0.05 10*3/uL (ref 0.00–0.07)
Basophils Absolute: 0.1 10*3/uL (ref 0.0–0.1)
Basophils Relative: 1 %
Eosinophils Absolute: 0.6 10*3/uL — ABNORMAL HIGH (ref 0.0–0.5)
Eosinophils Relative: 6 %
HCT: 36.5 % — ABNORMAL LOW (ref 39.0–52.0)
Hemoglobin: 12.2 g/dL — ABNORMAL LOW (ref 13.0–17.0)
Immature Granulocytes: 1 %
Lymphocytes Relative: 18 %
Lymphs Abs: 1.9 10*3/uL (ref 0.7–4.0)
MCH: 32 pg (ref 26.0–34.0)
MCHC: 33.4 g/dL (ref 30.0–36.0)
MCV: 95.8 fL (ref 80.0–100.0)
Monocytes Absolute: 1 10*3/uL (ref 0.1–1.0)
Monocytes Relative: 10 %
Neutro Abs: 6.8 10*3/uL (ref 1.7–7.7)
Neutrophils Relative %: 64 %
Platelet Count: 231 10*3/uL (ref 150–400)
RBC: 3.81 MIL/uL — ABNORMAL LOW (ref 4.22–5.81)
RDW: 13.8 % (ref 11.5–15.5)
WBC Count: 10.4 10*3/uL (ref 4.0–10.5)
nRBC: 0 % (ref 0.0–0.2)

## 2023-03-06 LAB — MAGNESIUM: Magnesium: 2.2 mg/dL (ref 1.7–2.4)

## 2023-03-06 MED FILL — Fosaprepitant Dimeglumine For IV Infusion 150 MG (Base Eq): INTRAVENOUS | Qty: 5 | Status: AC

## 2023-03-06 MED FILL — Dexamethasone Sodium Phosphate Inj 100 MG/10ML: INTRAMUSCULAR | Qty: 1 | Status: AC

## 2023-03-06 NOTE — Progress Notes (Signed)
Patient says that he is doing about the same as he was during his last visit. He has no new questions or concerns for the doctor today.

## 2023-03-06 NOTE — Progress Notes (Signed)
[facility] CONSULT NOTE  Patient Care Team: Marisue Ivan, MD as PCP - General (Family Medicine) Michaelyn Barter, MD as Consulting Physician (Oncology)   CANCER STAGING   Cancer Staging  Bladder cancer [facility]) Staging form: Urinary Bladder, AJCC 8th Edition - Clinical stage from 12/07/2022: Stage II (cT2, cN0, cM0) - Signed by Michaelyn Barter, MD on 01/02/2023 Stage prefix: Initial diagnosis WHO/ISUP grade (low/high): High Grade Histologic grading system: 2 grade system   ASSESSMENT & PLAN:  Ryan Dixon 67 y.o. male with pmh of HTN, HLD, current smoker referred to Medical Oncology for management of bladder cancer.   # Bladder cancer, high grade, cT2 N0 M0 -Was seen by Dr. Lonna Cobb for intermittent hematuria.  Underwent cystoscopy with biopsy of mass close to right ureteral orifice, 2 masses from the bladder neck.  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  -Repeat CT chest abdomen pelvis from 01/06/2023 showed nodular wall thickening along the posterior urinary bladder consistent with known bladder cancer.  No evidence of metastatic disease in the chest abdomen pelvis.  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  - Echo showed normal EF of 55 to 60%.  -Started cycle 1 ddMVAC on 01/23/2023.  Within 1 week of initiation, patient developed bilateral constant tinnitus and decreased hearing with high-pitched sound.  Also developed nephrotoxicity with creatinine peaking at 1.6.  Supported with IV fluids. Evaluated by Dr. Jenne Campus, ENT.  Diagnosed with sensorineural hearing loss and was advised hearing aid.  Chemotherapy was placed on hold.  Patient reports some improvement in his tinnitus.  He likely has some baseline hearing issues which was exacerbated by platinum chemotherapy.  ENT was okay to continue with chemotherapy.   -Risk and benefit of continuation of platinum based chemo with  worsening ototoxicity was discussed with the patient.  Patient understands and would like to proceed.  Will split cisplatin 30 mg/m day 1 and day 2 with dose reduction.  Labs reviewed and acceptable for treatment.  Neulasta injection on Thursday.  # Platinum induced nephrotoxicity -Split dose cisplatin.  Support with IV's.  # Platinum induced ototoxicity -Manifested within 1 week of cycle 1 ddMVAC.  -Seen by Dr. Jenne Campus, ENT.  Likely had baseline hearing issues.  Diagnosed with sensorineural hearing loss.  Hearing aid was advised. -Planning split dose cisplatin with mild dose reduction.  Close monitoring.  Risk and benefit of the chemo was discussed with the patient and he like to proceed.  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   Orders Placed This Encounter  Procedures   CBC with Differential (Cancer Center Only)    Standing Status:   Future    Standing Expiration Date:   03/05/2024   CMP (Cancer Center only)    Standing Status:   Future    Standing Expiration Date:   03/05/2024   Magnesium    Standing Status:   Future    Standing Expiration Date:   03/05/2024   RTC in 1 week for MD visit, labs, fluids. RTC in 2 weeks for MD visit, labs, cycle 3 dose dense MVAC  The total time spent in the appointment was 30 minutes encounter with patients including review of chart and various tests results, discussions about plan of care and coordination of care plan   All questions were answered.  The patient knows to call the clinic with any problems, questions or concerns. No barriers to learning was detected.  Michaelyn Barter, MD 9/23/202412:38 PM   HISTORY OF PRESENTING ILLNESS:  Ryan Dixon 67 y.o. male with pmh of HTN, HLD, current smoker referred to Medical Oncology for management of bladder cancer.   Interval history Patient  was seen today as follow-up prior to cycle 2 of dose dense MVAC. Reports mild improvement in tinnitus.  Has not scheduled appointment for hearing aid.  Otherwise no concerns.  I have reviewed his chart and materials related to his cancer extensively and collaborated history with the patient. Summary of oncologic history is as follows: Oncology History  Bladder cancer (HCC)  10/25/2022 Initial Diagnosis   Seen by Dr. Lonna Cobb for intermittent hematuria since February 2024 along with other urinary symptoms.   10/28/2022 Imaging   CT hematuria workup Showed 2.2 cm bladder mass worrisome for cancer.  Adjacent mild diffuse bladder wall thickening.  Based on the location this may involve the extreme distal aspect of the right UVJ.  Extensive atherosclerotic changes with the occlusion of the right common iliac artery and areas of significant stenosis along the left. Please correlate with any symptoms such as claudication and further workup as clinically appropriate.  CT chest with contrast IMPRESSION: 1. Lung-RADS 2, benign appearance or behavior. Continue annual screening with low-dose chest CT without contrast in 12 months. 2. Similar ascending aortic dilatation at 4.2 cm. Recommend attention on follow-up lung cancer screening CT in 1 year.   11/29/2022 Procedure   Intraoperative findings:  Cystoscopy-urethra normal in caliber without stricture.  Prostate with mild lateral lobe enlargement and moderate bladder neck elevation.  UOs normal-appearing bilaterally.  The right UO was anterior to the bladder tumor Bladder tumor: 3 cm papillary/nodular tumor superior to right UO and extending laterally; ~ 1 cm papillary tumor anterior bladder neck 10:00; 2-1 cm papillary tumors anterior bladder neck 2:00    11/29/2022 Pathology Results        12/07/2022 Cancer Staging   Staging form: Urinary Bladder, AJCC 8th Edition - Clinical stage from 12/07/2022: Stage II (cT2, cN0, cM0) - Signed by Michaelyn Barter, MD on 01/02/2023 Stage prefix: Initial diagnosis WHO/ISUP grade (low/high): High Grade Histologic grading system: 2 grade system   01/02/2023 Initial Diagnosis   Bladder cancer (HCC)   01/23/2023 -  Chemotherapy   Patient is on Treatment Plan : BLADDER DOSE DENSE MVAC q14d       MEDICAL HISTORY:  Past Medical History:  Diagnosis Date   Aortic atherosclerosis (HCC)    Chronic foot pain, left    History of hematuria    Hypertension    Other emphysema (HCC)    Other male erectile dysfunction    Pre-diabetes    Pure hypercholesterolemia    Tobacco dependence     SURGICAL HISTORY: Past Surgical History:  Procedure Laterality Date   ANKLE ARTHROSCOPY Left 08/11/2020   Procedure: LEFT ANKLE ARTHROSCOPY AND DEBRIDEMENT;  Surgeon: Nadara Mustard, MD;  Location:  SURGERY CENTER;  Service: Orthopedics;  Laterality: Left;   BLADDER INSTILLATION N/A 11/29/2022   Procedure: BLADDER INSTILLATION OF GEMCITABINE;  Surgeon: [name], MD;  Location: [facility];  Service: Urology;  Laterality: N/A;   COLONOSCOPY     IR IMAGING GUIDED PORT INSERTION  01/20/2023   TONSILLECTOMY     TRANSURETHRAL RESECTION OF BLADDER TUMOR N/A 11/29/2022   Procedure: TRANSURETHRAL RESECTION OF BLADDER TUMOR (TURBT);  Surgeon: Irineo Axon  C, MD;  Location: [facility];  Service: Urology;  Laterality: N/A;    SOCIAL HISTORY: Social History   Socioeconomic History   Marital status: Married    Spouse name: Albin Felling   Number of children: Not on file   Years of education: Not on file   Highest education level: Not on file  Occupational History   Not on file  Tobacco Use   Smoking status: Every Day    Current packs/day: 2.00    Average packs/day: 2.0 packs/day for 43.0 years (86.0 ttl pk-yrs)    Types: Cigarettes   Smokeless tobacco: Never  Substance and Sexual Activity   Alcohol use: Yes    Comment: 1-2 beers/day   Drug use: Never   Sexual activity: Not on file  Other Topics Concern    Not on file  Social History Narrative   Not on file   Social Determinants of Health   Financial Resource Strain: Low Risk  (02/08/2023)   Received from [facility]   Overall Financial Resource Strain (CARDIA)    Difficulty of Paying Living Expenses: Not hard at all  Food Insecurity: No Food Insecurity (02/08/2023)   Received from [facility]   Hunger Vital Sign    Worried About Running Out of Food in the Last Year: Never true    Ran Out of Food in the Last Year: Never true  Transportation Needs: No Transportation Needs (02/08/2023)   Received from [facility] - Transportation    In the past 12 months, has lack of transportation kept you from medical appointments or from getting medications?: No    Lack of Transportation (Non-Medical): No  Physical Activity: Not on file  Stress: No Stress Concern Present (01/25/2023)   [employer] [facility] [employer] - Occupational Stress Questionnaire    Feeling of Stress : Not at all  Social Connections: Not on file  Intimate Partner Violence: Not At Risk (01/25/2023)   Humiliation, Afraid, Rape, and Kick questionnaire    Fear of Current or Ex-Partner: No    Emotionally Abused: No    Physically Abused: No    Sexually Abused: No    FAMILY HISTORY: No family history on file.  ALLERGIES:  is allergic to nsaids and other.  MEDICATIONS:  Current Outpatient Medications  Medication Sig Dispense Refill   atorvastatin (LIPITOR) 20 MG tablet TAKE 1 TABLET BY MOUTH EVERY DAY AT NIGHT     dexamethasone (DECADRON) 4 MG tablet Take 2 tablets (8 mg) by mouth daily x 3 days starting the day after cisplatin chemotherapy. Take with food. 30 tablet 1   hydrochlorothiazide (HYDRODIURIL) 12.5 MG tablet Take 12.5 mg by mouth daily.     HYDROcodone-acetaminophen (NORCO/VICODIN) 5-325 MG tablet Take 1 tablet by mouth every 6 (six) hours as needed for moderate pain. 8 tablet 0    lidocaine-prilocaine (EMLA) cream Apply to affected area once 30 g 3   ondansetron (ZOFRAN) 8 MG tablet Take 1 tablet (8 mg total) by mouth every 8 (eight) hours as needed for nausea or vomiting. Start on the third day after cisplatin. 30 tablet 1   prochlorperazine (COMPAZINE) 10 MG tablet Take 1 tablet (10 mg total) by mouth every 6 (six) hours as needed (Nausea or vomiting). 30 tablet 1   phenazopyridine (PYRIDIUM) 200 MG tablet Take 1 tablet (200 mg total) by mouth 3 (three) times daily as needed (burning with urination). (Patient not taking: Reported on 01/02/2023) 15 tablet 0  No current facility-administered medications for this visit.    REVIEW OF SYSTEMS:   Pertinent information mentioned in HPI All other systems were reviewed with the patient and are negative.  PHYSICAL EXAMINATION: ECOG PERFORMANCE STATUS: 0 - Asymptomatic  Vitals:   03/06/23 0913 03/06/23 0914  BP: (!) 146/76 (!) 142/74  Pulse: 75   Temp: 97.6 F (36.4 C)   SpO2: 100%    Filed Weights   03/06/23 0913  Weight: 165 lb (74.8 kg)    GENERAL:alert, no distress and comfortable SKIN: skin color, texture, turgor are normal, no rashes or significant lesions EYES: normal, conjunctiva are pink and non-injected, sclera clear OROPHARYNX:no exudate, no erythema and lips, buccal mucosa, and tongue normal  NECK: supple, thyroid normal size, non-tender, without nodularity LYMPH:  no palpable lymphadenopathy in the cervical, axillary or inguinal LUNGS: clear to auscultation and percussion with normal breathing effort HEART: regular rate & rhythm and no murmurs and no lower extremity edema ABDOMEN:abdomen soft, non-tender and normal bowel sounds Musculoskeletal:no cyanosis of digits and no clubbing  PSYCH: alert & oriented x 3 with fluent speech NEURO: no focal motor/sensory deficits  LABORATORY DATA:  I have reviewed the data as listed Lab Results  Component Value Date   WBC 10.4 03/06/2023   HGB 12.2 (L)  03/06/2023   HCT 36.5 (L) 03/06/2023   MCV 95.8 03/06/2023   PLT 231 03/06/2023   Recent Labs    01/30/23 0821 02/03/23 0840 02/14/23 1035 03/06/23 0859  NA 132* 134* 136 139  K 3.5 3.3* 3.9 4.0  CL 101 100 100 103  CO2 23 26 25 27   GLUCOSE 111* 102* 103* 110*  BUN 55* 26* 31* 17  CREATININE 1.61* 1.48* 1.26* 1.12  CALCIUM 8.0* 8.4* 9.0 8.9  GFRNONAA 47* 52* >60 >60  PROT 6.4*  --  7.0 6.6  ALBUMIN 3.6  --  3.9 3.8  AST 23  --  23 19  ALT 28  --  22 16  ALKPHOS 75  --  90 74  BILITOT 0.7  --  0.3 0.6    RADIOGRAPHIC STUDIES: I have personally reviewed the radiological images as listed and agreed with the findings in the report. No results found.

## 2023-03-07 ENCOUNTER — Inpatient Hospital Stay: Payer: Medicare Other

## 2023-03-07 ENCOUNTER — Other Ambulatory Visit: Payer: Self-pay

## 2023-03-07 VITALS — BP 117/63 | HR 73 | Temp 95.3°F | Resp 18

## 2023-03-07 DIAGNOSIS — C679 Malignant neoplasm of bladder, unspecified: Secondary | ICD-10-CM

## 2023-03-07 DIAGNOSIS — Z5111 Encounter for antineoplastic chemotherapy: Secondary | ICD-10-CM | POA: Diagnosis not present

## 2023-03-07 MED ORDER — SODIUM CHLORIDE 0.9 % IV SOLN
150.0000 mg | Freq: Once | INTRAVENOUS | Status: AC
  Administered 2023-03-07: 150 mg via INTRAVENOUS
  Filled 2023-03-07: qty 150

## 2023-03-07 MED ORDER — PALONOSETRON HCL INJECTION 0.25 MG/5ML
0.2500 mg | Freq: Once | INTRAVENOUS | Status: AC
  Administered 2023-03-07: 0.25 mg via INTRAVENOUS
  Filled 2023-03-07: qty 5

## 2023-03-07 MED ORDER — POTASSIUM CHLORIDE IN NACL 20-0.9 MEQ/L-% IV SOLN
Freq: Once | INTRAVENOUS | Status: AC
  Filled 2023-03-07: qty 1000

## 2023-03-07 MED ORDER — MAGNESIUM SULFATE 2 GM/50ML IV SOLN
2.0000 g | Freq: Once | INTRAVENOUS | Status: AC
  Administered 2023-03-07: 2 g via INTRAVENOUS
  Filled 2023-03-07: qty 50

## 2023-03-07 MED ORDER — SODIUM CHLORIDE 0.9 % IV SOLN
10.0000 mg | Freq: Once | INTRAVENOUS | Status: AC
  Administered 2023-03-07: 10 mg via INTRAVENOUS
  Filled 2023-03-07: qty 10

## 2023-03-07 MED ORDER — SODIUM CHLORIDE 0.9 % IV SOLN
Freq: Once | INTRAVENOUS | Status: AC
  Filled 2023-03-07: qty 250

## 2023-03-07 MED ORDER — SODIUM CHLORIDE 0.9 % IV SOLN
30.0000 mg/m2 | Freq: Once | INTRAVENOUS | Status: AC
  Administered 2023-03-07: 58 mg via INTRAVENOUS
  Filled 2023-03-07: qty 58

## 2023-03-07 MED ORDER — METHOTREXATE SODIUM CHEMO INJECTION (PF) 50 MG/2ML
30.0000 mg/m2 | Freq: Once | INTRAMUSCULAR | Status: AC
  Administered 2023-03-07: 58 mg via INTRAVENOUS
  Filled 2023-03-07: qty 2.32

## 2023-03-07 MED ORDER — HEPARIN SOD (PORK) LOCK FLUSH 100 UNIT/ML IV SOLN
500.0000 [IU] | Freq: Once | INTRAVENOUS | Status: AC | PRN
  Administered 2023-03-07: 500 [IU]
  Filled 2023-03-07: qty 5

## 2023-03-07 NOTE — Patient Instructions (Signed)
[facility]  Discharge Instructions: Thank you for choosing [facility] to provide your oncology and hematology care.  If you have a lab appointment with the Cancer Center, please go directly to the Cancer Center and check in at the registration area.  Wear comfortable clothing and clothing appropriate for easy access to any Portacath or PICC line.   We strive to give you quality time with your provider. You may need to reschedule your appointment if you arrive late (15 or more minutes).  Arriving late affects you and other patients whose appointments are after yours.  Also, if you miss three or more appointments without notifying the office, you may be dismissed from the clinic at the provider's discretion.      For prescription refill requests, have your pharmacy contact our office and allow 72 hours for refills to be completed.    Today you received the following chemotherapy and/or immunotherapy agents methotrexate and cisplatin      To help prevent nausea and vomiting after your treatment, we encourage you to take your nausea medication as directed.  BELOW ARE SYMPTOMS THAT SHOULD BE REPORTED IMMEDIATELY: *FEVER GREATER THAN 100.4 F (38 C) OR HIGHER *CHILLS OR SWEATING *NAUSEA AND VOMITING THAT IS NOT CONTROLLED WITH YOUR NAUSEA MEDICATION *UNUSUAL SHORTNESS OF BREATH *UNUSUAL BRUISING OR BLEEDING *URINARY PROBLEMS (pain or burning when urinating, or frequent urination) *BOWEL PROBLEMS (unusual diarrhea, constipation, pain near the anus) TENDERNESS IN MOUTH AND THROAT WITH OR WITHOUT PRESENCE OF ULCERS (sore throat, sores in mouth, or a toothache) UNUSUAL RASH, SWELLING OR PAIN  UNUSUAL VAGINAL DISCHARGE OR ITCHING   Items with * indicate a potential emergency and should be followed up as soon as possible or go to the Emergency Department if any problems should occur.  Please show the CHEMOTHERAPY ALERT CARD or IMMUNOTHERAPY ALERT  CARD at check-in to the Emergency Department and triage nurse.  Should you have questions after your visit or need to cancel or reschedule your appointment, please contact [facility]  [phone] and follow the prompts.  Office hours are 8:00 a.m. to 4:30 p.m. Monday - Friday. Please note that voicemails left after 4:00 p.m. may not be returned until the following business day.  We are closed weekends and major holidays. You have access to a nurse at all times for urgent questions. Please call the main number to the clinic [phone] and follow the prompts.  For any non-urgent questions, you may also contact your provider using MyChart. We now offer e-Visits for anyone 49 and older to request care online for non-urgent symptoms. For details visit [email or web address].   Also download the MyChart app! Go to the app store, search "MyChart", open the app, select [place], and log in with your MyChart username and password.

## 2023-03-07 NOTE — Progress Notes (Signed)
Nutrition Follow-up:  Patient with bladder cancer.  Patient receiving split dose cisplatin due to ototoxicity.  Met with patient during infusion.  Reports good appetite.  Denies any nutrition impact symptoms.  Continues to eat mostly 2 meals a day (lunch and dinner).  Drinks ensure shakes at times    Medications: reviewed  Labs: reviewed  Anthropometrics:   Weight 165 lb today 160 lb 8 oz on 8/12 on 8/13 161-167 lb UBW   NUTRITION DIAGNOSIS: none at this time    INTERVENTION:  Continue balanced diet including good sources of lean protein.     MONITORING, EVALUATION, GOAL: weight trends, intake   NEXT VISIT: as needed  Ryan Dixon, RD, LDN Registered Dietitian [phone]

## 2023-03-08 ENCOUNTER — Encounter: Payer: Self-pay | Admitting: Internal Medicine

## 2023-03-08 ENCOUNTER — Inpatient Hospital Stay: Payer: Medicare Other

## 2023-03-08 VITALS — BP 132/85 | HR 86 | Temp 96.3°F | Resp 18

## 2023-03-08 DIAGNOSIS — C679 Malignant neoplasm of bladder, unspecified: Secondary | ICD-10-CM

## 2023-03-08 DIAGNOSIS — Z5111 Encounter for antineoplastic chemotherapy: Secondary | ICD-10-CM | POA: Diagnosis not present

## 2023-03-08 MED ORDER — SODIUM CHLORIDE 0.9 % IV SOLN
150.0000 mg | Freq: Once | INTRAVENOUS | Status: DC
  Filled 2023-03-08: qty 5

## 2023-03-08 MED ORDER — PALONOSETRON HCL INJECTION 0.25 MG/5ML
0.2500 mg | Freq: Once | INTRAVENOUS | Status: DC
  Filled 2023-03-08: qty 5

## 2023-03-08 MED ORDER — VINBLASTINE SULFATE CHEMO INJECTION 1 MG/ML
3.0000 mg/m2 | Freq: Once | INTRAVENOUS | Status: AC
  Administered 2023-03-08: 5.8 mg via INTRAVENOUS
  Filled 2023-03-08: qty 5.8

## 2023-03-08 MED ORDER — SODIUM CHLORIDE 0.9 % IV SOLN
Freq: Once | INTRAVENOUS | Status: AC
  Filled 2023-03-08: qty 250

## 2023-03-08 MED ORDER — MAGNESIUM SULFATE 2 GM/50ML IV SOLN
2.0000 g | Freq: Once | INTRAVENOUS | Status: AC
  Administered 2023-03-08: 2 g via INTRAVENOUS
  Filled 2023-03-08: qty 50

## 2023-03-08 MED ORDER — SODIUM CHLORIDE 0.9 % IV SOLN
10.0000 mg | Freq: Once | INTRAVENOUS | Status: AC
  Administered 2023-03-08: 10 mg via INTRAVENOUS
  Filled 2023-03-08: qty 10

## 2023-03-08 MED ORDER — DOXORUBICIN HCL CHEMO IV INJECTION 2 MG/ML
30.0000 mg/m2 | Freq: Once | INTRAVENOUS | Status: AC
  Administered 2023-03-08: 58 mg via INTRAVENOUS
  Filled 2023-03-08: qty 29

## 2023-03-08 MED ORDER — HEPARIN SOD (PORK) LOCK FLUSH 100 UNIT/ML IV SOLN
500.0000 [IU] | Freq: Once | INTRAVENOUS | Status: AC | PRN
  Administered 2023-03-08: 500 [IU]
  Filled 2023-03-08: qty 5

## 2023-03-08 MED ORDER — POTASSIUM CHLORIDE IN NACL 20-0.9 MEQ/L-% IV SOLN
Freq: Once | INTRAVENOUS | Status: AC
  Filled 2023-03-08: qty 1000

## 2023-03-08 MED ORDER — SODIUM CHLORIDE 0.9 % IV SOLN
30.0000 mg/m2 | Freq: Once | INTRAVENOUS | Status: AC
  Administered 2023-03-08: 58 mg via INTRAVENOUS
  Filled 2023-03-08: qty 58

## 2023-03-08 NOTE — Patient Instructions (Signed)
[facility]  Discharge Instructions: Thank you for choosing [facility] to provide your oncology and hematology care.  If you have a lab appointment with the Cancer Center, please go directly to the Cancer Center and check in at the registration area.  Wear comfortable clothing and clothing appropriate for easy access to any Portacath or PICC line.   We strive to give you quality time with your provider. You may need to reschedule your appointment if you arrive late (15 or more minutes).  Arriving late affects you and other patients whose appointments are after yours.  Also, if you miss three or more appointments without notifying the office, you may be dismissed from the clinic at the provider's discretion.      For prescription refill requests, have your pharmacy contact our office and allow 72 hours for refills to be completed.    Today you received the following chemotherapy and/or immunotherapy agents ADRIAMYCIN, VELBAN, CISPLATIN      To help prevent nausea and vomiting after your treatment, we encourage you to take your nausea medication as directed.  BELOW ARE SYMPTOMS THAT SHOULD BE REPORTED IMMEDIATELY: *FEVER GREATER THAN 100.4 F (38 C) OR HIGHER *CHILLS OR SWEATING *NAUSEA AND VOMITING THAT IS NOT CONTROLLED WITH YOUR NAUSEA MEDICATION *UNUSUAL SHORTNESS OF BREATH *UNUSUAL BRUISING OR BLEEDING *URINARY PROBLEMS (pain or burning when urinating, or frequent urination) *BOWEL PROBLEMS (unusual diarrhea, constipation, pain near the anus) TENDERNESS IN MOUTH AND THROAT WITH OR WITHOUT PRESENCE OF ULCERS (sore throat, sores in mouth, or a toothache) UNUSUAL RASH, SWELLING OR PAIN  UNUSUAL VAGINAL DISCHARGE OR ITCHING   Items with * indicate a potential emergency and should be followed up as soon as possible or go to the Emergency Department if any problems should occur.  Please show the CHEMOTHERAPY ALERT CARD or IMMUNOTHERAPY ALERT  CARD at check-in to the Emergency Department and triage nurse.  Should you have questions after your visit or need to cancel or reschedule your appointment, please contact [facility]  [phone] and follow the prompts.  Office hours are 8:00 a.m. to 4:30 p.m. Monday - Friday. Please note that voicemails left after 4:00 p.m. may not be returned until the following business day.  We are closed weekends and major holidays. You have access to a nurse at all times for urgent questions. Please call the main number to the clinic [phone] and follow the prompts.  For any non-urgent questions, you may also contact your provider using MyChart. We now offer e-Visits for anyone 52 and older to request care online for non-urgent symptoms. For details visit [email or web address].   Also download the MyChart app! Go to the app store, search "MyChart", open the app, select [place], and log in with your MyChart username and password.   Doxorubicin Injection What is this medication? DOXORUBICIN (dox oh ROO bi sin) treats some types of cancer. It works by slowing down the growth of cancer cells. This medicine may be used for other purposes; ask your health care provider or pharmacist if you have questions. COMMON BRAND NAME(S): Adriamycin, Adriamycin PFS, Adriamycin RDF, Rubex What should I tell my care team before I take this medication? They need to know if you have any of these conditions: Heart disease History of low blood cell levels caused by a medication Liver disease Recent or ongoing radiation An unusual or allergic reaction to doxorubicin, other medications, foods, dyes, or preservatives If you  or your partner are pregnant or trying to get pregnant Breast-feeding How should I use this medication? This medication is injected into a vein. It is given by your care team in a hospital or clinic setting. Talk to your care team about the use of this medication  in children. Special care may be needed. Overdosage: If you think you have taken too much of this medicine contact a poison control center or emergency room at once. NOTE: This medicine is only for you. Do not share this medicine with others. What if I miss a dose? Keep appointments for follow-up doses. It is important not to miss your dose. Call your care team if you are unable to keep an appointment. What may interact with this medication? 6-mercaptopurine Paclitaxel Phenytoin St. John's wort Trastuzumab Verapamil This list may not describe all possible interactions. Give your health care provider a list of all the medicines, herbs, non-prescription drugs, or dietary supplements you use. Also tell them if you smoke, drink alcohol, or use illegal drugs. Some items may interact with your medicine. What should I watch for while using this medication? Your condition will be monitored carefully while you are receiving this medication. You may need blood work while taking this medication. This medication may make you feel generally unwell. This is not uncommon as chemotherapy can affect healthy cells as well as cancer cells. Report any side effects. Continue your course of treatment even though you feel ill unless your care team tells you to stop. There is a maximum amount of this medication you should receive throughout your life. The amount depends on the medical condition being treated and your overall health. Your care team will watch how much of this medication you receive. Tell your care team if you have taken this medication before. Your urine may turn red for a few days after your dose. This is not blood. If your urine is dark or brown, call your care team. In some cases, you may be given additional medications to help with side effects. Follow all directions for their use. This medication may increase your risk of getting an infection. Call your care team for advice if you get a fever,  chills, sore throat, or other symptoms of a cold or flu. Do not treat yourself. Try to avoid being around people who are sick. This medication may increase your risk to bruise or bleed. Call your care team if you notice any unusual bleeding. Talk to your care team about your risk of cancer. You may be more at risk for certain types of cancers if you take this medication. Talk to your care team if you or your partner may be pregnant. Serious birth defects can occur if you take this medication during pregnancy and for 6 months after the last dose. Contraception is recommended while taking this medication and for 6 months after the last dose. Your care team can help you find the option that works for you. If your partner can get pregnant, use a condom while taking this medication and for 6 months after the last dose. Do not breastfeed while taking this medication. This medication may cause infertility. Talk to your care team if you are concerned about your fertility. What side effects may I notice from receiving this medication? Side effects that you should report to your care team as soon as possible: Allergic reactions--skin rash, itching, hives, swelling of the face, lips, tongue, or throat Heart failure--shortness of breath, swelling of the ankles, feet, or  hands, sudden weight gain, unusual weakness or fatigue Heart rhythm changes--fast or irregular heartbeat, dizziness, feeling faint or lightheaded, chest pain, trouble breathing Infection--fever, chills, cough, sore throat, wounds that don't heal, pain or trouble when passing urine, general feeling of discomfort or being unwell Low red blood cell level--unusual weakness or fatigue, dizziness, headache, trouble breathing Painful swelling, warmth, or redness of the skin, blisters or sores at the infusion site Unusual bruising or bleeding Side effects that usually do not require medical attention (report to your care team if they continue or are  bothersome): Diarrhea Hair loss Nausea Pain, redness, or swelling with sores inside the mouth or throat Red urine This list may not describe all possible side effects. Call your doctor for medical advice about side effects. You may report side effects to FDA at 1-800-FDA-1088. Where should I keep my medication? This medication is given in a hospital or clinic. It will not be stored at home. NOTE: This sheet is a summary. It may not cover all possible information. If you have questions about this medicine, talk to your doctor, pharmacist, or health care provider.  2024 Elsevier/Gold Standard (2022-09-01 00:00:00)  Vinblastine Injection What is this medication? VINBLASTINE (vin BLAS teen) treats some types of cancer. It works by slowing down the growth of cancer cells. This medicine may be used for other purposes; ask your health care provider or pharmacist if you have questions. COMMON BRAND NAME(S): Velban What should I tell my care team before I take this medication? They need to know if you have any of these conditions: Heart disease Infection Liver disease Low white blood cell levels Lung disease An unusual or allergic reaction to vinblastine, other chemotherapy agents, other medications, foods, dyes, or preservatives Pregnant or trying to get pregnant Breast-feeding How should I use this medication? This medication is injected into a vein. It is given by your care team in a hospital or clinic setting. Talk to your care team about the use of this medication in children. While it may be given to children for selected conditions, precautions do apply. Overdosage: If you think you have taken too much of this medicine contact a poison control center or emergency room at once. NOTE: This medicine is only for you. Do not share this medicine with others. What if I miss a dose? Keep appointments for follow-up doses. It is important not to miss your dose. Call your care team if you are  unable to keep an appointment. What may interact with this medication? Erythromycin Phenytoin This medication may affect how other medications work, and other medications may affect the way this medication works. Talk with your care team about all of the medications you take. They may suggest changes to your treatment plan to lower the risk of side effects and to make sure your medications work as intended. This list may not describe all possible interactions. Give your health care provider a list of all the medicines, herbs, non-prescription drugs, or dietary supplements you use. Also tell them if you smoke, drink alcohol, or use illegal drugs. Some items may interact with your medicine. What should I watch for while using this medication? Your condition will be monitored carefully while you are receiving this medication. This medication may make you feel generally unwell. This is not uncommon as chemotherapy can affect healthy cells as well as cancer cells. Report any side effects. Continue your course of treatment even though you feel ill unless your care team tells you to stop. You  may need blood work while taking this medication. This medication will cause constipation. If you do not have a bowel movement for 3 days, call your care team. This medication may increase your risk to bruise or bleed. Call your care team if you notice any unusual bleeding. This medication may increase your risk of getting an infection. Call your care team for advice if you get a fever, chills, sore throat, or other symptoms of a cold or flu. Do not treat yourself. Try to avoid being around people who are sick. Be careful brushing or flossing your teeth or using a toothpick because you may get an infection or bleed more easily. If you have any dental work done, tell your dentist you are receiving this medication. Talk to your care team if you or your partner wish to become pregnant or think either of you might be  pregnant. This medication can cause serious birth defects. This medication may cause infertility. Talk to your care team if you are concerned about your fertility. Talk to your care team before breastfeeding. Changes to your treatment plan may be needed. What side effects may I notice from receiving this medication? Side effects that you should report to your care team as soon as possible: Allergic reactions--skin rash, itching, hives, swelling of the face, lips, tongue, or throat Infection--fever, chills, cough, sore throat, wounds that don't heal, pain or trouble when passing urine, general feeling of discomfort or being unwell Painful swelling, warmth, or redness of the skin, blisters or sores at the infusion site Shortness of breath or trouble breathing Unusual bruising or bleeding Side effects that usually do not require medical attention (report to your care team if they continue or are bothersome): Bone pain Constipation Hair loss Nausea Stomach pain Vomiting This list may not describe all possible side effects. Call your doctor for medical advice about side effects. You may report side effects to FDA at 1-800-FDA-1088. Where should I keep my medication? This medication is given in a hospital or clinic. It will not be stored at home. NOTE: This sheet is a summary. It may not cover all possible information. If you have questions about this medicine, talk to your doctor, pharmacist, or health care provider.  2024 Elsevier/Gold Standard (2021-08-24 00:00:00)  Cisplatin Injection What is this medication? CISPLATIN (SIS pla tin) treats some types of cancer. It works by slowing down the growth of cancer cells. This medicine may be used for other purposes; ask your health care provider or pharmacist if you have questions. COMMON BRAND NAME(S): Platinol, Platinol -AQ What should I tell my care team before I take this medication? They need to know if you have any of these conditions: Eye  disease, vision problems Hearing problems Kidney disease Low blood counts, such as low white cells, platelets, or red blood cells Tingling of the fingers or toes, or other nerve disorder An unusual or allergic reaction to cisplatin, carboplatin, oxaliplatin, other medications, foods, dyes, or preservatives If you or your partner are pregnant or trying to get pregnant Breast-feeding How should I use this medication? This medication is injected into a vein. It is given by your care team in a hospital or clinic setting. Talk to your care team about the use of this medication in children. Special care may be needed. Overdosage: If you think you have taken too much of this medicine contact a poison control center or emergency room at once. NOTE: This medicine is only for you. Do not share this medicine  with others. What if I miss a dose? Keep appointments for follow-up doses. It is important not to miss your dose. Call your care team if you are unable to keep an appointment. What may interact with this medication? Do not take this medication with any of the following: Live virus vaccines This medication may also interact with the following: Certain antibiotics, such as amikacin, gentamicin, neomycin, polymyxin B, streptomycin, tobramycin, vancomycin Foscarnet This list may not describe all possible interactions. Give your health care provider a list of all the medicines, herbs, non-prescription drugs, or dietary supplements you use. Also tell them if you smoke, drink alcohol, or use illegal drugs. Some items may interact with your medicine. What should I watch for while using this medication? Your condition will be monitored carefully while you are receiving this medication. You may need blood work done while taking this medication. This medication may make you feel generally unwell. This is not uncommon, as chemotherapy can affect healthy cells as well as cancer cells. Report any side effects.  Continue your course of treatment even though you feel ill unless your care team tells you to stop. This medication may increase your risk of getting an infection. Call your care team for advice if you get a fever, chills, sore throat, or other symptoms of a cold or flu. Do not treat yourself. Try to avoid being around people who are sick. Avoid taking medications that contain aspirin, acetaminophen, ibuprofen, naproxen, or ketoprofen unless instructed by your care team. These medications may hide a fever. This medication may increase your risk to bruise or bleed. Call your care team if you notice any unusual bleeding. Be careful brushing or flossing your teeth or using a toothpick because you may get an infection or bleed more easily. If you have any dental work done, tell your dentist you are receiving this medication. Drink fluids as directed while you are taking this medication. This will help protect your kidneys. Call your care team if you get diarrhea. Do not treat yourself. Talk to your care team if you or your partner wish to become pregnant or think you might be pregnant. This medication can cause serious birth defects if taken during pregnancy and for 14 months after the last dose. A negative pregnancy test is required before starting this medication. A reliable form of contraception is recommended while taking this medication and for 14 months after the last dose. Talk to your care team about effective forms of contraception. Do not father a child while taking this medication and for 11 months after the last dose. Use a condom during sex during this time period. Do not breast-feed while taking this medication. This medication may cause infertility. Talk to your care team if you are concerned about your fertility. What side effects may I notice from receiving this medication? Side effects that you should report to your care team as soon as possible: Allergic reactions--skin rash, itching,  hives, swelling of the face, lips, tongue, or throat Eye pain, change in vision, vision loss Hearing loss, ringing in ears Infection--fever, chills, cough, sore throat, wounds that don't heal, pain or trouble when passing urine, general feeling of discomfort or being unwell Kidney injury--decrease in the amount of urine, swelling of the ankles, hands, or feet Low red blood cell level--unusual weakness or fatigue, dizziness, headache, trouble breathing Painful swelling, warmth, or redness of the skin, blisters or sores at the infusion site Pain, tingling, or numbness in the hands or feet Unusual bruising  or bleeding Side effects that usually do not require medical attention (report to your care team if they continue or are bothersome): Hair loss Nausea Vomiting This list may not describe all possible side effects. Call your doctor for medical advice about side effects. You may report side effects to FDA at 1-800-FDA-1088. Where should I keep my medication? This medication is given in a hospital or clinic. It will not be stored at home. NOTE: This sheet is a summary. It may not cover all possible information. If you have questions about this medicine, talk to your doctor, pharmacist, or health care provider.  2024 Elsevier/Gold Standard (2021-10-01 00:00:00)

## 2023-03-08 NOTE — Progress Notes (Signed)
Ok to omit Aloxi & Emend on Day 2 since these were given w/ Day 1 premeds per Dr. Alena Bills.  Ebony Hail, Pharm.D., CPP 03/08/2023@11 :01 AM

## 2023-03-09 ENCOUNTER — Inpatient Hospital Stay: Payer: Medicare Other

## 2023-03-09 DIAGNOSIS — Z5111 Encounter for antineoplastic chemotherapy: Secondary | ICD-10-CM | POA: Diagnosis not present

## 2023-03-09 DIAGNOSIS — C679 Malignant neoplasm of bladder, unspecified: Secondary | ICD-10-CM

## 2023-03-09 MED ORDER — PEGFILGRASTIM-FPGK 6 MG/0.6ML ~~LOC~~ SOSY
6.0000 mg | PREFILLED_SYRINGE | Freq: Once | SUBCUTANEOUS | Status: AC
  Administered 2023-03-09: 6 mg via SUBCUTANEOUS
  Filled 2023-03-09: qty 0.6

## 2023-03-13 ENCOUNTER — Inpatient Hospital Stay (HOSPITAL_BASED_OUTPATIENT_CLINIC_OR_DEPARTMENT_OTHER): Payer: Medicare Other | Admitting: Internal Medicine

## 2023-03-13 ENCOUNTER — Inpatient Hospital Stay: Payer: Medicare Other

## 2023-03-13 VITALS — BP 119/73 | HR 88 | Temp 97.1°F | Wt 159.0 lb

## 2023-03-13 DIAGNOSIS — E860 Dehydration: Secondary | ICD-10-CM

## 2023-03-13 DIAGNOSIS — C679 Malignant neoplasm of bladder, unspecified: Secondary | ICD-10-CM

## 2023-03-13 DIAGNOSIS — H938X3 Other specified disorders of ear, bilateral: Secondary | ICD-10-CM | POA: Diagnosis not present

## 2023-03-13 DIAGNOSIS — Z5111 Encounter for antineoplastic chemotherapy: Secondary | ICD-10-CM | POA: Diagnosis not present

## 2023-03-13 DIAGNOSIS — T451X5A Adverse effect of antineoplastic and immunosuppressive drugs, initial encounter: Secondary | ICD-10-CM

## 2023-03-13 DIAGNOSIS — N144 Toxic nephropathy, not elsewhere classified: Secondary | ICD-10-CM | POA: Diagnosis not present

## 2023-03-13 LAB — CMP (CANCER CENTER ONLY)
ALT: 24 U/L (ref 0–44)
AST: 18 U/L (ref 15–41)
Albumin: 4 g/dL (ref 3.5–5.0)
Alkaline Phosphatase: 108 U/L (ref 38–126)
Anion gap: 9 (ref 5–15)
BUN: 29 mg/dL — ABNORMAL HIGH (ref 8–23)
CO2: 26 mmol/L (ref 22–32)
Calcium: 9.2 mg/dL (ref 8.9–10.3)
Chloride: 98 mmol/L (ref 98–111)
Creatinine: 1.26 mg/dL — ABNORMAL HIGH (ref 0.61–1.24)
GFR, Estimated: >60 mL/min (ref >60)
Glucose, Bld: 110 mg/dL — ABNORMAL HIGH (ref 70–99)
Potassium: 4.1 mmol/L (ref 3.5–5.1)
Sodium: 133 mmol/L — ABNORMAL LOW (ref 135–145)
Total Bilirubin: 0.9 mg/dL (ref 0.3–1.2)
Total Protein: 6.9 g/dL (ref 6.5–8.1)

## 2023-03-13 LAB — CBC WITH DIFFERENTIAL (CANCER CENTER ONLY)
Abs Immature Granulocytes: 0.58 10*3/uL — ABNORMAL HIGH (ref 0.00–0.07)
Basophils Absolute: 0.1 10*3/uL (ref 0.0–0.1)
Basophils Relative: 1 %
Eosinophils Absolute: 0.3 10*3/uL (ref 0.0–0.5)
Eosinophils Relative: 2 %
HCT: 37.5 % — ABNORMAL LOW (ref 39.0–52.0)
Hemoglobin: 12.7 g/dL — ABNORMAL LOW (ref 13.0–17.0)
Immature Granulocytes: 5 %
Lymphocytes Relative: 11 %
Lymphs Abs: 1.1 10*3/uL (ref 0.7–4.0)
MCH: 32.5 pg (ref 26.0–34.0)
MCHC: 33.9 g/dL (ref 30.0–36.0)
MCV: 95.9 fL (ref 80.0–100.0)
Monocytes Absolute: 0.1 10*3/uL (ref 0.1–1.0)
Monocytes Relative: 1 %
Neutro Abs: 8.6 10*3/uL — ABNORMAL HIGH (ref 1.7–7.7)
Neutrophils Relative %: 80 %
Platelet Count: 78 10*3/uL — ABNORMAL LOW (ref 150–400)
RBC: 3.91 MIL/uL — ABNORMAL LOW (ref 4.22–5.81)
RDW: 13.2 % (ref 11.5–15.5)
Smear Review: NORMAL
WBC Count: 10.8 10*3/uL — ABNORMAL HIGH (ref 4.0–10.5)
nRBC: 0 % (ref 0.0–0.2)

## 2023-03-13 LAB — MAGNESIUM: Magnesium: 1.9 mg/dL (ref 1.7–2.4)

## 2023-03-13 MED ORDER — SODIUM CHLORIDE 0.9% FLUSH
10.0000 mL | Freq: Once | INTRAVENOUS | Status: AC
  Administered 2023-03-13: 10 mL via INTRAVENOUS
  Filled 2023-03-13: qty 10

## 2023-03-13 MED ORDER — HEPARIN SOD (PORK) LOCK FLUSH 100 UNIT/ML IV SOLN
500.0000 [IU] | Freq: Once | INTRAVENOUS | Status: AC
  Administered 2023-03-13: 500 [IU] via INTRAVENOUS
  Filled 2023-03-13: qty 5

## 2023-03-13 MED ORDER — SODIUM CHLORIDE 0.9 % IV SOLN
INTRAVENOUS | Status: DC
  Filled 2023-03-13 (×2): qty 250

## 2023-03-13 NOTE — Progress Notes (Signed)
[facility] CONSULT NOTE  Patient Care Team: Marisue Ivan, MD as PCP - General (Family Medicine) Michaelyn Barter, MD as Consulting Physician (Oncology)   CANCER STAGING   Cancer Staging  Bladder cancer [facility]) Staging form: Urinary Bladder, AJCC 8th Edition - Clinical stage from 12/07/2022: Stage II (cT2, cN0, cM0) - Signed by Michaelyn Barter, MD on 01/02/2023 Stage prefix: Initial diagnosis WHO/ISUP grade (low/high): High Grade Histologic grading system: 2 grade system   ASSESSMENT & PLAN:  Ryan Dixon 67 y.o. male with pmh of HTN, HLD, current smoker referred to Medical Oncology for management of bladder cancer.   # Bladder cancer, high grade, cT2 N0 M0 -Was seen by Dr. Lonna Cobb for intermittent hematuria.  Underwent cystoscopy with biopsy of mass close to right ureteral orifice, 2 masses from the bladder neck.  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  -Repeat CT chest abdomen pelvis from 01/06/2023 showed nodular wall thickening along the posterior urinary bladder consistent with known bladder cancer.  No evidence of metastatic disease in the chest abdomen pelvis.  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  - Echo showed normal EF of 55 to 60%.  -Started cycle 1 ddMVAC on 01/23/2023.  Within 1 week of initiation, patient developed bilateral constant tinnitus and decreased hearing with high-pitched sound.  Also developed nephrotoxicity with creatinine peaking at 1.6.  Supported with IV fluids. Evaluated by Dr. Jenne Campus, ENT.  Diagnosed with sensorineural hearing loss and was advised hearing aid.  Chemotherapy was placed on hold.  Patient reports some improvement in his tinnitus.  He likely has some baseline hearing issues which was exacerbated by platinum chemotherapy.  ENT was okay to continue with chemotherapy.   -Risk and benefit of continuation of platinum based chemo with  worsening ototoxicity was discussed with the patient.  Patient understands and would like to proceed.   -Received cycle 2 DD MVAC with split dose cisplatin 30 mg/m on day 1 and day 2.  Denies any changes with his hearing or ringing sensation.  Creatinine today is 1.26.  Will proceed with 1 L IV normal saline.  He will follow-up in 1 week for cycle 3 of DD MVAC.  # Platinum induced nephrotoxicity -Split dose cisplatin.  Support with IV's.  # Platinum induced ototoxicity -Manifested within 1 week of cycle 1 ddMVAC.  -Seen by Dr. Jenne Campus, ENT.  Likely had baseline hearing issues.  Diagnosed with sensorineural hearing loss.  Hearing aid was advised. -on split dose cisplatin with mild dose reduction.  Close monitoring.  Risk and benefit of the chemo was discussed with the patient and he like to proceed.  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   No orders of the defined types were placed in this encounter.  RTC in 2 weeks for MD visit, labs, cycle 3 dose dense MVAC  The total time spent in the appointment was 25 minutes encounter with patients including review of chart and various tests results, discussions about plan of care and coordination of care plan   All questions were answered. The patient knows to call the clinic with any problems, questions or concerns. No barriers to learning was detected.  Michaelyn Barter, MD 9/30/20241:33 PM   HISTORY OF PRESENTING ILLNESS:  Ryan Dixon 67 y.o. male with pmh of HTN, HLD,  current smoker referred to Medical Oncology for management of bladder cancer.   Interval history Patient was seen today as follow-up prior to cycle 2 of dose dense MVAC. Reports mild improvement in tinnitus.  Has not scheduled appointment for hearing aid.  Otherwise no concerns.  I have reviewed his chart and  materials related to his cancer extensively and collaborated history with the patient. Summary of oncologic history is as follows: Oncology History  Bladder cancer (HCC)  10/25/2022 Initial Diagnosis   Seen by Dr. Lonna Cobb for intermittent hematuria since February 2024 along with other urinary symptoms.   10/28/2022 Imaging   CT hematuria workup Showed 2.2 cm bladder mass worrisome for cancer.  Adjacent mild diffuse bladder wall thickening.  Based on the location this may involve the extreme distal aspect of the right UVJ.  Extensive atherosclerotic changes with the occlusion of the right common iliac artery and areas of significant stenosis along the left. Please correlate with any symptoms such as claudication and further workup as clinically appropriate.  CT chest with contrast IMPRESSION: 1. Lung-RADS 2, benign appearance or behavior. Continue annual screening with low-dose chest CT without contrast in 12 months. 2. Similar ascending aortic dilatation at 4.2 cm. Recommend attention on follow-up lung cancer screening CT in 1 year.   11/29/2022 Procedure   Intraoperative findings:  Cystoscopy-urethra normal in caliber without stricture.  Prostate with mild lateral lobe enlargement and moderate bladder neck elevation.  UOs normal-appearing bilaterally.  The right UO was anterior to the bladder tumor Bladder tumor: 3 cm papillary/nodular tumor superior to right UO and extending laterally; ~ 1 cm papillary tumor anterior bladder neck 10:00; 2-1 cm papillary tumors anterior bladder neck 2:00    11/29/2022 Pathology Results        12/07/2022 Cancer Staging   Staging form: Urinary Bladder, AJCC 8th Edition - Clinical stage from 12/07/2022: Stage II (cT2, cN0, cM0) - Signed by Michaelyn Barter, MD on 01/02/2023 Stage prefix: Initial diagnosis WHO/ISUP grade (low/high): High Grade Histologic grading system: 2 grade system   01/02/2023 Initial Diagnosis   Bladder cancer (HCC)   01/23/2023 -   Chemotherapy   Patient is on Treatment Plan : BLADDER DOSE DENSE MVAC q14d       MEDICAL HISTORY:  Past Medical History:  Diagnosis Date   Aortic atherosclerosis (HCC)    Chronic foot pain, left    History of hematuria    Hypertension    Other emphysema (HCC)    Other male erectile dysfunction    Pre-diabetes    Pure hypercholesterolemia    Tobacco dependence     SURGICAL HISTORY: Past Surgical History:  Procedure Laterality Date   ANKLE ARTHROSCOPY Left 08/11/2020   Procedure: LEFT ANKLE ARTHROSCOPY AND DEBRIDEMENT;  Surgeon: Nadara Mustard, MD;  Location: [facility];  Service: Orthopedics;  Laterality: Left;   BLADDER INSTILLATION N/A 11/29/2022   Procedure: BLADDER INSTILLATION OF GEMCITABINE;  Surgeon: [name], MD;  Location: [facility];  Service: Urology;  Laterality: N/A;   COLONOSCOPY     IR IMAGING GUIDED PORT INSERTION  01/20/2023   TONSILLECTOMY     TRANSURETHRAL RESECTION OF BLADDER TUMOR N/A 11/29/2022   Procedure: TRANSURETHRAL RESECTION OF BLADDER TUMOR (TURBT);  Surgeon: [name], MD;  Location: [facility];  Service: Urology;  Laterality: N/A;    SOCIAL HISTORY: Social History   Socioeconomic History   Marital status: Married    Spouse name: Albin Felling   Number of children: Not on file  Years of education: Not on file   Highest education level: Not on file  Occupational History   Not on file  Tobacco Use   Smoking status: Every Day    Current packs/day: 2.00    Average packs/day: 2.0 packs/day for 43.0 years (86.0 ttl pk-yrs)    Types: Cigarettes   Smokeless tobacco: Never  Substance and Sexual Activity   Alcohol use: Yes    Comment: 1-2 beers/day   Drug use: Never   Sexual activity: Not on file  Other Topics Concern   Not on file  Social History Narrative   Not on file   Social Determinants of Health   Financial Resource Strain: Low Risk  (02/08/2023)   Received from [facility]   Overall Financial  Resource Strain (CARDIA)    Difficulty of Paying Living Expenses: Not hard at all  Food Insecurity: No Food Insecurity (02/08/2023)   Received from [facility]   Hunger Vital Sign    Worried About Running Out of Food in the Last Year: Never true    Ran Out of Food in the Last Year: Never true  Transportation Needs: No Transportation Needs (02/08/2023)   Received from [facility] - Transportation    In the past 12 months, has lack of transportation kept you from medical appointments or from getting medications?: No    Lack of Transportation (Non-Medical): No  Physical Activity: Not on file  Stress: No Stress Concern Present (01/25/2023)   [employer] [facility] [employer] - Occupational Stress Questionnaire    Feeling of Stress : Not at all  Social Connections: Not on file  Intimate Partner Violence: Not At Risk (01/25/2023)   Humiliation, Afraid, Rape, and Kick questionnaire    Fear of Current or Ex-Partner: No    Emotionally Abused: No    Physically Abused: No    Sexually Abused: No    FAMILY HISTORY: No family history on file.  ALLERGIES:  is allergic to nsaids and other.  MEDICATIONS:  Current Outpatient Medications  Medication Sig Dispense Refill   atorvastatin (LIPITOR) 20 MG tablet TAKE 1 TABLET BY MOUTH EVERY DAY AT NIGHT     dexamethasone (DECADRON) 4 MG tablet Take 2 tablets (8 mg) by mouth daily x 3 days starting the day after cisplatin chemotherapy. Take with food. 30 tablet 1   hydrochlorothiazide (HYDRODIURIL) 12.5 MG tablet Take 12.5 mg by mouth daily.     HYDROcodone-acetaminophen (NORCO/VICODIN) 5-325 MG tablet Take 1 tablet by mouth every 6 (six) hours as needed for moderate pain. 8 tablet 0   lidocaine-prilocaine (EMLA) cream Apply to affected area once 30 g 3   ondansetron (ZOFRAN) 8 MG tablet Take 1 tablet (8 mg total) by mouth every 8 (eight) hours as needed for nausea or vomiting. Start on the third day after  cisplatin. 30 tablet 1   prochlorperazine (COMPAZINE) 10 MG tablet Take 1 tablet (10 mg total) by mouth every 6 (six) hours as needed (Nausea or vomiting). 30 tablet 1   phenazopyridine (PYRIDIUM) 200 MG tablet Take 1 tablet (200 mg total) by mouth 3 (three) times daily as needed (burning with urination). (Patient not taking: Reported on 01/02/2023) 15 tablet 0   No current facility-administered medications for this visit.   Facility-Administered Medications Ordered in Other Visits  Medication Dose Route Frequency Provider Last Rate Last Admin   0.9 %  sodium chloride infusion   Intravenous Continuous Michaelyn Barter, MD  Stopped at 03/13/23 1042    REVIEW OF SYSTEMS:   Pertinent information mentioned in HPI All other systems were reviewed with the patient and are negative.  PHYSICAL EXAMINATION: ECOG PERFORMANCE STATUS: 0 - Asymptomatic  Vitals:   03/13/23 0907  BP: 119/73  Pulse: 88  Temp: (!) 97.1 F (36.2 C)  SpO2: 99%   Filed Weights   03/13/23 0907  Weight: 159 lb (72.1 kg)    GENERAL:alert, no distress and comfortable SKIN: skin color, texture, turgor are normal, no rashes or significant lesions EYES: normal, conjunctiva are pink and non-injected, sclera clear OROPHARYNX:no exudate, no erythema and lips, buccal mucosa, and tongue normal  NECK: supple, thyroid normal size, non-tender, without nodularity LYMPH:  no palpable lymphadenopathy in the cervical, axillary or inguinal LUNGS: clear to auscultation and percussion with normal breathing effort HEART: regular rate & rhythm and no murmurs and no lower extremity edema ABDOMEN:abdomen soft, non-tender and normal bowel sounds Musculoskeletal:no cyanosis of digits and no clubbing  PSYCH: alert & oriented x 3 with fluent speech NEURO: no focal motor/sensory deficits  LABORATORY DATA:  I have reviewed the data as listed Lab Results  Component Value Date   WBC 10.8 (H) 03/13/2023   HGB 12.7 (L) 03/13/2023   HCT 37.5  (L) 03/13/2023   MCV 95.9 03/13/2023   PLT 78 (L) 03/13/2023   Recent Labs    02/14/23 1035 03/06/23 0859 03/13/23 0855  NA 136 139 133*  K 3.9 4.0 4.1  CL 100 103 98  CO2 25 27 26   GLUCOSE 103* 110* 110*  BUN 31* 17 29*  CREATININE 1.26* 1.12 1.26*  CALCIUM 9.0 8.9 9.2  GFRNONAA >60 >60 >60  PROT 7.0 6.6 6.9  ALBUMIN 3.9 3.8 4.0  AST 23 19 18   ALT 22 16 24   ALKPHOS 90 74 108  BILITOT 0.3 0.6 0.9    RADIOGRAPHIC STUDIES: I have personally reviewed the radiological images as listed and agreed with the findings in the report. No results found.

## 2023-03-16 ENCOUNTER — Encounter: Payer: Self-pay | Admitting: Internal Medicine

## 2023-03-20 ENCOUNTER — Inpatient Hospital Stay: Payer: Medicare Other | Attending: Internal Medicine

## 2023-03-20 ENCOUNTER — Inpatient Hospital Stay (HOSPITAL_BASED_OUTPATIENT_CLINIC_OR_DEPARTMENT_OTHER): Payer: Medicare Other | Admitting: Internal Medicine

## 2023-03-20 VITALS — BP 126/71 | HR 70 | Temp 98.2°F | Wt 161.0 lb

## 2023-03-20 DIAGNOSIS — T451X5A Adverse effect of antineoplastic and immunosuppressive drugs, initial encounter: Secondary | ICD-10-CM | POA: Diagnosis not present

## 2023-03-20 DIAGNOSIS — F17210 Nicotine dependence, cigarettes, uncomplicated: Secondary | ICD-10-CM | POA: Diagnosis not present

## 2023-03-20 DIAGNOSIS — Z5111 Encounter for antineoplastic chemotherapy: Secondary | ICD-10-CM | POA: Insufficient documentation

## 2023-03-20 DIAGNOSIS — H938X3 Other specified disorders of ear, bilateral: Secondary | ICD-10-CM

## 2023-03-20 DIAGNOSIS — Z5189 Encounter for other specified aftercare: Secondary | ICD-10-CM | POA: Insufficient documentation

## 2023-03-20 DIAGNOSIS — N144 Toxic nephropathy, not elsewhere classified: Secondary | ICD-10-CM | POA: Diagnosis not present

## 2023-03-20 DIAGNOSIS — C679 Malignant neoplasm of bladder, unspecified: Secondary | ICD-10-CM

## 2023-03-20 LAB — CBC WITH DIFFERENTIAL (CANCER CENTER ONLY)
Abs Immature Granulocytes: 1.15 10*3/uL — ABNORMAL HIGH (ref 0.00–0.07)
Basophils Absolute: 0.2 10*3/uL — ABNORMAL HIGH (ref 0.0–0.1)
Basophils Relative: 1 %
Eosinophils Absolute: 0.4 10*3/uL (ref 0.0–0.5)
Eosinophils Relative: 3 %
HCT: 35.3 % — ABNORMAL LOW (ref 39.0–52.0)
Hemoglobin: 11.8 g/dL — ABNORMAL LOW (ref 13.0–17.0)
Immature Granulocytes: 9 %
Lymphocytes Relative: 17 %
Lymphs Abs: 2 10*3/uL (ref 0.7–4.0)
MCH: 32.5 pg (ref 26.0–34.0)
MCHC: 33.4 g/dL (ref 30.0–36.0)
MCV: 97.2 fL (ref 80.0–100.0)
Monocytes Absolute: 1.3 10*3/uL — ABNORMAL HIGH (ref 0.1–1.0)
Monocytes Relative: 11 %
Neutro Abs: 7.3 10*3/uL (ref 1.7–7.7)
Neutrophils Relative %: 59 %
Platelet Count: 188 10*3/uL (ref 150–400)
RBC: 3.63 MIL/uL — ABNORMAL LOW (ref 4.22–5.81)
RDW: 13.6 % (ref 11.5–15.5)
Smear Review: NORMAL
WBC Count: 12.3 10*3/uL — ABNORMAL HIGH (ref 4.0–10.5)
nRBC: 0 % (ref 0.0–0.2)

## 2023-03-20 LAB — CMP (CANCER CENTER ONLY)
ALT: 18 U/L (ref 0–44)
AST: 18 U/L (ref 15–41)
Albumin: 3.8 g/dL (ref 3.5–5.0)
Alkaline Phosphatase: 88 U/L (ref 38–126)
Anion gap: 7 (ref 5–15)
BUN: 20 mg/dL (ref 8–23)
CO2: 26 mmol/L (ref 22–32)
Calcium: 8.6 mg/dL — ABNORMAL LOW (ref 8.9–10.3)
Chloride: 104 mmol/L (ref 98–111)
Creatinine: 1.3 mg/dL — ABNORMAL HIGH (ref 0.61–1.24)
GFR, Estimated: >60 mL/min (ref >60)
Glucose, Bld: 101 mg/dL — ABNORMAL HIGH (ref 70–99)
Potassium: 3.8 mmol/L (ref 3.5–5.1)
Sodium: 137 mmol/L (ref 135–145)
Total Bilirubin: 0.4 mg/dL (ref 0.3–1.2)
Total Protein: 6.6 g/dL (ref 6.5–8.1)

## 2023-03-20 LAB — MAGNESIUM: Magnesium: 2.1 mg/dL (ref 1.7–2.4)

## 2023-03-20 MED FILL — Dexamethasone Sodium Phosphate Inj 100 MG/10ML: INTRAMUSCULAR | Qty: 1 | Status: AC

## 2023-03-20 MED FILL — Fosaprepitant Dimeglumine For IV Infusion 150 MG (Base Eq): INTRAVENOUS | Qty: 5 | Status: AC

## 2023-03-20 NOTE — Progress Notes (Unsigned)
Patient says that he is doing well. No new questions or concerns for the doctor today.

## 2023-03-20 NOTE — Progress Notes (Unsigned)
[facility] CONSULT NOTE  Patient Care Team: Marisue Ivan, MD as PCP - General (Family Medicine) Michaelyn Barter, MD as Consulting Physician (Oncology)   CANCER STAGING   Cancer Staging  Bladder cancer [facility]) Staging form: Urinary Bladder, AJCC 8th Edition - Clinical stage from 12/07/2022: Stage II (cT2, cN0, cM0) - Signed by Michaelyn Barter, MD on 01/02/2023 Stage prefix: Initial diagnosis WHO/ISUP grade (low/high): High Grade Histologic grading system: 2 grade system   ASSESSMENT & PLAN:  Ryan URBANEK 67 y.o. male with pmh of HTN, HLD, current smoker referred to Medical Oncology for management of bladder cancer.   # Bladder cancer, high grade, cT2 N0 M0 -Was seen by Dr. Lonna Cobb for intermittent hematuria.  Underwent cystoscopy with biopsy of mass close to right ureteral orifice, 2 masses from the bladder neck.  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  -Repeat CT chest abdomen pelvis from 01/06/2023 showed nodular wall thickening along the posterior urinary bladder consistent with known bladder cancer.  No evidence of metastatic disease in the chest abdomen pelvis.  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  - Echo showed normal EF of 55 to 60%.  -Started cycle 1 ddMVAC on 01/23/2023.  Within 1 week of initiation, patient developed bilateral constant tinnitus and decreased hearing with high-pitched sound.  Also developed nephrotoxicity with creatinine peaking at 1.6.  Supported with IV fluids. Evaluated by Dr. Jenne Campus, ENT.  Diagnosed with sensorineural hearing loss and was advised hearing aid.  Chemotherapy was placed on hold.  Patient reports some improvement in his tinnitus.  He likely has some baseline hearing issues which was exacerbated by platinum chemotherapy.  ENT was okay to continue with chemotherapy.   -Risk and benefit of continuation of platinum based chemo with  worsening ototoxicity was discussed with the patient.  Patient understands and would like to proceed.    - Switched to [facility] split dose cisplatin 30 mg/m on day 1 and day 2 with cycle 2.  Labs reviewed.  Creatinine 1.3 mildly elevated from baseline.  eGFR still above 60 and creatinine clearance 59 mL/min.  Hearing changes are stable.  Will continue with dose dense MVAC cycle 3 split dose cisplatin with G-CSF support.Marland Kitchen  He is on HCTZ 12.5 mg daily which is a small dose in less likely to affect creatinine.  But for now until we are done with the chemo have asked him to hold that.  Monitor blood pressure and if systolic more than 130 I can switch him for amlodipine until completion of chemotherapy.  # Platinum induced nephrotoxicity -Split dose cisplatin.  Support with IV's.  # Platinum induced ototoxicity -Manifested within 1 week of cycle 1 ddMVAC.  -Seen by Dr. Jenne Campus, ENT.  Likely had baseline hearing issues.  Diagnosed with sensorineural hearing loss.  Hearing aid was advised. -on split dose cisplatin with mild dose reduction.  Close monitoring.  Risk and benefit of the chemo was discussed with the patient and he like to proceed.  # Hypertension- hold hydrochlorothiazide 12.5 mg daily.  Monitor BP at home.  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   No orders of the defined types were placed in this encounter.  RTC in 1 week for APP visit, labs, fluid RTC in 2 weeks for MD visit, labs, cycle 4 dose dense MVAC.  The total time spent  in the appointment was 30 minutes encounter with patients including review of chart and various tests results, discussions about plan of care and coordination of care plan   All questions were answered. The patient knows to call the clinic with any problems, questions or concerns. No barriers to learning was detected.  Michaelyn Barter, MD 10/7/202411:32  AM   HISTORY OF PRESENTING ILLNESS:  Ryan Dixon 67 y.o. male with pmh of HTN, HLD, current smoker referred to Medical Oncology for management of bladder cancer.   Interval history Patient was seen today as follow-up prior to cycle 3 of dose dense MVAC. Doing well overall.  Denies any further changes with his hearing.  Reports has not been drinking much fluid.  Denies any neuropathy.  Denies nausea or vomiting.  I have reviewed his chart and materials related to his cancer extensively and collaborated history with the patient. Summary of oncologic history is as follows: Oncology History  Bladder cancer (HCC)  10/25/2022 Initial Diagnosis   Seen by Dr. Lonna Cobb for intermittent hematuria since February 2024 along with other urinary symptoms.   10/28/2022 Imaging   CT hematuria workup Showed 2.2 cm bladder mass worrisome for cancer.  Adjacent mild diffuse bladder wall thickening.  Based on the location this may involve the extreme distal aspect of the right UVJ.  Extensive atherosclerotic changes with the occlusion of the right common iliac artery and areas of significant stenosis along the left. Please correlate with any symptoms such as claudication and further workup as clinically appropriate.  CT chest with contrast IMPRESSION: 1. Lung-RADS 2, benign appearance or behavior. Continue annual screening with low-dose chest CT without contrast in 12 months. 2. Similar ascending aortic dilatation at 4.2 cm. Recommend attention on follow-up lung cancer screening CT in 1 year.   11/29/2022 Procedure   Intraoperative findings:  Cystoscopy-urethra normal in caliber without stricture.  Prostate with mild lateral lobe enlargement and moderate bladder neck elevation.  UOs normal-appearing bilaterally.  The right UO was anterior to the bladder tumor Bladder tumor: 3 cm papillary/nodular tumor superior to right UO and extending laterally; ~ 1 cm papillary tumor anterior bladder neck 10:00; 2-1 cm  papillary tumors anterior bladder neck 2:00    11/29/2022 Pathology Results        12/07/2022 Cancer Staging   Staging form: Urinary Bladder, AJCC 8th Edition - Clinical stage from 12/07/2022: Stage II (cT2, cN0, cM0) - Signed by Michaelyn Barter, MD on 01/02/2023 Stage prefix: Initial diagnosis WHO/ISUP grade (low/high): High Grade Histologic grading system: 2 grade system   01/02/2023 Initial Diagnosis   Bladder cancer (HCC)   01/23/2023 -  Chemotherapy   Patient is on Treatment Plan : BLADDER DOSE DENSE MVAC q14d       MEDICAL HISTORY:  Past Medical History:  Diagnosis Date   Aortic atherosclerosis (HCC)    Chronic foot pain, left    History of hematuria    Hypertension    Other emphysema (HCC)    Other male erectile dysfunction    Pre-diabetes    Pure hypercholesterolemia    Tobacco dependence     SURGICAL HISTORY: Past Surgical History:  Procedure Laterality Date   ANKLE ARTHROSCOPY Left 08/11/2020   Procedure: LEFT ANKLE ARTHROSCOPY AND DEBRIDEMENT;  Surgeon: Nadara Mustard, MD;  Location: [facility];  Service: Orthopedics;  Laterality: Left;   BLADDER INSTILLATION N/A 11/29/2022   Procedure: BLADDER INSTILLATION OF GEMCITABINE;  Surgeon: [name], MD;  Location: [facility];  Service: Urology;  Laterality: N/A;   COLONOSCOPY     IR IMAGING GUIDED PORT INSERTION  01/20/2023   TONSILLECTOMY     TRANSURETHRAL RESECTION OF BLADDER TUMOR N/A 11/29/2022   Procedure: TRANSURETHRAL RESECTION OF BLADDER TUMOR (TURBT);  Surgeon: [name], MD;  Location: [facility];  Service: Urology;  Laterality: N/A;    SOCIAL HISTORY: Social History   Socioeconomic History   Marital status: Married    Spouse name: Albin Felling   Number of children: Not on file   Years of education: Not on file   Highest education level: Not on file  Occupational History   Not on file  Tobacco Use   Smoking status: Every Day    Current packs/day: 2.00    Average packs/day: 2.0  packs/day for 43.0 years (86.0 ttl pk-yrs)    Types: Cigarettes   Smokeless tobacco: Never  Substance and Sexual Activity   Alcohol use: Yes    Comment: 1-2 beers/day   Drug use: Never   Sexual activity: Not on file  Other Topics Concern   Not on file  Social History Narrative   Not on file   Social Determinants of Health   Financial Resource Strain: Low Risk  (02/08/2023)   Received from [facility]   Overall Financial Resource Strain (CARDIA)    Difficulty of Paying Living Expenses: Not hard at all  Food Insecurity: No Food Insecurity (02/08/2023)   Received from [facility]   Hunger Vital Sign    Worried About Running Out of Food in the Last Year: Never true    Ran Out of Food in the Last Year: Never true  Transportation Needs: No Transportation Needs (02/08/2023)   Received from [facility] - Transportation    In the past 12 months, has lack of transportation kept you from medical appointments or from getting medications?: No    Lack of Transportation (Non-Medical): No  Physical Activity: Not on file  Stress: No Stress Concern Present (01/25/2023)   [employer] of Occupational Health - Occupational Stress Questionnaire    Feeling of Stress : Not at all  Social Connections: Not on file  Intimate Partner Violence: Not At Risk (01/25/2023)   Humiliation, Afraid, Rape, and Kick questionnaire    Fear of Current or Ex-Partner: No    Emotionally Abused: No    Physically Abused: No    Sexually Abused: No    FAMILY HISTORY: No family history on file.  ALLERGIES:  is allergic to nsaids and other.  MEDICATIONS:  Current Outpatient Medications  Medication Sig Dispense Refill   atorvastatin (LIPITOR) 20 MG tablet TAKE 1 TABLET BY MOUTH EVERY DAY AT NIGHT     dexamethasone (DECADRON) 4 MG tablet Take 2 tablets (8 mg) by mouth daily x 3 days starting the day after cisplatin chemotherapy. Take with food. 30 tablet  1   hydrochlorothiazide (HYDRODIURIL) 12.5 MG tablet Take 12.5 mg by mouth daily.     HYDROcodone-acetaminophen (NORCO/VICODIN) 5-325 MG tablet Take 1 tablet by mouth every 6 (six) hours as needed for moderate pain. 8 tablet 0   lidocaine-prilocaine (EMLA) cream Apply to affected area once 30 g 3   ondansetron (ZOFRAN) 8 MG tablet Take 1 tablet (8 mg total) by mouth every 8 (eight) hours as needed for nausea or vomiting. Start on the third day after cisplatin. 30 tablet 1   prochlorperazine (COMPAZINE) 10 MG tablet Take 1 tablet (10 mg total) by mouth every 6 (  six) hours as needed (Nausea or vomiting). 30 tablet 1   phenazopyridine (PYRIDIUM) 200 MG tablet Take 1 tablet (200 mg total) by mouth 3 (three) times daily as needed (burning with urination). (Patient not taking: Reported on 01/02/2023) 15 tablet 0   No current facility-administered medications for this visit.    REVIEW OF SYSTEMS:   Pertinent information mentioned in HPI All other systems were reviewed with the patient and are negative.  PHYSICAL EXAMINATION: ECOG PERFORMANCE STATUS: 0 - Asymptomatic  Vitals:   03/20/23 0930  BP: 126/71  Pulse: 70  Temp: 98.2 F (36.8 C)  SpO2: 100%   Filed Weights   03/20/23 0930  Weight: 161 lb (73 kg)    GENERAL:alert, no distress and comfortable SKIN: skin color, texture, turgor are normal, no rashes or significant lesions EYES: normal, conjunctiva are pink and non-injected, sclera clear OROPHARYNX:no exudate, no erythema and lips, buccal mucosa, and tongue normal  NECK: supple, thyroid normal size, non-tender, without nodularity LYMPH:  no palpable lymphadenopathy in the cervical, axillary or inguinal LUNGS: clear to auscultation and percussion with normal breathing effort HEART: regular rate & rhythm and no murmurs and no lower extremity edema ABDOMEN:abdomen soft, non-tender and normal bowel sounds Musculoskeletal:no cyanosis of digits and no clubbing  PSYCH: alert & oriented x  3 with fluent speech NEURO: no focal motor/sensory deficits  LABORATORY DATA:  I have reviewed the data as listed Lab Results  Component Value Date   WBC 12.3 (H) 03/20/2023   HGB 11.8 (L) 03/20/2023   HCT 35.3 (L) 03/20/2023   MCV 97.2 03/20/2023   PLT 188 03/20/2023   Recent Labs    03/06/23 0859 03/13/23 0855 03/20/23 0859  NA 139 133* 137  K 4.0 4.1 3.8  CL 103 98 104  CO2 27 26 26   GLUCOSE 110* 110* 101*  BUN 17 29* 20  CREATININE 1.12 1.26* 1.30*  CALCIUM 8.9 9.2 8.6*  GFRNONAA >60 >60 >60  PROT 6.6 6.9 6.6  ALBUMIN 3.8 4.0 3.8  AST 19 18 18   ALT 16 24 18   ALKPHOS 74 108 88  BILITOT 0.6 0.9 0.4    RADIOGRAPHIC STUDIES: I have personally reviewed the radiological images as listed and agreed with the findings in the report. No results found.

## 2023-03-21 ENCOUNTER — Ambulatory Visit: Payer: Medicare Other | Admitting: Internal Medicine

## 2023-03-21 ENCOUNTER — Other Ambulatory Visit: Payer: Self-pay

## 2023-03-21 ENCOUNTER — Inpatient Hospital Stay: Payer: Medicare Other

## 2023-03-21 ENCOUNTER — Other Ambulatory Visit: Payer: Medicare Other

## 2023-03-21 ENCOUNTER — Encounter: Payer: Self-pay | Admitting: Internal Medicine

## 2023-03-21 VITALS — BP 131/72 | HR 68 | Temp 97.9°F | Resp 16

## 2023-03-21 DIAGNOSIS — Z5111 Encounter for antineoplastic chemotherapy: Secondary | ICD-10-CM | POA: Diagnosis not present

## 2023-03-21 DIAGNOSIS — C679 Malignant neoplasm of bladder, unspecified: Secondary | ICD-10-CM

## 2023-03-21 MED ORDER — PALONOSETRON HCL INJECTION 0.25 MG/5ML
0.2500 mg | Freq: Once | INTRAVENOUS | Status: AC
  Administered 2023-03-21: 0.25 mg via INTRAVENOUS
  Filled 2023-03-21: qty 5

## 2023-03-21 MED ORDER — METHOTREXATE SODIUM CHEMO INJECTION (PF) 50 MG/2ML
30.0000 mg/m2 | Freq: Once | INTRAMUSCULAR | Status: AC
  Administered 2023-03-21: 58 mg via INTRAVENOUS
  Filled 2023-03-21: qty 2.32

## 2023-03-21 MED ORDER — MAGNESIUM SULFATE 2 GM/50ML IV SOLN
2.0000 g | Freq: Once | INTRAVENOUS | Status: AC
  Administered 2023-03-21: 2 g via INTRAVENOUS
  Filled 2023-03-21: qty 50

## 2023-03-21 MED ORDER — SODIUM CHLORIDE 0.9 % IV SOLN
10.0000 mg | Freq: Once | INTRAVENOUS | Status: AC
  Administered 2023-03-21: 10 mg via INTRAVENOUS
  Filled 2023-03-21: qty 10

## 2023-03-21 MED ORDER — SODIUM CHLORIDE 0.9 % IV SOLN
35.0000 mg/m2 | Freq: Once | INTRAVENOUS | Status: AC
  Administered 2023-03-21: 68 mg via INTRAVENOUS
  Filled 2023-03-21: qty 68

## 2023-03-21 MED ORDER — SODIUM CHLORIDE 0.9 % IV SOLN
Freq: Once | INTRAVENOUS | Status: AC
  Filled 2023-03-21: qty 250

## 2023-03-21 MED ORDER — POTASSIUM CHLORIDE IN NACL 20-0.9 MEQ/L-% IV SOLN
Freq: Once | INTRAVENOUS | Status: AC
  Filled 2023-03-21: qty 1000

## 2023-03-21 MED ORDER — SODIUM CHLORIDE 0.9 % IV SOLN
150.0000 mg | Freq: Once | INTRAVENOUS | Status: AC
  Administered 2023-03-21: 150 mg via INTRAVENOUS
  Filled 2023-03-21: qty 150

## 2023-03-21 MED FILL — Dexamethasone Sodium Phosphate Inj 100 MG/10ML: INTRAMUSCULAR | Qty: 1 | Status: AC

## 2023-03-21 NOTE — Patient Instructions (Signed)
[place] [facility]  Discharge Instructions: Thank you for choosing [facility] to provide your oncology and hematology care.  If you have a lab appointment with the Cancer Center, please go directly to the Cancer Center and check in at the registration area.  Wear comfortable clothing and clothing appropriate for easy access to any Portacath or PICC line.   We strive to give you quality time with your provider. You may need to reschedule your appointment if you arrive late (15 or more minutes).  Arriving late affects you and other patients whose appointments are after yours.  Also, if you miss three or more appointments without notifying the office, you may be dismissed from the clinic at the provider's discretion.      For prescription refill requests, have your pharmacy contact our office and allow 72 hours for refills to be completed.    Today you received the following chemotherapy and/or immunotherapy agents Methotrexate & Cisplatin   To help prevent nausea and vomiting after your treatment, we encourage you to take your nausea medication as directed.  BELOW ARE SYMPTOMS THAT SHOULD BE REPORTED IMMEDIATELY: *FEVER GREATER THAN 100.4 F (38 C) OR HIGHER *CHILLS OR SWEATING *NAUSEA AND VOMITING THAT IS NOT CONTROLLED WITH YOUR NAUSEA MEDICATION *UNUSUAL SHORTNESS OF BREATH *UNUSUAL BRUISING OR BLEEDING *URINARY PROBLEMS (pain or burning when urinating, or frequent urination) *BOWEL PROBLEMS (unusual diarrhea, constipation, pain near the anus) TENDERNESS IN MOUTH AND THROAT WITH OR WITHOUT PRESENCE OF ULCERS (sore throat, sores in mouth, or a toothache) UNUSUAL RASH, SWELLING OR PAIN  UNUSUAL VAGINAL DISCHARGE OR ITCHING   Items with * indicate a potential emergency and should be followed up as soon as possible or go to the Emergency Department if any problems should occur.  Please show the CHEMOTHERAPY ALERT CARD or IMMUNOTHERAPY ALERT CARD at  check-in to the Emergency Department and triage nurse.  Should you have questions after your visit or need to cancel or reschedule your appointment, please contact [place] [facility]  [phone] and follow the prompts.  Office hours are 8:00 a.m. to 4:30 p.m. Monday - Friday. Please note that voicemails left after 4:00 p.m. may not be returned until the following business day.  We are closed weekends and major holidays. You have access to a nurse at all times for urgent questions. Please call the main number to the clinic [phone] and follow the prompts.  For any non-urgent questions, you may also contact your provider using MyChart. We now offer e-Visits for anyone 65 and older to request care online for non-urgent symptoms. For details visit [email or web address].   Also download the MyChart app! Go to the app store, search "MyChart", open the app, select [place], and log in with your MyChart username and password.

## 2023-03-22 ENCOUNTER — Ambulatory Visit: Payer: Medicare Other

## 2023-03-22 ENCOUNTER — Inpatient Hospital Stay: Payer: Medicare Other

## 2023-03-22 VITALS — BP 144/74 | HR 82 | Temp 98.1°F | Resp 18

## 2023-03-22 DIAGNOSIS — Z5111 Encounter for antineoplastic chemotherapy: Secondary | ICD-10-CM | POA: Diagnosis not present

## 2023-03-22 DIAGNOSIS — C679 Malignant neoplasm of bladder, unspecified: Secondary | ICD-10-CM

## 2023-03-22 MED ORDER — POTASSIUM CHLORIDE IN NACL 20-0.9 MEQ/L-% IV SOLN
Freq: Once | INTRAVENOUS | Status: AC
  Filled 2023-03-22: qty 1000

## 2023-03-22 MED ORDER — VINBLASTINE SULFATE CHEMO INJECTION 1 MG/ML
3.0000 mg/m2 | Freq: Once | INTRAVENOUS | Status: AC
  Administered 2023-03-22: 5.8 mg via INTRAVENOUS
  Filled 2023-03-22: qty 5.8

## 2023-03-22 MED ORDER — SODIUM CHLORIDE 0.9 % IV SOLN
35.0000 mg/m2 | Freq: Once | INTRAVENOUS | Status: AC
  Administered 2023-03-22: 68 mg via INTRAVENOUS
  Filled 2023-03-22: qty 68

## 2023-03-22 MED ORDER — MAGNESIUM SULFATE 2 GM/50ML IV SOLN
2.0000 g | Freq: Once | INTRAVENOUS | Status: AC
  Administered 2023-03-22: 2 g via INTRAVENOUS
  Filled 2023-03-22: qty 50

## 2023-03-22 MED ORDER — DOXORUBICIN HCL CHEMO IV INJECTION 2 MG/ML
30.0000 mg/m2 | Freq: Once | INTRAVENOUS | Status: AC
  Administered 2023-03-22: 58 mg via INTRAVENOUS
  Filled 2023-03-22: qty 29

## 2023-03-22 MED ORDER — HEPARIN SOD (PORK) LOCK FLUSH 100 UNIT/ML IV SOLN
500.0000 [IU] | Freq: Once | INTRAVENOUS | Status: AC | PRN
  Administered 2023-03-22: 500 [IU]
  Filled 2023-03-22: qty 5

## 2023-03-22 MED ORDER — SODIUM CHLORIDE 0.9 % IV SOLN
10.0000 mg | Freq: Once | INTRAVENOUS | Status: AC
  Administered 2023-03-22: 10 mg via INTRAVENOUS
  Filled 2023-03-22: qty 10

## 2023-03-22 MED ORDER — SODIUM CHLORIDE 0.9 % IV SOLN
Freq: Once | INTRAVENOUS | Status: AC
  Filled 2023-03-22: qty 250

## 2023-03-22 NOTE — Patient Instructions (Signed)
[facility]  Discharge Instructions: Thank you for choosing [facility] to provide your oncology and hematology care.  If you have a lab appointment with the Cancer Center, please go directly to the Cancer Center and check in at the registration area.  Wear comfortable clothing and clothing appropriate for easy access to any Portacath or PICC line.   We strive to give you quality time with your provider. You may need to reschedule your appointment if you arrive late (15 or more minutes).  Arriving late affects you and other patients whose appointments are after yours.  Also, if you miss three or more appointments without notifying the office, you may be dismissed from the clinic at the provider's discretion.      For prescription refill requests, have your pharmacy contact our office and allow 72 hours for refills to be completed.    Today you received the following chemotherapy and/or immunotherapy agents ADRIAMYCIN, VELBAN, CISPLATIN      To help prevent nausea and vomiting after your treatment, we encourage you to take your nausea medication as directed.  BELOW ARE SYMPTOMS THAT SHOULD BE REPORTED IMMEDIATELY: *FEVER GREATER THAN 100.4 F (38 C) OR HIGHER *CHILLS OR SWEATING *NAUSEA AND VOMITING THAT IS NOT CONTROLLED WITH YOUR NAUSEA MEDICATION *UNUSUAL SHORTNESS OF BREATH *UNUSUAL BRUISING OR BLEEDING *URINARY PROBLEMS (pain or burning when urinating, or frequent urination) *BOWEL PROBLEMS (unusual diarrhea, constipation, pain near the anus) TENDERNESS IN MOUTH AND THROAT WITH OR WITHOUT PRESENCE OF ULCERS (sore throat, sores in mouth, or a toothache) UNUSUAL RASH, SWELLING OR PAIN  UNUSUAL VAGINAL DISCHARGE OR ITCHING   Items with * indicate a potential emergency and should be followed up as soon as possible or go to the Emergency Department if any problems should occur.  Please show the CHEMOTHERAPY ALERT CARD or IMMUNOTHERAPY ALERT  CARD at check-in to the Emergency Department and triage nurse.  Should you have questions after your visit or need to cancel or reschedule your appointment, please contact [facility]  [phone] and follow the prompts.  Office hours are 8:00 a.m. to 4:30 p.m. Monday - Friday. Please note that voicemails left after 4:00 p.m. may not be returned until the following business day.  We are closed weekends and major holidays. You have access to a nurse at all times for urgent questions. Please call the main number to the clinic [phone] and follow the prompts.  For any non-urgent questions, you may also contact your provider using MyChart. We now offer e-Visits for anyone 52 and older to request care online for non-urgent symptoms. For details visit [email or web address].   Also download the MyChart app! Go to the app store, search "MyChart", open the app, select [place], and log in with your MyChart username and password.   Doxorubicin Injection What is this medication? DOXORUBICIN (dox oh ROO bi sin) treats some types of cancer. It works by slowing down the growth of cancer cells. This medicine may be used for other purposes; ask your health care provider or pharmacist if you have questions. COMMON BRAND NAME(S): Adriamycin, Adriamycin PFS, Adriamycin RDF, Rubex What should I tell my care team before I take this medication? They need to know if you have any of these conditions: Heart disease History of low blood cell levels caused by a medication Liver disease Recent or ongoing radiation An unusual or allergic reaction to doxorubicin, other medications, foods, dyes, or preservatives If you  or your partner are pregnant or trying to get pregnant Breast-feeding How should I use this medication? This medication is injected into a vein. It is given by your care team in a hospital or clinic setting. Talk to your care team about the use of this medication  in children. Special care may be needed. Overdosage: If you think you have taken too much of this medicine contact a poison control center or emergency room at once. NOTE: This medicine is only for you. Do not share this medicine with others. What if I miss a dose? Keep appointments for follow-up doses. It is important not to miss your dose. Call your care team if you are unable to keep an appointment. What may interact with this medication? 6-mercaptopurine Paclitaxel Phenytoin St. John's wort Trastuzumab Verapamil This list may not describe all possible interactions. Give your health care provider a list of all the medicines, herbs, non-prescription drugs, or dietary supplements you use. Also tell them if you smoke, drink alcohol, or use illegal drugs. Some items may interact with your medicine. What should I watch for while using this medication? Your condition will be monitored carefully while you are receiving this medication. You may need blood work while taking this medication. This medication may make you feel generally unwell. This is not uncommon as chemotherapy can affect healthy cells as well as cancer cells. Report any side effects. Continue your course of treatment even though you feel ill unless your care team tells you to stop. There is a maximum amount of this medication you should receive throughout your life. The amount depends on the medical condition being treated and your overall health. Your care team will watch how much of this medication you receive. Tell your care team if you have taken this medication before. Your urine may turn red for a few days after your dose. This is not blood. If your urine is dark or brown, call your care team. In some cases, you may be given additional medications to help with side effects. Follow all directions for their use. This medication may increase your risk of getting an infection. Call your care team for advice if you get a fever,  chills, sore throat, or other symptoms of a cold or flu. Do not treat yourself. Try to avoid being around people who are sick. This medication may increase your risk to bruise or bleed. Call your care team if you notice any unusual bleeding. Talk to your care team about your risk of cancer. You may be more at risk for certain types of cancers if you take this medication. Talk to your care team if you or your partner may be pregnant. Serious birth defects can occur if you take this medication during pregnancy and for 6 months after the last dose. Contraception is recommended while taking this medication and for 6 months after the last dose. Your care team can help you find the option that works for you. If your partner can get pregnant, use a condom while taking this medication and for 6 months after the last dose. Do not breastfeed while taking this medication. This medication may cause infertility. Talk to your care team if you are concerned about your fertility. What side effects may I notice from receiving this medication? Side effects that you should report to your care team as soon as possible: Allergic reactions--skin rash, itching, hives, swelling of the face, lips, tongue, or throat Heart failure--shortness of breath, swelling of the ankles, feet, or  hands, sudden weight gain, unusual weakness or fatigue Heart rhythm changes--fast or irregular heartbeat, dizziness, feeling faint or lightheaded, chest pain, trouble breathing Infection--fever, chills, cough, sore throat, wounds that don't heal, pain or trouble when passing urine, general feeling of discomfort or being unwell Low red blood cell level--unusual weakness or fatigue, dizziness, headache, trouble breathing Painful swelling, warmth, or redness of the skin, blisters or sores at the infusion site Unusual bruising or bleeding Side effects that usually do not require medical attention (report to your care team if they continue or are  bothersome): Diarrhea Hair loss Nausea Pain, redness, or swelling with sores inside the mouth or throat Red urine This list may not describe all possible side effects. Call your doctor for medical advice about side effects. You may report side effects to FDA at 1-800-FDA-1088. Where should I keep my medication? This medication is given in a hospital or clinic. It will not be stored at home. NOTE: This sheet is a summary. It may not cover all possible information. If you have questions about this medicine, talk to your doctor, pharmacist, or health care provider.  2024 Elsevier/Gold Standard (2022-09-01 00:00:00)  Vinblastine Injection What is this medication? VINBLASTINE (vin BLAS teen) treats some types of cancer. It works by slowing down the growth of cancer cells. This medicine may be used for other purposes; ask your health care provider or pharmacist if you have questions. COMMON BRAND NAME(S): Velban What should I tell my care team before I take this medication? They need to know if you have any of these conditions: Heart disease Infection Liver disease Low white blood cell levels Lung disease An unusual or allergic reaction to vinblastine, other chemotherapy agents, other medications, foods, dyes, or preservatives Pregnant or trying to get pregnant Breast-feeding How should I use this medication? This medication is injected into a vein. It is given by your care team in a hospital or clinic setting. Talk to your care team about the use of this medication in children. While it may be given to children for selected conditions, precautions do apply. Overdosage: If you think you have taken too much of this medicine contact a poison control center or emergency room at once. NOTE: This medicine is only for you. Do not share this medicine with others. What if I miss a dose? Keep appointments for follow-up doses. It is important not to miss your dose. Call your care team if you are  unable to keep an appointment. What may interact with this medication? Erythromycin Phenytoin This medication may affect how other medications work, and other medications may affect the way this medication works. Talk with your care team about all of the medications you take. They may suggest changes to your treatment plan to lower the risk of side effects and to make sure your medications work as intended. This list may not describe all possible interactions. Give your health care provider a list of all the medicines, herbs, non-prescription drugs, or dietary supplements you use. Also tell them if you smoke, drink alcohol, or use illegal drugs. Some items may interact with your medicine. What should I watch for while using this medication? Your condition will be monitored carefully while you are receiving this medication. This medication may make you feel generally unwell. This is not uncommon as chemotherapy can affect healthy cells as well as cancer cells. Report any side effects. Continue your course of treatment even though you feel ill unless your care team tells you to stop. You  may need blood work while taking this medication. This medication will cause constipation. If you do not have a bowel movement for 3 days, call your care team. This medication may increase your risk to bruise or bleed. Call your care team if you notice any unusual bleeding. This medication may increase your risk of getting an infection. Call your care team for advice if you get a fever, chills, sore throat, or other symptoms of a cold or flu. Do not treat yourself. Try to avoid being around people who are sick. Be careful brushing or flossing your teeth or using a toothpick because you may get an infection or bleed more easily. If you have any dental work done, tell your dentist you are receiving this medication. Talk to your care team if you or your partner wish to become pregnant or think either of you might be  pregnant. This medication can cause serious birth defects. This medication may cause infertility. Talk to your care team if you are concerned about your fertility. Talk to your care team before breastfeeding. Changes to your treatment plan may be needed. What side effects may I notice from receiving this medication? Side effects that you should report to your care team as soon as possible: Allergic reactions--skin rash, itching, hives, swelling of the face, lips, tongue, or throat Infection--fever, chills, cough, sore throat, wounds that don't heal, pain or trouble when passing urine, general feeling of discomfort or being unwell Painful swelling, warmth, or redness of the skin, blisters or sores at the infusion site Shortness of breath or trouble breathing Unusual bruising or bleeding Side effects that usually do not require medical attention (report to your care team if they continue or are bothersome): Bone pain Constipation Hair loss Nausea Stomach pain Vomiting This list may not describe all possible side effects. Call your doctor for medical advice about side effects. You may report side effects to FDA at 1-800-FDA-1088. Where should I keep my medication? This medication is given in a hospital or clinic. It will not be stored at home. NOTE: This sheet is a summary. It may not cover all possible information. If you have questions about this medicine, talk to your doctor, pharmacist, or health care provider.  2024 Elsevier/Gold Standard (2021-08-24 00:00:00)  Cisplatin Injection What is this medication? CISPLATIN (SIS pla tin) treats some types of cancer. It works by slowing down the growth of cancer cells. This medicine may be used for other purposes; ask your health care provider or pharmacist if you have questions. COMMON BRAND NAME(S): Platinol, Platinol -AQ What should I tell my care team before I take this medication? They need to know if you have any of these conditions: Eye  disease, vision problems Hearing problems Kidney disease Low blood counts, such as low white cells, platelets, or red blood cells Tingling of the fingers or toes, or other nerve disorder An unusual or allergic reaction to cisplatin, carboplatin, oxaliplatin, other medications, foods, dyes, or preservatives If you or your partner are pregnant or trying to get pregnant Breast-feeding How should I use this medication? This medication is injected into a vein. It is given by your care team in a hospital or clinic setting. Talk to your care team about the use of this medication in children. Special care may be needed. Overdosage: If you think you have taken too much of this medicine contact a poison control center or emergency room at once. NOTE: This medicine is only for you. Do not share this medicine  with others. What if I miss a dose? Keep appointments for follow-up doses. It is important not to miss your dose. Call your care team if you are unable to keep an appointment. What may interact with this medication? Do not take this medication with any of the following: Live virus vaccines This medication may also interact with the following: Certain antibiotics, such as amikacin, gentamicin, neomycin, polymyxin B, streptomycin, tobramycin, vancomycin Foscarnet This list may not describe all possible interactions. Give your health care provider a list of all the medicines, herbs, non-prescription drugs, or dietary supplements you use. Also tell them if you smoke, drink alcohol, or use illegal drugs. Some items may interact with your medicine. What should I watch for while using this medication? Your condition will be monitored carefully while you are receiving this medication. You may need blood work done while taking this medication. This medication may make you feel generally unwell. This is not uncommon, as chemotherapy can affect healthy cells as well as cancer cells. Report any side effects.  Continue your course of treatment even though you feel ill unless your care team tells you to stop. This medication may increase your risk of getting an infection. Call your care team for advice if you get a fever, chills, sore throat, or other symptoms of a cold or flu. Do not treat yourself. Try to avoid being around people who are sick. Avoid taking medications that contain aspirin, acetaminophen, ibuprofen, naproxen, or ketoprofen unless instructed by your care team. These medications may hide a fever. This medication may increase your risk to bruise or bleed. Call your care team if you notice any unusual bleeding. Be careful brushing or flossing your teeth or using a toothpick because you may get an infection or bleed more easily. If you have any dental work done, tell your dentist you are receiving this medication. Drink fluids as directed while you are taking this medication. This will help protect your kidneys. Call your care team if you get diarrhea. Do not treat yourself. Talk to your care team if you or your partner wish to become pregnant or think you might be pregnant. This medication can cause serious birth defects if taken during pregnancy and for 14 months after the last dose. A negative pregnancy test is required before starting this medication. A reliable form of contraception is recommended while taking this medication and for 14 months after the last dose. Talk to your care team about effective forms of contraception. Do not father a child while taking this medication and for 11 months after the last dose. Use a condom during sex during this time period. Do not breast-feed while taking this medication. This medication may cause infertility. Talk to your care team if you are concerned about your fertility. What side effects may I notice from receiving this medication? Side effects that you should report to your care team as soon as possible: Allergic reactions--skin rash, itching,  hives, swelling of the face, lips, tongue, or throat Eye pain, change in vision, vision loss Hearing loss, ringing in ears Infection--fever, chills, cough, sore throat, wounds that don't heal, pain or trouble when passing urine, general feeling of discomfort or being unwell Kidney injury--decrease in the amount of urine, swelling of the ankles, hands, or feet Low red blood cell level--unusual weakness or fatigue, dizziness, headache, trouble breathing Painful swelling, warmth, or redness of the skin, blisters or sores at the infusion site Pain, tingling, or numbness in the hands or feet Unusual bruising  or bleeding Side effects that usually do not require medical attention (report to your care team if they continue or are bothersome): Hair loss Nausea Vomiting This list may not describe all possible side effects. Call your doctor for medical advice about side effects. You may report side effects to FDA at 1-800-FDA-1088. Where should I keep my medication? This medication is given in a hospital or clinic. It will not be stored at home. NOTE: This sheet is a summary. It may not cover all possible information. If you have questions about this medicine, talk to your doctor, pharmacist, or health care provider.  2024 Elsevier/Gold Standard (2021-10-01 00:00:00)

## 2023-03-23 ENCOUNTER — Inpatient Hospital Stay: Payer: Medicare Other

## 2023-03-23 DIAGNOSIS — Z5111 Encounter for antineoplastic chemotherapy: Secondary | ICD-10-CM | POA: Diagnosis not present

## 2023-03-23 DIAGNOSIS — C679 Malignant neoplasm of bladder, unspecified: Secondary | ICD-10-CM

## 2023-03-23 MED ORDER — PEGFILGRASTIM-FPGK 6 MG/0.6ML ~~LOC~~ SOSY
6.0000 mg | PREFILLED_SYRINGE | Freq: Once | SUBCUTANEOUS | Status: AC
  Administered 2023-03-23: 6 mg via SUBCUTANEOUS
  Filled 2023-03-23: qty 0.6

## 2023-03-27 ENCOUNTER — Inpatient Hospital Stay: Payer: Medicare Other

## 2023-03-27 ENCOUNTER — Other Ambulatory Visit: Payer: Medicare Other

## 2023-03-27 ENCOUNTER — Inpatient Hospital Stay (HOSPITAL_BASED_OUTPATIENT_CLINIC_OR_DEPARTMENT_OTHER): Payer: Medicare Other | Admitting: Nurse Practitioner

## 2023-03-27 VITALS — BP 118/71 | HR 83 | Temp 97.8°F | Wt 160.0 lb

## 2023-03-27 DIAGNOSIS — Z95828 Presence of other vascular implants and grafts: Secondary | ICD-10-CM

## 2023-03-27 DIAGNOSIS — Z09 Encounter for follow-up examination after completed treatment for conditions other than malignant neoplasm: Secondary | ICD-10-CM | POA: Diagnosis not present

## 2023-03-27 DIAGNOSIS — D649 Anemia, unspecified: Secondary | ICD-10-CM

## 2023-03-27 DIAGNOSIS — C679 Malignant neoplasm of bladder, unspecified: Secondary | ICD-10-CM

## 2023-03-27 DIAGNOSIS — Z5111 Encounter for antineoplastic chemotherapy: Secondary | ICD-10-CM | POA: Diagnosis not present

## 2023-03-27 LAB — CBC WITH DIFFERENTIAL/PLATELET
Abs Immature Granulocytes: 0.1 10*3/uL — ABNORMAL HIGH (ref 0.00–0.07)
Basophils Absolute: 0 10*3/uL (ref 0.0–0.1)
Basophils Relative: 0 %
Eosinophils Absolute: 0 10*3/uL (ref 0.0–0.5)
Eosinophils Relative: 0 %
HCT: 30.4 % — ABNORMAL LOW (ref 39.0–52.0)
Hemoglobin: 10.6 g/dL — ABNORMAL LOW (ref 13.0–17.0)
Lymphocytes Relative: 24 %
Lymphs Abs: 1.2 10*3/uL (ref 0.7–4.0)
MCH: 33.1 pg (ref 26.0–34.0)
MCHC: 34.9 g/dL (ref 30.0–36.0)
MCV: 95 fL (ref 80.0–100.0)
Metamyelocytes Relative: 2 %
Monocytes Absolute: 0.1 10*3/uL (ref 0.1–1.0)
Monocytes Relative: 3 %
Myelocytes: 1 %
Neutro Abs: 3.4 10*3/uL (ref 1.7–7.7)
Neutrophils Relative %: 70 %
Platelets: 140 10*3/uL — ABNORMAL LOW (ref 150–400)
RBC: 3.2 MIL/uL — ABNORMAL LOW (ref 4.22–5.81)
RDW: 13.7 % (ref 11.5–15.5)
Smear Review: NORMAL
WBC: 4.8 10*3/uL (ref 4.0–10.5)
nRBC: 0 % (ref 0.0–0.2)

## 2023-03-27 LAB — COMPREHENSIVE METABOLIC PANEL
ALT: 25 U/L (ref 0–44)
AST: 16 U/L (ref 15–41)
Albumin: 3.7 g/dL (ref 3.5–5.0)
Alkaline Phosphatase: 104 U/L (ref 38–126)
Anion gap: 5 (ref 5–15)
BUN: 33 mg/dL — ABNORMAL HIGH (ref 8–23)
CO2: 22 mmol/L (ref 22–32)
Calcium: 8.1 mg/dL — ABNORMAL LOW (ref 8.9–10.3)
Chloride: 108 mmol/L (ref 98–111)
Creatinine, Ser: 1.23 mg/dL (ref 0.61–1.24)
GFR, Estimated: >60 mL/min (ref >60)
Glucose, Bld: 103 mg/dL — ABNORMAL HIGH (ref 70–99)
Potassium: 4.3 mmol/L (ref 3.5–5.1)
Sodium: 135 mmol/L (ref 135–145)
Total Bilirubin: 0.8 mg/dL (ref 0.3–1.2)
Total Protein: 6.3 g/dL — ABNORMAL LOW (ref 6.5–8.1)

## 2023-03-27 MED ORDER — HEPARIN SOD (PORK) LOCK FLUSH 100 UNIT/ML IV SOLN
500.0000 [IU] | Freq: Once | INTRAVENOUS | Status: AC
  Administered 2023-03-27: 500 [IU]
  Filled 2023-03-27: qty 5

## 2023-03-27 MED ORDER — SODIUM CHLORIDE 0.9% FLUSH
10.0000 mL | Freq: Once | INTRAVENOUS | Status: AC
  Administered 2023-03-27: 10 mL via INTRAVENOUS
  Filled 2023-03-27: qty 10

## 2023-03-27 NOTE — Progress Notes (Signed)
[facility] CONSULT NOTE  Patient Care Team: Marisue Ivan, MD as PCP - General (Family Medicine) Michaelyn Barter, MD as Consulting Physician (Oncology)  CANCER STAGING   Cancer Staging  Bladder cancer [facility]) Staging form: Urinary Bladder, AJCC 8th Edition - Clinical stage from 12/07/2022: Stage II (cT2, cN0, cM0) - Signed by Michaelyn Barter, MD on 01/02/2023 Stage prefix: Initial diagnosis WHO/ISUP grade (low/high): High Grade Histologic grading system: 2 grade system  HISTORY OF PRESENTING ILLNESS:  Ryan Dixon 67 y.o. male with pmh of HTN, HLD, current smoker referred to Medical Oncology for management of bladder cancer.    Oncology History  Bladder cancer (HCC)  10/25/2022 Initial Diagnosis   Seen by Dr. Lonna Cobb for intermittent hematuria since February 2024 along with other urinary symptoms.   10/28/2022 Imaging   CT hematuria workup Showed 2.2 cm bladder mass worrisome for cancer.  Adjacent mild diffuse bladder wall thickening.  Based on the location this may involve the extreme distal aspect of the right UVJ.  Extensive atherosclerotic changes with the occlusion of the right common iliac artery and areas of significant stenosis along the left. Please correlate with any symptoms such as claudication and further workup as clinically appropriate.  CT chest with contrast IMPRESSION: 1. Lung-RADS 2, benign appearance or behavior. Continue annual screening with low-dose chest CT without contrast in 12 months. 2. Similar ascending aortic dilatation at 4.2 cm. Recommend attention on follow-up lung cancer screening CT in 1 year.   11/29/2022 Procedure   Intraoperative findings:  Cystoscopy-urethra normal in caliber without stricture.  Prostate with mild lateral lobe enlargement and moderate bladder neck elevation.  UOs normal-appearing bilaterally.  The right UO was anterior to the bladder tumor Bladder tumor: 3 cm papillary/nodular tumor superior to right UO  and extending laterally; ~ 1 cm papillary tumor anterior bladder neck 10:00; 2-1 cm papillary tumors anterior bladder neck 2:00    11/29/2022 Pathology Results        12/07/2022 Cancer Staging   Staging form: Urinary Bladder, AJCC 8th Edition - Clinical stage from 12/07/2022: Stage II (cT2, cN0, cM0) - Signed by Michaelyn Barter, MD on 01/02/2023 Stage prefix: Initial diagnosis WHO/ISUP grade (low/high): High Grade Histologic grading system: 2 grade system   01/02/2023 Initial Diagnosis   Bladder cancer (HCC)   01/23/2023 -  Chemotherapy   Patient is on Treatment Plan : BLADDER DOSE DENSE MVAC q14d      Interval history Patient returns to clinic for follow up after cycle 3 of ddMVAC chemotherapy with GCSF support. He has been eating and drinking well. Continues to work. His hearing is roughly the same and he is being fitted for hearing aids upcoming. Denies neuropathy. No nausea or vomiting.    MEDICAL HISTORY:  Past Medical History:  Diagnosis Date   Aortic atherosclerosis (HCC)    Chronic foot pain, left    History of hematuria    Hypertension    Other emphysema (HCC)    Other male erectile dysfunction    Pre-diabetes    Pure hypercholesterolemia    Tobacco dependence     SURGICAL HISTORY: Past Surgical History:  Procedure Laterality Date   ANKLE ARTHROSCOPY Left 08/11/2020   Procedure: LEFT ANKLE ARTHROSCOPY AND DEBRIDEMENT;  Surgeon: Nadara Mustard, MD;  Location: [facility];  Service: Orthopedics;  Laterality: Left;   BLADDER INSTILLATION N/A 11/29/2022   Procedure: BLADDER INSTILLATION OF GEMCITABINE;  Surgeon: [name], MD;  Location: [facility];  Service: Urology;  Laterality: N/A;   COLONOSCOPY     IR IMAGING GUIDED PORT INSERTION  01/20/2023   TONSILLECTOMY     TRANSURETHRAL RESECTION OF BLADDER TUMOR N/A 11/29/2022   Procedure: TRANSURETHRAL RESECTION OF BLADDER TUMOR (TURBT);  Surgeon: [name], MD;  Location: [facility];  Service:  Urology;  Laterality: N/A;    SOCIAL HISTORY: Social History   Socioeconomic History   Marital status: Married    Spouse name: Albin Felling   Number of children: Not on file   Years of education: Not on file   Highest education level: Not on file  Occupational History   Not on file  Tobacco Use   Smoking status: Every Day    Current packs/day: 2.00    Average packs/day: 2.0 packs/day for 43.0 years (86.0 ttl pk-yrs)    Types: Cigarettes   Smokeless tobacco: Never  Substance and Sexual Activity   Alcohol use: Yes    Comment: 1-2 beers/day   Drug use: Never   Sexual activity: Not on file  Other Topics Concern   Not on file  Social History Narrative   Not on file   Social Determinants of Health   Financial Resource Strain: Low Risk  (02/08/2023)   Received from [facility]   Overall Financial Resource Strain (CARDIA)    Difficulty of Paying Living Expenses: Not hard at all  Food Insecurity: No Food Insecurity (02/08/2023)   Received [facility]   Hunger Vital Sign    Worried About Running Out of Food in the Last Year: Never true    Ran Out of Food in the Last Year: Never true  Transportation Needs: No Transportation Needs (02/08/2023)   Received from [facility] - Transportation    In the past 12 months, has lack of transportation kept you from medical appointments or from getting medications?: No    Lack of Transportation (Non-Medical): No  Physical Activity: Not on file  Stress: No Stress Concern Present (01/25/2023)   [employer] [facility] [employer] - Occupational Stress Questionnaire    Feeling of Stress : Not at all  Social Connections: Not on file  Intimate Partner Violence: Not At Risk (01/25/2023)   Humiliation, Afraid, Rape, and Kick questionnaire    Fear of Current or Ex-Partner: No    Emotionally Abused: No    Physically Abused: No    Sexually Abused: No    FAMILY HISTORY: No family  history on file.  ALLERGIES:  is allergic to nsaids and other.  MEDICATIONS:  Current Outpatient Medications  Medication Sig Dispense Refill   atorvastatin (LIPITOR) 20 MG tablet TAKE 1 TABLET BY MOUTH EVERY DAY AT NIGHT     dexamethasone (DECADRON) 4 MG tablet Take 2 tablets (8 mg) by mouth daily x 3 days starting the day after cisplatin chemotherapy. Take with food. 30 tablet 1   hydrochlorothiazide (HYDRODIURIL) 12.5 MG tablet Take 12.5 mg by mouth daily.     HYDROcodone-acetaminophen (NORCO/VICODIN) 5-325 MG tablet Take 1 tablet by mouth every 6 (six) hours as needed for moderate pain. 8 tablet 0   lidocaine-prilocaine (EMLA) cream Apply to affected area once 30 g 3   ondansetron (ZOFRAN) 8 MG tablet Take 1 tablet (8 mg total) by mouth every 8 (eight) hours as needed for nausea or vomiting. Start on the third day after cisplatin. 30 tablet 1   prochlorperazine (COMPAZINE) 10 MG tablet Take 1 tablet (10 mg total) by mouth every 6 (  six) hours as needed (Nausea or vomiting). 30 tablet 1   phenazopyridine (PYRIDIUM) 200 MG tablet Take 1 tablet (200 mg total) by mouth 3 (three) times daily as needed (burning with urination). (Patient not taking: Reported on 01/02/2023) 15 tablet 0   No current facility-administered medications for this visit.    REVIEW OF SYSTEMS:   Review of Systems  Constitutional:  Negative for chills, fever, malaise/fatigue and weight loss.  HENT:  Positive for hearing loss. Negative for ear pain, nosebleeds, sore throat and tinnitus.   Eyes:  Negative for blurred vision and double vision.  Respiratory:  Negative for cough, hemoptysis, shortness of breath and wheezing.   Cardiovascular:  Negative for chest pain, palpitations and leg swelling.  Gastrointestinal:  Negative for abdominal pain, blood in stool, constipation, diarrhea, melena, nausea and vomiting.  Genitourinary:  Negative for dysuria and urgency.  Musculoskeletal:  Negative for back pain, falls, joint pain  and myalgias.  Skin:  Negative for itching and rash.  Neurological:  Negative for dizziness, tingling, sensory change, loss of consciousness, weakness and headaches.  Endo/Heme/Allergies:  Negative for environmental allergies. Does not bruise/bleed easily.  Psychiatric/Behavioral:  Negative for depression. The patient is not nervous/anxious and does not have insomnia.   All other systems were reviewed with the patient and are negative.  PHYSICAL EXAMINATION: ECOG PERFORMANCE STATUS: 0 - Asymptomatic  Vitals:   03/27/23 0924  BP: 118/71  Pulse: 83  Temp: 97.8 F (36.6 C)  SpO2: 100%   Filed Weights   03/27/23 0924  Weight: 160 lb (72.6 kg)   General: Well-developed, well-nourished, no acute distress. Eyes: Pink conjunctiva, anicteric sclera. Lungs: Clear to auscultation bilaterally.  No audible wheezing or coughing Heart: Regular rate and rhythm.  Abdomen: Soft, nontender, nondistended.  Musculoskeletal: No edema, cyanosis, or clubbing. Neuro: Alert, answering all questions appropriately. Cranial nerves grossly intact. Skin: No rashes or petechiae noted. Psych: Normal affect.   LABORATORY DATA:  I have reviewed the data as listed Lab Results  Component Value Date   WBC 4.8 03/27/2023   HGB 10.6 (L) 03/27/2023   HCT 30.4 (L) 03/27/2023   MCV 95.0 03/27/2023   PLT 140 (L) 03/27/2023   Recent Labs    03/13/23 0855 03/20/23 0859 03/27/23 0911  NA 133* 137 135  K 4.1 3.8 4.3  CL 98 104 108  CO2 26 26 22   GLUCOSE 110* 101* 103*  BUN 29* 20 33*  CREATININE 1.26* 1.30* 1.23  CALCIUM 9.2 8.6* 8.1*  GFRNONAA >60 >60 >60  PROT 6.9 6.6 6.3*  ALBUMIN 4.0 3.8 3.7  AST 18 18 16   ALT 24 18 25   ALKPHOS 108 88 104  BILITOT 0.9 0.4 0.8    RADIOGRAPHIC STUDIES: I have personally reviewed the radiological images as listed and agreed with the findings in the report. No results found.  ASSESSMENT & PLAN:  Ryan Dixon 67 y.o. male with pmh of HTN, HLD, current smoker  referred to Medical Oncology for management of bladder cancer.   # Bladder cancer, high grade, cT2 N0 M0 - Was seen by Dr. Lonna Cobb for intermittent hematuria.  Underwent cystoscopy with biopsy of mass close to right ureteral orifice, 2 masses from the bladder neck.  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  - Repeat CT chest abdomen pelvis from 01/06/2023 showed nodular wall thickening along the posterior urinary bladder consistent with known bladder cancer.  No evidence of metastatic disease in the  chest abdomen pelvis.  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  - Echo showed normal EF of 55 to 60%.  -Started cycle 1 ddMVAC on 01/23/2023.  Within 1 week of initiation, patient developed bilateral constant tinnitus and decreased hearing with high-pitched sound.  Also developed nephrotoxicity with creatinine peaking at 1.6.  Supported with IV fluids. Evaluated by Dr. Jenne Campus, ENT.  Diagnosed with sensorineural hearing loss and was advised hearing aid.  Chemotherapy was placed on hold.  Patient reports some improvement in his tinnitus.  He likely has some baseline hearing issues which was exacerbated by platinum chemotherapy.  ENT was okay to continue with chemotherapy.   -Risk and benefit of continuation of platinum based chemo with worsening ototoxicity was discussed with the patient.  Patient understands and would like to proceed.    - Switched to [facility] split dose cisplatin 30 mg/m on day 1 and day 2 with cycle 2.  Labs reviewed.  Creatinine 1.3 mildly elevated from baseline.  eGFR still above 60 and creatinine clearance 59 mL/min.  Hearing changes are stable.  Will continue with dose dense MVAC cycle 3 split dose cisplatin with G-CSF support..   # Platinum induced nephrotoxicity - Split dose cisplatin.  Support with IV's. - tolerating oral hydration well. Encouraged continued hydration today. Hold IVF.    # Platinum induced ototoxicity -Manifested within 1 week of cycle 1 ddMVAC.  -on split dose cisplatin with mild dose reduction.  Close monitoring.  Risk and benefit of the chemo was discussed with the patient and he like to proceed. -Seen by Dr. Jenne Campus, ENT.  Likely had baseline hearing issues.  Diagnosed with sensorineural hearing loss.  Hearing aid was advised and he is being fitted for these later this week.   # Anemia - likely bone marrow suppression d/t chemotherapy but given CKD, will plan to check ferritin and iron studies at next visit.   # Hypertension - hold hydrochlorothiazide 12.5 mg daily due to changes in eGFR.  Monitor BP at home. - BP is normal today but if he has increase in BP with systolic > 130, could switch to amlodipine until completion of chemotherapy.   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   Disposition:  Hold fluids RTC next week for consideration of cycle 4 with Dr. Alena Bills- [place]  The total time spent in the appointment was 20 minutes encounter with patients including review of chart and various tests results, discussions about plan of care and coordination of care plan   All questions were answered. The patient knows to call the clinic with any problems, questions or concerns. No barriers to learning was detected.  Alinda Dooms, NP 03/27/2023

## 2023-03-27 NOTE — Progress Notes (Signed)
Patient has been having some trouble hearing in both of ears, which he told me that he is getting fitted for hearing aids this week.

## 2023-03-31 ENCOUNTER — Encounter: Payer: Self-pay | Admitting: Urology

## 2023-04-03 ENCOUNTER — Other Ambulatory Visit: Payer: Self-pay | Admitting: Urology

## 2023-04-03 ENCOUNTER — Inpatient Hospital Stay: Payer: Medicare Other

## 2023-04-03 ENCOUNTER — Inpatient Hospital Stay (HOSPITAL_BASED_OUTPATIENT_CLINIC_OR_DEPARTMENT_OTHER): Payer: Medicare Other | Admitting: Internal Medicine

## 2023-04-03 DIAGNOSIS — Z5111 Encounter for antineoplastic chemotherapy: Secondary | ICD-10-CM | POA: Diagnosis not present

## 2023-04-03 DIAGNOSIS — D649 Anemia, unspecified: Secondary | ICD-10-CM

## 2023-04-03 DIAGNOSIS — C678 Malignant neoplasm of overlapping sites of bladder: Secondary | ICD-10-CM

## 2023-04-03 DIAGNOSIS — C679 Malignant neoplasm of bladder, unspecified: Secondary | ICD-10-CM

## 2023-04-03 LAB — CBC WITH DIFFERENTIAL (CANCER CENTER ONLY)
Abs Immature Granulocytes: 4.51 10*3/uL — ABNORMAL HIGH (ref 0.00–0.07)
Basophils Absolute: 0.1 10*3/uL (ref 0.0–0.1)
Basophils Relative: 0 %
Eosinophils Absolute: 0 10*3/uL (ref 0.0–0.5)
Eosinophils Relative: 0 %
HCT: 29.7 % — ABNORMAL LOW (ref 39.0–52.0)
Hemoglobin: 10 g/dL — ABNORMAL LOW (ref 13.0–17.0)
Immature Granulocytes: 22 %
Lymphocytes Relative: 12 %
Lymphs Abs: 2.5 10*3/uL (ref 0.7–4.0)
MCH: 32.3 pg (ref 26.0–34.0)
MCHC: 33.7 g/dL (ref 30.0–36.0)
MCV: 95.8 fL (ref 80.0–100.0)
Monocytes Absolute: 1.6 10*3/uL — ABNORMAL HIGH (ref 0.1–1.0)
Monocytes Relative: 8 %
Neutro Abs: 11.8 10*3/uL — ABNORMAL HIGH (ref 1.7–7.7)
Neutrophils Relative %: 58 %
Platelet Count: 216 10*3/uL (ref 150–400)
RBC: 3.1 MIL/uL — ABNORMAL LOW (ref 4.22–5.81)
RDW: 14.3 % (ref 11.5–15.5)
Smear Review: NORMAL
WBC Count: 20.5 10*3/uL — ABNORMAL HIGH (ref 4.0–10.5)
nRBC: 0 % (ref 0.0–0.2)

## 2023-04-03 LAB — IRON AND TIBC
Iron: 150 ug/dL (ref 45–182)
Saturation Ratios: 56 % — ABNORMAL HIGH (ref 17.9–39.5)
TIBC: 269 ug/dL (ref 250–450)
UIBC: 119 ug/dL

## 2023-04-03 LAB — MAGNESIUM: Magnesium: 1.8 mg/dL (ref 1.7–2.4)

## 2023-04-03 LAB — CMP (CANCER CENTER ONLY)
ALT: 17 U/L (ref 0–44)
AST: 17 U/L (ref 15–41)
Albumin: 3.7 g/dL (ref 3.5–5.0)
Alkaline Phosphatase: 104 U/L (ref 38–126)
Anion gap: 7 (ref 5–15)
BUN: 20 mg/dL (ref 8–23)
CO2: 25 mmol/L (ref 22–32)
Calcium: 8.4 mg/dL — ABNORMAL LOW (ref 8.9–10.3)
Chloride: 105 mmol/L (ref 98–111)
Creatinine: 1.11 mg/dL (ref 0.61–1.24)
GFR, Estimated: >60 mL/min (ref >60)
Glucose, Bld: 125 mg/dL — ABNORMAL HIGH (ref 70–99)
Potassium: 3.7 mmol/L (ref 3.5–5.1)
Sodium: 137 mmol/L (ref 135–145)
Total Bilirubin: 0.3 mg/dL (ref 0.3–1.2)
Total Protein: 6.4 g/dL — ABNORMAL LOW (ref 6.5–8.1)

## 2023-04-03 LAB — FERRITIN: Ferritin: 860 ng/mL — ABNORMAL HIGH (ref 24–336)

## 2023-04-03 MED FILL — Fosaprepitant Dimeglumine For IV Infusion 150 MG (Base Eq): INTRAVENOUS | Qty: 5 | Status: AC

## 2023-04-03 NOTE — Progress Notes (Signed)
Patient is hoping that Wednesday is going to be his last treatment, and he just met with his surgeon on 03/27/2023. He is planning on having his surgery after the first of the year 2025.

## 2023-04-03 NOTE — Progress Notes (Signed)
[name]  Patient Care Team: Ryan Ivan, MD as PCP - General (Family Medicine) Ryan Barter, MD as Consulting Physician (Oncology)   CANCER STAGING   Cancer Staging  Bladder cancer [facility]) Staging form: Urinary Bladder, AJCC 8th Edition - Clinical stage from 12/07/2022: Stage II (cT2, cN0, cM0) - Signed by Ryan Barter, MD on 01/02/2023 Stage prefix: Initial diagnosis WHO/ISUP grade (low/high): High Grade Histologic grading system: 2 grade system   ASSESSMENT & PLAN:  Ryan Dixon 67 y.o. male with pmh of HTN, HLD, current smoker referred to Medical Oncology for management of bladder cancer.   # Bladder cancer, high grade, cT2 N0 M0 -Was seen by Dr. Lonna Dixon for intermittent hematuria.  Underwent cystoscopy with biopsy of mass close to right ureteral orifice, 2 masses from the bladder neck.  All 3 biopsy showed invasive high-grade urothelial carcinoma involving muscularis propria.  -CT chest abdomen pelvis from 01/06/2023 showed nodular wall thickening along the posterior urinary bladder consistent with known bladder cancer.  No evidence of metastatic disease in the chest abdomen pelvis.  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  - Echo showed normal EF of 55 to 60%.  -Started cycle 1 ddMVAC on 01/23/2023.  Within 1 week of initiation, patient developed bilateral constant tinnitus and decreased hearing with high-pitched sound. Nephrotoxicity with creatinine peaking at 1.6.  Supported with IV fluids. Evaluated by Dr. Jenne Dixon, ENT.  Diagnosed with sensorineural hearing loss and was advised hearing aid.  Chemotherapy was placed on hold.  Patient reports some improvement in his tinnitus.  He likely has some baseline hearing issues which was exacerbated by platinum chemotherapy.  ENT was okay to continue with chemotherapy.   -Risk and benefit of continuation of platinum based chemo with worsening ototoxicity was discussed with  the patient.  Patient understands and would like to proceed.    -Switched to [facility] split dose cisplatin on day 1 and day 2 with cycle 2.  Labs reviewed and acceptable for treatment.  Will proceed with cycle 4 of dose dense MVAC with split dose cisplatin with G-CSF support.  His hearing issues are stable.  He will pick up his hearing aid this Friday.  This will be the last neoadjuvant chemotherapy.  He has seen Dr. Berneice Dixon and is planned for surgery beginning of January.  Patient prefer not to have procedure during the Christmas time.  He will also need restaging scans prior to surgery in December which has been ordered by Dr. Emmaline Dixon office.  I will follow-up with him 3 to 4 weeks after the surgery to discuss about pathology and role of any adjuvant immunotherapy.  He will follow-up in 2 weeks for labs and possible fluid for close monitoring.  # Platinum induced nephrotoxicity -Split dose cisplatin.  Support with IV's.  # Platinum induced ototoxicity -Manifested within 1 week of cycle 1 ddMVAC.  -Seen by Dr. Jenne Dixon, ENT.  Likely had baseline hearing issues.  Diagnosed with sensorineural hearing loss.  Hearing aid was advised. -on split dose cisplatin. Close monitoring.  Risk and benefit of the chemo was discussed with the patient and he like to proceed.  # Chemo induced anemia -Hemoglobin of 10 today.  Will continue to monitor. -Check iron levels today.  # Hypertension- resume hydrochlorothiazide 12.5 mg daily.  Monitor BP at home.  #HLD- lipitor  #Bilateral common iliac artery occlusion - detected on CT imaging done for bladder cancer staging - Referral to Vascular surgery made.   Orders  Placed This Encounter  Procedures   CBC with Differential (Cancer Center Only)    Standing Status:   Future    Standing Expiration Date:   04/02/2024   CMP (Cancer Center only)    Standing Status:   Future    Standing Expiration Date:   04/02/2024   Magnesium    Standing Status:   Future     Standing Expiration Date:   04/02/2024   CBC with Differential/Platelet    Standing Status:   Future    Standing Expiration Date:   04/02/2024   Comprehensive metabolic panel    Standing Status:   Future    Standing Expiration Date:   04/02/2024   Okay to proceed with chemo RTC on 11/4 for labs and possible fluid Port flush in 2 months Follow-up in 3 months for MD visit, labs  The total time spent in the appointment was 30 minutes encounter with patients including review of chart and various tests results, discussions about plan of care and coordination of care plan   All questions were answered. The patient knows to call the clinic with any problems, questions or concerns. No barriers to learning was detected.  Ryan Barter, MD 10/21/202410:22 AM   HISTORY OF PRESENTING ILLNESS:  Ryan Dixon 67 y.o. male with pmh of HTN, HLD, current smoker referred to Medical Oncology for management of bladder cancer.   Interval history Patient was seen today as follow-up prior to cycle 4 of dose dense MVAC. Doing well overall.  Denies any further changes with his hearing.  He has been fitted for the hearing aid and will be available on Friday.  Denies any worsening of tinnitus.  Blood pressure is slightly elevated advised to go back on HCTZ.  Reports mild fatigue but has been working full-time.  Denies any nausea and vomiting.  Overall he is tolerating treatment very well.  I have reviewed his chart and materials related to his cancer extensively and collaborated history with the patient. Summary of oncologic history is as follows: Oncology History  Bladder cancer (HCC)  10/25/2022 Initial Diagnosis   Seen by Dr. Lonna Dixon for intermittent hematuria since February 2024 along with other urinary symptoms.   10/28/2022 Imaging   CT hematuria workup Showed 2.2 cm bladder mass worrisome for cancer.  Adjacent mild diffuse bladder wall thickening.  Based on the location this may involve the extreme  distal aspect of the right UVJ.  Extensive atherosclerotic changes with the occlusion of the right common iliac artery and areas of significant stenosis along the left. Please correlate with any symptoms such as claudication and further workup as clinically appropriate.  CT chest with contrast IMPRESSION: 1. Lung-RADS 2, benign appearance or behavior. Continue annual screening with low-dose chest CT without contrast in 12 months. 2. Similar ascending aortic dilatation at 4.2 cm. Recommend attention on follow-up lung cancer screening CT in 1 year.   11/29/2022 Procedure   Intraoperative findings:  Cystoscopy-urethra normal in caliber without stricture.  Prostate with mild lateral lobe enlargement and moderate bladder neck elevation.  UOs normal-appearing bilaterally.  The right UO was anterior to the bladder tumor Bladder tumor: 3 cm papillary/nodular tumor superior to right UO and extending laterally; ~ 1 cm papillary tumor anterior bladder neck 10:00; 2-1 cm papillary tumors anterior bladder neck 2:00    11/29/2022 Pathology Results        12/07/2022 Cancer Staging   Staging form: Urinary Bladder, AJCC 8th Edition - Clinical stage from 12/07/2022: Stage II (cT2,  cN0, cM0) - Signed by Ryan Barter, MD on 01/02/2023 Stage prefix: Initial diagnosis WHO/ISUP grade (low/high): High Grade Histologic grading system: 2 grade system   01/02/2023 Initial Diagnosis   Bladder cancer (HCC)   01/23/2023 -  Chemotherapy   Patient is on Treatment Plan : BLADDER DOSE DENSE MVAC q14d       MEDICAL HISTORY:  Past Medical History:  Diagnosis Date   Aortic atherosclerosis (HCC)    Chronic foot pain, left    History of hematuria    Hypertension    Other emphysema (HCC)    Other male erectile dysfunction    Pre-diabetes    Pure hypercholesterolemia    Tobacco dependence     SURGICAL HISTORY: Past Surgical History:  Procedure Laterality Date   ANKLE ARTHROSCOPY Left 08/11/2020    Procedure: LEFT ANKLE ARTHROSCOPY AND DEBRIDEMENT;  Surgeon: Nadara Mustard, MD;  Location:  SURGERY CENTER;  Service: Orthopedics;  Laterality: Left;   BLADDER INSTILLATION N/A 11/29/2022   Procedure: BLADDER INSTILLATION OF GEMCITABINE;  Surgeon: [name], MD;  Location: [facility] ORS;  Service: Urology;  Laterality: N/A;   COLONOSCOPY     IR IMAGING GUIDED PORT INSERTION  01/20/2023   TONSILLECTOMY     TRANSURETHRAL RESECTION OF BLADDER TUMOR N/A 11/29/2022   Procedure: TRANSURETHRAL RESECTION OF BLADDER TUMOR (TURBT);  Surgeon: [name], MD;  Location: [facility]S;  Service: Urology;  Laterality: N/A;    SOCIAL HISTORY: Social History   Socioeconomic History   Marital status: Married    Spouse name: Albin Felling   Number of children: Not on file   Years of education: Not on file   Highest education level: Not on file  Occupational History   Not on file  Tobacco Use   Smoking status: Every Day    Current packs/day: 2.00    Average packs/day: 2.0 packs/day for 43.0 years (86.0 ttl pk-yrs)    Types: Cigarettes   Smokeless tobacco: Never  Substance and Sexual Activity   Alcohol use: Yes    Comment: 1-2 beers/day   Drug use: Never   Sexual activity: Not on file  Other Topics Concern   Not on file  Social History Narrative   Not on file   Social Determinants of Health   Financial Resource Strain: Low Risk  (02/08/2023)   Received from [facility]   Overall Financial Resource Strain (CARDIA)    Difficulty of Paying Living Expenses: Not hard at all  Food Insecurity: No Food Insecurity (02/08/2023)   Received from [facility]   Hunger Vital Sign    Worried About Running Out of Food in the Last Year: Never true    Ran Out of Food in the Last Year: Never true  Transportation Needs: No Transportation Needs (02/08/2023)   Received from [facility] - Transportation    In the past 12 months, has lack of  transportation kept you from medical appointments or from getting medications?: No    Lack of Transportation (Non-Medical): No  Physical Activity: Not on file  Stress: No Stress Concern Present (01/25/2023)   [employer] [facility] [employer] - Occupational Stress Questionnaire    Feeling of Stress : Not at all  Social Connections: Not on file  Intimate Partner Violence: Not At Risk (01/25/2023)   Humiliation, Afraid, Rape, and Kick questionnaire    Fear of Current or Ex-Partner: No    Emotionally Abused: No    Physically  Abused: No    Sexually Abused: No    FAMILY HISTORY: No family history on file.  ALLERGIES:  is allergic to nsaids and other.  MEDICATIONS:  Current Outpatient Medications  Medication Sig Dispense Refill   atorvastatin (LIPITOR) 20 MG tablet TAKE 1 TABLET BY MOUTH EVERY DAY AT NIGHT     dexamethasone (DECADRON) 4 MG tablet Take 2 tablets (8 mg) by mouth daily x 3 days starting the day after cisplatin chemotherapy. Take with food. 30 tablet 1   hydrochlorothiazide (HYDRODIURIL) 12.5 MG tablet Take 12.5 mg by mouth daily.     HYDROcodone-acetaminophen (NORCO/VICODIN) 5-325 MG tablet Take 1 tablet by mouth every 6 (six) hours as needed for moderate pain. 8 tablet 0   lidocaine-prilocaine (EMLA) cream Apply to affected area once 30 g 3   ondansetron (ZOFRAN) 8 MG tablet Take 1 tablet (8 mg total) by mouth every 8 (eight) hours as needed for nausea or vomiting. Start on the third day after cisplatin. 30 tablet 1   prochlorperazine (COMPAZINE) 10 MG tablet Take 1 tablet (10 mg total) by mouth every 6 (six) hours as needed (Nausea or vomiting). 30 tablet 1   phenazopyridine (PYRIDIUM) 200 MG tablet Take 1 tablet (200 mg total) by mouth 3 (three) times daily as needed (burning with urination). (Patient not taking: Reported on 01/02/2023) 15 tablet 0   No current facility-administered medications for this visit.    REVIEW OF SYSTEMS:   Pertinent information mentioned  in HPI All other systems were reviewed with the patient and are negative.  PHYSICAL EXAMINATION: ECOG PERFORMANCE STATUS: 0 - Asymptomatic  Vitals:   04/03/23 0929  BP: (!) 140/87  Pulse: 78  Temp: 98.6 F (37 C)  SpO2: 100%   Filed Weights   04/03/23 0929  Weight: 163 lb (73.9 kg)    GENERAL:alert, no distress and comfortable SKIN: skin color, texture, turgor are normal, no rashes or significant lesions EYES: normal, conjunctiva are pink and non-injected, sclera clear OROPHARYNX:no exudate, no erythema and lips, buccal mucosa, and tongue normal  NECK: supple, thyroid normal size, non-tender, without nodularity LYMPH:  no palpable lymphadenopathy in the cervical, axillary or inguinal LUNGS: clear to auscultation and percussion with normal breathing effort HEART: regular rate & rhythm and no murmurs and no lower extremity edema ABDOMEN:abdomen soft, non-tender and normal bowel sounds Musculoskeletal:no cyanosis of digits and no clubbing  PSYCH: alert & oriented x 3 with fluent speech NEURO: no focal motor/sensory deficits  LABORATORY DATA:  I have reviewed the data as listed Lab Results  Component Value Date   WBC 20.5 (H) 04/03/2023   HGB 10.0 (L) 04/03/2023   HCT 29.7 (L) 04/03/2023   MCV 95.8 04/03/2023   PLT 216 04/03/2023   Recent Labs    03/20/23 0859 03/27/23 0911 04/03/23 0911  NA 137 135 137  K 3.8 4.3 3.7  CL 104 108 105  CO2 26 22 25   GLUCOSE 101* 103* 125*  BUN 20 33* 20  CREATININE 1.30* 1.23 1.11  CALCIUM 8.6* 8.1* 8.4*  GFRNONAA >60 >60 >60  PROT 6.6 6.3* 6.4*  ALBUMIN 3.8 3.7 3.7  AST 18 16 17   ALT 18 25 17   ALKPHOS 88 104 104  BILITOT 0.4 0.8 0.3    RADIOGRAPHIC STUDIES: I have personally reviewed the radiological images as listed and agreed with the findings in the report. No results found.

## 2023-04-04 ENCOUNTER — Other Ambulatory Visit: Payer: Self-pay

## 2023-04-04 ENCOUNTER — Inpatient Hospital Stay: Payer: Medicare Other

## 2023-04-04 VITALS — BP 148/80 | HR 60 | Temp 96.0°F | Resp 18

## 2023-04-04 DIAGNOSIS — Z5111 Encounter for antineoplastic chemotherapy: Secondary | ICD-10-CM | POA: Diagnosis not present

## 2023-04-04 DIAGNOSIS — C679 Malignant neoplasm of bladder, unspecified: Secondary | ICD-10-CM

## 2023-04-04 MED ORDER — DEXAMETHASONE SODIUM PHOSPHATE 10 MG/ML IJ SOLN
10.0000 mg | Freq: Once | INTRAMUSCULAR | Status: AC
  Administered 2023-04-04: 10 mg via INTRAVENOUS
  Filled 2023-04-04: qty 1

## 2023-04-04 MED ORDER — METHOTREXATE SODIUM CHEMO INJECTION (PF) 50 MG/2ML
30.0000 mg/m2 | Freq: Once | INTRAMUSCULAR | Status: AC
  Administered 2023-04-04: 58 mg via INTRAVENOUS
  Filled 2023-04-04: qty 2.32

## 2023-04-04 MED ORDER — SODIUM CHLORIDE 0.9 % IV SOLN
Freq: Once | INTRAVENOUS | Status: AC
  Filled 2023-04-04: qty 250

## 2023-04-04 MED ORDER — HEPARIN SOD (PORK) LOCK FLUSH 100 UNIT/ML IV SOLN
500.0000 [IU] | Freq: Once | INTRAVENOUS | Status: AC | PRN
  Administered 2023-04-04: 500 [IU]
  Filled 2023-04-04: qty 5

## 2023-04-04 MED ORDER — FOSAPREPITANT DIMEGLUMINE INJECTION 150 MG
150.0000 mg | Freq: Once | INTRAVENOUS | Status: AC
  Administered 2023-04-04: 150 mg via INTRAVENOUS
  Filled 2023-04-04: qty 150

## 2023-04-04 MED ORDER — POTASSIUM CHLORIDE IN NACL 20-0.9 MEQ/L-% IV SOLN
Freq: Once | INTRAVENOUS | Status: AC
  Filled 2023-04-04: qty 1000

## 2023-04-04 MED ORDER — PALONOSETRON HCL INJECTION 0.25 MG/5ML
0.2500 mg | Freq: Once | INTRAVENOUS | Status: AC
Start: 2023-04-04 — End: 2023-04-04
  Administered 2023-04-04: 0.25 mg via INTRAVENOUS
  Filled 2023-04-04: qty 5

## 2023-04-04 MED ORDER — MAGNESIUM SULFATE 2 GM/50ML IV SOLN
2.0000 g | Freq: Once | INTRAVENOUS | Status: AC
  Administered 2023-04-04: 2 g via INTRAVENOUS
  Filled 2023-04-04: qty 50

## 2023-04-04 MED ORDER — CISPLATIN CHEMO INJECTION 100MG/100ML
35.0000 mg/m2 | Freq: Once | INTRAVENOUS | Status: AC
  Administered 2023-04-04: 68 mg via INTRAVENOUS
  Filled 2023-04-04: qty 68

## 2023-04-04 NOTE — Patient Instructions (Signed)
[place] [facility] AT [facility] REG[facility]  Discharge Instructions: Thank you for choosing [facility] to provide your oncology and hematology care.  If you have a lab appointment with the Cancer Center, please go directly to the Cancer Center and check in at the registration area.  Wear comfortable clothing and clothing appropriate for easy access to any Portacath or PICC line.   We strive to give you quality time with your provider. You may need to reschedule your appointment if you arrive late (15 or more minutes).  Arriving late affects you and other patients whose appointments are after yours.  Also, if you miss three or more appointments without notifying the office, you may be dismissed from the clinic at the provider's discretion.      For prescription refill requests, have your pharmacy contact our office and allow 72 hours for refills to be completed.    Today you received the following chemotherapy and/or immunotherapy agents Cisplatin and Methotrexate.       To help prevent nausea and vomiting after your treatment, we encourage you to take your nausea medication as directed.  BELOW ARE SYMPTOMS THAT SHOULD BE REPORTED IMMEDIATELY: *FEVER GREATER THAN 100.4 F (38 C) OR HIGHER *CHILLS OR SWEATING *NAUSEA AND VOMITING THAT IS NOT CONTROLLED WITH YOUR NAUSEA MEDICATION *UNUSUAL SHORTNESS OF BREATH *UNUSUAL BRUISING OR BLEEDING *URINARY PROBLEMS (pain or burning when urinating, or frequent urination) *BOWEL PROBLEMS (unusual diarrhea, constipation, pain near the anus) TENDERNESS IN MOUTH AND THROAT WITH OR WITHOUT PRESENCE OF ULCERS (sore throat, sores in mouth, or a toothache) UNUSUAL RASH, SWELLING OR PAIN  UNUSUAL VAGINAL DISCHARGE OR ITCHING   Items with * indicate a potential emergency and should be followed up as soon as possible or go to the Emergency Department if any problems should occur.  Please show the CHEMOTHERAPY ALERT CARD or IMMUNOTHERAPY ALERT  CARD at check-in to the Emergency Department and triage nurse.  Should you have questions after your visit or need to cancel or reschedule your appointment, please contact [place] [facility]  [phone] and follow the prompts.  Office hours are 8:00 a.m. to 4:30 p.m. Monday - Friday. Please note that voicemails left after 4:00 p.m. may not be returned until the following business day.  We are closed weekends and major holidays. You have access to a nurse at all times for urgent questions. Please call the main number to the clinic [phone] and follow the prompts.  For any non-urgent questions, you may also contact your provider using MyChart. We now offer e-Visits for anyone 32 and older to request care online for non-urgent symptoms. For details visit [email or web address].   Also download the MyChart app! Go to the app store, search "MyChart", open the app, select [place], and log in with your MyChart username and password.

## 2023-04-05 ENCOUNTER — Inpatient Hospital Stay: Payer: Medicare Other

## 2023-04-05 VITALS — BP 157/71 | HR 89 | Temp 96.2°F | Resp 18

## 2023-04-05 DIAGNOSIS — C679 Malignant neoplasm of bladder, unspecified: Secondary | ICD-10-CM

## 2023-04-05 DIAGNOSIS — Z5111 Encounter for antineoplastic chemotherapy: Secondary | ICD-10-CM | POA: Diagnosis not present

## 2023-04-05 MED ORDER — POTASSIUM CHLORIDE IN NACL 20-0.9 MEQ/L-% IV SOLN
Freq: Once | INTRAVENOUS | Status: AC
  Filled 2023-04-05: qty 1000

## 2023-04-05 MED ORDER — DOXORUBICIN HCL CHEMO IV INJECTION 2 MG/ML
30.0000 mg/m2 | Freq: Once | INTRAVENOUS | Status: AC
Start: 2023-04-05 — End: 2023-04-05
  Administered 2023-04-05: 58 mg via INTRAVENOUS
  Filled 2023-04-05: qty 29

## 2023-04-05 MED ORDER — MAGNESIUM SULFATE 2 GM/50ML IV SOLN
2.0000 g | Freq: Once | INTRAVENOUS | Status: AC
Start: 2023-04-05 — End: 2023-04-05
  Administered 2023-04-05: 2 g via INTRAVENOUS
  Filled 2023-04-05: qty 50

## 2023-04-05 MED ORDER — SODIUM CHLORIDE 0.9 % IV SOLN
35.0000 mg/m2 | Freq: Once | INTRAVENOUS | Status: AC
  Administered 2023-04-05: 68 mg via INTRAVENOUS
  Filled 2023-04-05: qty 68

## 2023-04-05 MED ORDER — HEPARIN SOD (PORK) LOCK FLUSH 100 UNIT/ML IV SOLN
500.0000 [IU] | Freq: Once | INTRAVENOUS | Status: DC | PRN
Start: 2023-04-05 — End: 2023-04-05
  Filled 2023-04-05: qty 5

## 2023-04-05 MED ORDER — DEXAMETHASONE SODIUM PHOSPHATE 10 MG/ML IJ SOLN
10.0000 mg | Freq: Once | INTRAMUSCULAR | Status: AC
  Administered 2023-04-05: 10 mg via INTRAVENOUS
  Filled 2023-04-05: qty 1

## 2023-04-05 MED ORDER — SODIUM CHLORIDE 0.9 % IV SOLN
Freq: Once | INTRAVENOUS | Status: AC
  Filled 2023-04-05: qty 250

## 2023-04-05 MED ORDER — VINBLASTINE SULFATE CHEMO INJECTION 1 MG/ML
3.0000 mg/m2 | Freq: Once | INTRAVENOUS | Status: AC
  Administered 2023-04-05: 5.8 mg via INTRAVENOUS
  Filled 2023-04-05: qty 5.8

## 2023-04-05 NOTE — Patient Instructions (Signed)
[place] [facility] AT [facility]  Discharge Instructions: Thank you for choosing [facility] to provide your oncology and hematology care.  If you have a lab appointment with the Cancer Center, please go directly to the Cancer Center and check in at the registration area.  Wear comfortable clothing and clothing appropriate for easy access to any Portacath or PICC line.   We strive to give you quality time with your provider. You may need to reschedule your appointment if you arrive late (15 or more minutes).  Arriving late affects you and other patients whose appointments are after yours.  Also, if you miss three or more appointments without notifying the office, you may be dismissed from the clinic at the provider's discretion.      For prescription refill requests, have your pharmacy contact our office and allow 72 hours for refills to be completed.    Today you received the following chemotherapy and/or immunotherapy agents Adriamycin, Velban & Cisplatin      To help prevent nausea and vomiting after your treatment, we encourage you to take your nausea medication as directed.  BELOW ARE SYMPTOMS THAT SHOULD BE REPORTED IMMEDIATELY: *FEVER GREATER THAN 100.4 F (38 C) OR HIGHER *CHILLS OR SWEATING *NAUSEA AND VOMITING THAT IS NOT CONTROLLED WITH YOUR NAUSEA MEDICATION *UNUSUAL SHORTNESS OF BREATH *UNUSUAL BRUISING OR BLEEDING *URINARY PROBLEMS (pain or burning when urinating, or frequent urination) *BOWEL PROBLEMS (unusual diarrhea, constipation, pain near the anus) TENDERNESS IN MOUTH AND THROAT WITH OR WITHOUT PRESENCE OF ULCERS (sore throat, sores in mouth, or a toothache) UNUSUAL RASH, SWELLING OR PAIN  UNUSUAL VAGINAL DISCHARGE OR ITCHING   Items with * indicate a potential emergency and should be followed up as soon as possible or go to the Emergency Department if any problems should occur.  Please show the CHEMOTHERAPY ALERT CARD or IMMUNOTHERAPY ALERT  CARD at check-in to the Emergency Department and triage nurse.  Should you have questions after your visit or need to cancel or reschedule your appointment, please contact [place] [facility] AT [facility]  [phone] and follow the prompts.  Office hours are 8:00 a.m. to 4:30 p.m. Monday - Friday. Please note that voicemails left after 4:00 p.m. may not be returned until the following business day.  We are closed weekends and major holidays. You have access to a nurse at all times for urgent questions. Please call the main number to the clinic [phone] and follow the prompts.  For any non-urgent questions, you may also contact your provider using MyChart. We now offer e-Visits for anyone 20 and older to request care online for non-urgent symptoms. For details visit [email or web address].   Also download the MyChart app! Go to the app store, search "MyChart", open the app, select [place], and log in with your MyChart username and password.

## 2023-04-06 ENCOUNTER — Inpatient Hospital Stay: Payer: Medicare Other

## 2023-04-06 ENCOUNTER — Other Ambulatory Visit: Payer: Self-pay | Admitting: Urology

## 2023-04-06 DIAGNOSIS — C679 Malignant neoplasm of bladder, unspecified: Secondary | ICD-10-CM

## 2023-04-06 DIAGNOSIS — Z5111 Encounter for antineoplastic chemotherapy: Secondary | ICD-10-CM | POA: Diagnosis not present

## 2023-04-06 DIAGNOSIS — C678 Malignant neoplasm of overlapping sites of bladder: Secondary | ICD-10-CM

## 2023-04-06 MED ORDER — PEGFILGRASTIM-FPGK 6 MG/0.6ML ~~LOC~~ SOSY
6.0000 mg | PREFILLED_SYRINGE | Freq: Once | SUBCUTANEOUS | Status: AC
  Administered 2023-04-06: 6 mg via SUBCUTANEOUS
  Filled 2023-04-06: qty 0.6

## 2023-04-17 ENCOUNTER — Inpatient Hospital Stay: Payer: Medicare Other

## 2023-04-17 ENCOUNTER — Other Ambulatory Visit: Payer: Self-pay

## 2023-04-17 ENCOUNTER — Inpatient Hospital Stay: Payer: Medicare Other | Attending: Internal Medicine

## 2023-04-17 VITALS — BP 123/65 | HR 80 | Temp 98.0°F

## 2023-04-17 DIAGNOSIS — C679 Malignant neoplasm of bladder, unspecified: Secondary | ICD-10-CM

## 2023-04-17 DIAGNOSIS — Z5189 Encounter for other specified aftercare: Secondary | ICD-10-CM | POA: Insufficient documentation

## 2023-04-17 DIAGNOSIS — Z95828 Presence of other vascular implants and grafts: Secondary | ICD-10-CM

## 2023-04-17 LAB — CBC WITH DIFFERENTIAL (CANCER CENTER ONLY)
Abs Immature Granulocytes: 5.66 10*3/uL — ABNORMAL HIGH (ref 0.00–0.07)
Basophils Absolute: 0.1 10*3/uL (ref 0.0–0.1)
Basophils Relative: 0 %
Eosinophils Absolute: 0 10*3/uL (ref 0.0–0.5)
Eosinophils Relative: 0 %
HCT: 26.4 % — ABNORMAL LOW (ref 39.0–52.0)
Hemoglobin: 9.1 g/dL — ABNORMAL LOW (ref 13.0–17.0)
Immature Granulocytes: 23 %
Lymphocytes Relative: 9 %
Lymphs Abs: 2.3 10*3/uL (ref 0.7–4.0)
MCH: 32.9 pg (ref 26.0–34.0)
MCHC: 34.5 g/dL (ref 30.0–36.0)
MCV: 95.3 fL (ref 80.0–100.0)
Monocytes Absolute: 2.3 10*3/uL — ABNORMAL HIGH (ref 0.1–1.0)
Monocytes Relative: 9 %
Neutro Abs: 14.7 10*3/uL — ABNORMAL HIGH (ref 1.7–7.7)
Neutrophils Relative %: 59 %
Platelet Count: 182 10*3/uL (ref 150–400)
RBC: 2.77 MIL/uL — ABNORMAL LOW (ref 4.22–5.81)
RDW: 14.6 % (ref 11.5–15.5)
Smear Review: NORMAL
WBC Count: 25.1 10*3/uL — ABNORMAL HIGH (ref 4.0–10.5)
nRBC: 0 % (ref 0.0–0.2)

## 2023-04-17 LAB — CMP (CANCER CENTER ONLY)
ALT: 16 U/L (ref 0–44)
AST: 18 U/L (ref 15–41)
Albumin: 3.8 g/dL (ref 3.5–5.0)
Alkaline Phosphatase: 106 U/L (ref 38–126)
Anion gap: 7 (ref 5–15)
BUN: 33 mg/dL — ABNORMAL HIGH (ref 8–23)
CO2: 23 mmol/L (ref 22–32)
Calcium: 8.4 mg/dL — ABNORMAL LOW (ref 8.9–10.3)
Chloride: 106 mmol/L (ref 98–111)
Creatinine: 1.64 mg/dL — ABNORMAL HIGH (ref 0.61–1.24)
GFR, Estimated: 46 mL/min — ABNORMAL LOW (ref >60)
Glucose, Bld: 112 mg/dL — ABNORMAL HIGH (ref 70–99)
Potassium: 3.8 mmol/L (ref 3.5–5.1)
Sodium: 136 mmol/L (ref 135–145)
Total Bilirubin: 0.5 mg/dL (ref <1.2)
Total Protein: 6.1 g/dL — ABNORMAL LOW (ref 6.5–8.1)

## 2023-04-17 MED ORDER — HEPARIN SOD (PORK) LOCK FLUSH 100 UNIT/ML IV SOLN
500.0000 [IU] | Freq: Once | INTRAVENOUS | Status: AC
  Administered 2023-04-17: 500 [IU] via INTRAVENOUS
  Filled 2023-04-17: qty 5

## 2023-04-17 MED ORDER — ALTEPLASE 2 MG IJ SOLR
2.0000 mg | Freq: Once | INTRAMUSCULAR | Status: AC
  Administered 2023-04-17: 2 mg
  Filled 2023-04-17: qty 2

## 2023-04-17 MED ORDER — SODIUM CHLORIDE 0.9% FLUSH
10.0000 mL | Freq: Once | INTRAVENOUS | Status: AC
  Administered 2023-04-17: 10 mL via INTRAVENOUS
  Filled 2023-04-17: qty 10

## 2023-04-17 MED ORDER — SODIUM CHLORIDE 0.9 % IV SOLN
INTRAVENOUS | Status: DC
  Filled 2023-04-17 (×2): qty 250

## 2023-04-24 ENCOUNTER — Other Ambulatory Visit: Payer: Self-pay

## 2023-04-24 DIAGNOSIS — C679 Malignant neoplasm of bladder, unspecified: Secondary | ICD-10-CM

## 2023-04-28 ENCOUNTER — Encounter: Payer: Self-pay | Admitting: Internal Medicine

## 2023-05-01 ENCOUNTER — Inpatient Hospital Stay: Payer: Medicare Other

## 2023-05-01 ENCOUNTER — Other Ambulatory Visit: Payer: Medicare Other

## 2023-05-01 DIAGNOSIS — C679 Malignant neoplasm of bladder, unspecified: Secondary | ICD-10-CM | POA: Diagnosis not present

## 2023-05-01 LAB — CBC (CANCER CENTER ONLY)
HCT: 26.8 % — ABNORMAL LOW (ref 39.0–52.0)
Hemoglobin: 8.9 g/dL — ABNORMAL LOW (ref 13.0–17.0)
MCH: 32.5 pg (ref 26.0–34.0)
MCHC: 33.2 g/dL (ref 30.0–36.0)
MCV: 97.8 fL (ref 80.0–100.0)
Platelet Count: 232 10*3/uL (ref 150–400)
RBC: 2.74 MIL/uL — ABNORMAL LOW (ref 4.22–5.81)
RDW: 15.7 % — ABNORMAL HIGH (ref 11.5–15.5)
WBC Count: 8.7 10*3/uL (ref 4.0–10.5)
nRBC: 0 % (ref 0.0–0.2)

## 2023-05-01 LAB — CMP (CANCER CENTER ONLY)
ALT: 12 U/L (ref 0–44)
AST: 15 U/L (ref 15–41)
Albumin: 3.7 g/dL (ref 3.5–5.0)
Alkaline Phosphatase: 93 U/L (ref 38–126)
Anion gap: 9 (ref 5–15)
BUN: 35 mg/dL — ABNORMAL HIGH (ref 8–23)
CO2: 24 mmol/L (ref 22–32)
Calcium: 8.7 mg/dL — ABNORMAL LOW (ref 8.9–10.3)
Chloride: 103 mmol/L (ref 98–111)
Creatinine: 1.54 mg/dL — ABNORMAL HIGH (ref 0.61–1.24)
GFR, Estimated: 50 mL/min — ABNORMAL LOW (ref >60)
Glucose, Bld: 109 mg/dL — ABNORMAL HIGH (ref 70–99)
Potassium: 4.5 mmol/L (ref 3.5–5.1)
Sodium: 136 mmol/L (ref 135–145)
Total Bilirubin: 0.4 mg/dL (ref <1.2)
Total Protein: 6.6 g/dL (ref 6.5–8.1)

## 2023-05-01 MED ORDER — HEPARIN SOD (PORK) LOCK FLUSH 100 UNIT/ML IV SOLN
500.0000 [IU] | Freq: Once | INTRAVENOUS | Status: AC
  Administered 2023-05-01: 500 [IU] via INTRAVENOUS
  Filled 2023-05-01: qty 5

## 2023-05-01 MED ORDER — SODIUM CHLORIDE 0.9 % IV SOLN
INTRAVENOUS | Status: DC
  Filled 2023-05-01 (×2): qty 250

## 2023-05-01 MED ORDER — SODIUM CHLORIDE 0.9% FLUSH
10.0000 mL | Freq: Once | INTRAVENOUS | Status: AC
Start: 2023-05-01 — End: 2023-05-01
  Administered 2023-05-01: 10 mL via INTRAVENOUS
  Filled 2023-05-01: qty 10

## 2023-05-09 ENCOUNTER — Other Ambulatory Visit: Payer: Self-pay | Admitting: Urology

## 2023-05-09 ENCOUNTER — Ambulatory Visit: Payer: Medicare Other

## 2023-05-09 ENCOUNTER — Encounter (HOSPITAL_COMMUNITY): Payer: Self-pay

## 2023-05-09 ENCOUNTER — Ambulatory Visit
Admission: RE | Admit: 2023-05-09 | Discharge: 2023-05-09 | Disposition: A | Discharge status: Home / Self Care | Payer: Medicare Other | Source: Ambulatory Visit | Attending: Urology | Admitting: Urology

## 2023-05-09 DIAGNOSIS — C678 Malignant neoplasm of overlapping sites of bladder: Secondary | ICD-10-CM | POA: Diagnosis present

## 2023-05-09 MED ORDER — IOHEXOL 300 MG/ML  SOLN
100.0000 mL | Freq: Once | INTRAMUSCULAR | Status: AC | PRN
  Administered 2023-05-09: 100 mL via INTRAVENOUS

## 2023-05-11 ENCOUNTER — Other Ambulatory Visit: Payer: Self-pay

## 2023-05-16 ENCOUNTER — Encounter: Payer: Self-pay | Admitting: Internal Medicine

## 2023-05-26 ENCOUNTER — Ambulatory Visit (HOSPITAL_COMMUNITY)
Admission: RE | Admit: 2023-05-26 | Discharge: 2023-05-26 | Disposition: A | Discharge status: Home / Self Care | Payer: Medicare Other | Source: Ambulatory Visit | Attending: Plastic Surgery | Admitting: Plastic Surgery

## 2023-05-26 DIAGNOSIS — C679 Malignant neoplasm of bladder, unspecified: Secondary | ICD-10-CM | POA: Insufficient documentation

## 2023-05-26 DIAGNOSIS — Z7189 Other specified counseling: Secondary | ICD-10-CM | POA: Diagnosis not present

## 2023-05-26 DIAGNOSIS — Z932 Ileostomy status: Secondary | ICD-10-CM | POA: Diagnosis present

## 2023-05-26 NOTE — Progress Notes (Signed)
[facility]   Reason for visit:  Marking for ileal conduit.  Presurgical counseling and education.  Here with wife today.  HPI:  Bladder cancer.  Cystectomy 07/06/23 Past Medical History:  Diagnosis Date   Aortic atherosclerosis (HCC)    Chronic foot pain, left    History of hematuria    Hypertension    Other emphysema (HCC)    Other male erectile dysfunction    Pre-diabetes    Pure hypercholesterolemia    Tobacco dependence    No family history on file. Allergies  Allergen Reactions   Nsaids Other (See Comments)    GI upset   Other Other (See Comments)    Anti-inflammatories cause GI upset, N/V   Current Outpatient Medications  Medication Sig Dispense Refill Last Dose/Taking   atorvastatin (LIPITOR) 20 MG tablet TAKE 1 TABLET BY MOUTH EVERY DAY AT NIGHT      dexamethasone (DECADRON) 4 MG tablet Take 2 tablets (8 mg) by mouth daily x 3 days starting the day after cisplatin chemotherapy. Take with food. 30 tablet 1    hydrochlorothiazide (HYDRODIURIL) 12.5 MG tablet Take 12.5 mg by mouth daily.      HYDROcodone-acetaminophen (NORCO/VICODIN) 5-325 MG tablet Take 1 tablet by mouth every 6 (six) hours as needed for moderate pain. 8 tablet 0    lidocaine-prilocaine (EMLA) cream Apply to affected area once 30 g 3    ondansetron (ZOFRAN) 8 MG tablet Take 1 tablet (8 mg total) by mouth every 8 (eight) hours as needed for nausea or vomiting. Start on the third day after cisplatin. 30 tablet 1    phenazopyridine (PYRIDIUM) 200 MG tablet Take 1 tablet (200 mg total) by mouth 3 (three) times daily as needed (burning with urination). (Patient not taking: Reported on 01/02/2023) 15 tablet 0    prochlorperazine (COMPAZINE) 10 MG tablet Take 1 tablet (10 mg total) by mouth every 6 (six) hours as needed (Nausea or vomiting). 30 tablet 1    No current facility-administered medications for this encounter.   ROS  Review of Systems  Constitutional: Negative.   Gastrointestinal:  Negative.   Genitourinary:        Bladder cancer  Skin: Negative.   All other systems reviewed and are negative.  Vital signs:  BP (!) 152/73 (BP Location: Right Arm)   Pulse 88   Temp 97.7 F (36.5 C) (Oral)   Resp 20   SpO2 100%  Exam:  Physical Exam Vitals reviewed.  Constitutional:      Appearance: Normal appearance.  HENT:     Mouth/Throat:     Mouth: Mucous membranes are moist.  Cardiovascular:     Rate and Rhythm: Regular rhythm.  Abdominal:     Palpations: Abdomen is soft.     Comments: Due to waistline and most common clothing worn, a marking at umbilical level was best for patient.  Due to prominent ribcage, a higher marking may not have a flat surface.   Skin:    General: Skin is warm and dry.  Neurological:     Mental Status: He is alert and oriented to person, place, and time.  Psychiatric:        Mood and Affect: Mood normal.        Behavior: Behavior normal.     WOC Nurse requested for preoperative stoma site marking  Discussed surgical procedure and stoma creation with patient and family.  Explained role of the WOC nurse team.  Provided the patient with educational booklet  and provided samples of pouching options.  Answered patient and family questions. We discussed life with a urostomy, demonstrated pouch styles.  We simulated emptying, discussed bedside drainage.  He and his wife asked appropriate questions, they are anxious about the major upcoming surgery but prepared.    Examined patient lying, sitting, and standing in order to place the marking in the patient's visual field, away from any creases or abdominal contour issues and within the rectus muscle.  A  Marked for ileal conduit in the RMQ  4 cm to the right of the umbilicus and  1 cm above the umbilicus.  Patient's abdomen cleansed with CHG wipes at site markings, allowed to air dry prior to marking.Covered mark with thin film transparent dressing to preserve mark until date of surgery.  He has one  month until surgery.  He understands how to darken the area until surgery.   WOC Nurse team will follow up with patient after surgery for continue ostomy care and teaching.   Impression/dx  Bladder cancer with upcoming cystectomy with ileal conduit Discussion  Ileal conduit, post operative stents that will be present.  LIfe with an ostomy Plan  Will see post operatively as needed in clinic.     Visit time: 55 minutes.   Mike Gip FNP-BC

## 2023-05-26 NOTE — Discharge Instructions (Signed)
 [email or web address]

## 2023-05-31 DIAGNOSIS — Z7189 Other specified counseling: Secondary | ICD-10-CM | POA: Insufficient documentation

## 2023-06-05 ENCOUNTER — Inpatient Hospital Stay: Payer: Medicare Other | Attending: Internal Medicine

## 2023-06-05 DIAGNOSIS — Z452 Encounter for adjustment and management of vascular access device: Secondary | ICD-10-CM | POA: Diagnosis present

## 2023-06-05 DIAGNOSIS — C679 Malignant neoplasm of bladder, unspecified: Secondary | ICD-10-CM | POA: Insufficient documentation

## 2023-06-05 DIAGNOSIS — Z95828 Presence of other vascular implants and grafts: Secondary | ICD-10-CM

## 2023-06-05 MED ORDER — SODIUM CHLORIDE 0.9% FLUSH
10.0000 mL | INTRAVENOUS | Status: DC | PRN
Start: 2023-06-05 — End: 2023-06-05
  Administered 2023-06-05: 10 mL via INTRAVENOUS
  Filled 2023-06-05: qty 10

## 2023-06-05 MED ORDER — HEPARIN SOD (PORK) LOCK FLUSH 100 UNIT/ML IV SOLN
500.0000 [IU] | Freq: Once | INTRAVENOUS | Status: AC
  Administered 2023-06-05: 500 [IU] via INTRAVENOUS
  Filled 2023-06-05: qty 5

## 2023-06-27 ENCOUNTER — Telehealth: Payer: Self-pay | Admitting: *Deleted

## 2023-06-27 NOTE — Telephone Encounter (Signed)
 Rn spoke with patient regarding change for appointment date/time. Patient's cystoscopy is not until 1/24. Dr. Mervyn Skeeters would like to see patient first week of Feb.  New apts given to pt for 2/6 at 945 for port flush and 10 am with Dr. Mervyn Skeeters.

## 2023-06-28 ENCOUNTER — Other Ambulatory Visit: Payer: Self-pay

## 2023-07-03 ENCOUNTER — Ambulatory Visit: Payer: Medicare Other | Admitting: Internal Medicine

## 2023-07-03 ENCOUNTER — Other Ambulatory Visit: Payer: Medicare Other

## 2023-07-06 ENCOUNTER — Other Ambulatory Visit: Payer: Self-pay

## 2023-07-06 ENCOUNTER — Encounter (HOSPITAL_COMMUNITY): Payer: Self-pay | Admitting: Urology

## 2023-07-06 ENCOUNTER — Inpatient Hospital Stay (HOSPITAL_COMMUNITY)
Admission: RE | Admit: 2023-07-06 | Discharge: 2023-07-12 | DRG: 654 | Disposition: A | Discharge status: Home Health | Payer: Medicare Other | Attending: Urology | Admitting: Urology

## 2023-07-06 DIAGNOSIS — I10 Essential (primary) hypertension: Secondary | ICD-10-CM | POA: Diagnosis present

## 2023-07-06 DIAGNOSIS — K4020 Bilateral inguinal hernia, without obstruction or gangrene, not specified as recurrent: Secondary | ICD-10-CM | POA: Diagnosis present

## 2023-07-06 DIAGNOSIS — C775 Secondary and unspecified malignant neoplasm of intrapelvic lymph nodes: Secondary | ICD-10-CM | POA: Diagnosis present

## 2023-07-06 DIAGNOSIS — J438 Other emphysema: Secondary | ICD-10-CM | POA: Diagnosis present

## 2023-07-06 DIAGNOSIS — K429 Umbilical hernia without obstruction or gangrene: Secondary | ICD-10-CM | POA: Diagnosis present

## 2023-07-06 DIAGNOSIS — K567 Ileus, unspecified: Secondary | ICD-10-CM | POA: Diagnosis not present

## 2023-07-06 DIAGNOSIS — C679 Malignant neoplasm of bladder, unspecified: Principal | ICD-10-CM | POA: Diagnosis present

## 2023-07-06 DIAGNOSIS — E7800 Pure hypercholesterolemia, unspecified: Secondary | ICD-10-CM | POA: Diagnosis present

## 2023-07-06 DIAGNOSIS — F17210 Nicotine dependence, cigarettes, uncomplicated: Secondary | ICD-10-CM | POA: Diagnosis present

## 2023-07-06 LAB — COMPREHENSIVE METABOLIC PANEL
ALT: 18 U/L (ref 0–44)
AST: 20 U/L (ref 15–41)
Albumin: 3.8 g/dL (ref 3.5–5.0)
Alkaline Phosphatase: 70 U/L (ref 38–126)
Anion gap: 9 (ref 5–15)
BUN: 27 mg/dL — ABNORMAL HIGH (ref 8–23)
CO2: 22 mmol/L (ref 22–32)
Calcium: 8.7 mg/dL — ABNORMAL LOW (ref 8.9–10.3)
Chloride: 104 mmol/L (ref 98–111)
Creatinine, Ser: 1.54 mg/dL — ABNORMAL HIGH (ref 0.61–1.24)
GFR, Estimated: 50 mL/min — ABNORMAL LOW (ref >60)
Glucose, Bld: 96 mg/dL (ref 70–99)
Potassium: 4 mmol/L (ref 3.5–5.1)
Sodium: 135 mmol/L (ref 135–145)
Total Bilirubin: 0.4 mg/dL (ref 0.0–1.2)
Total Protein: 6.5 g/dL (ref 6.5–8.1)

## 2023-07-06 LAB — CBC
HCT: 30.4 % — ABNORMAL LOW (ref 39.0–52.0)
Hemoglobin: 10.3 g/dL — ABNORMAL LOW (ref 13.0–17.0)
MCH: 33.8 pg (ref 26.0–34.0)
MCHC: 33.9 g/dL (ref 30.0–36.0)
MCV: 99.7 fL (ref 80.0–100.0)
Platelets: 293 10*3/uL (ref 150–400)
RBC: 3.05 MIL/uL — ABNORMAL LOW (ref 4.22–5.81)
RDW: 12.7 % (ref 11.5–15.5)
WBC: 8.1 10*3/uL (ref 4.0–10.5)
nRBC: 0 % (ref 0.0–0.2)

## 2023-07-06 LAB — SURGICAL PCR SCREEN
MRSA, PCR: NEGATIVE
Staphylococcus aureus: NEGATIVE

## 2023-07-06 MED ORDER — SODIUM CHLORIDE 0.9% FLUSH
10.0000 mL | INTRAVENOUS | Status: DC | PRN
  Administered 2023-07-11: 10 mL

## 2023-07-06 MED ORDER — NEOMYCIN SULFATE 500 MG PO TABS
500.0000 mg | ORAL_TABLET | ORAL | Status: AC
  Administered 2023-07-06 (×2): 500 mg via ORAL
  Filled 2023-07-06 (×2): qty 1

## 2023-07-06 MED ORDER — CHLORHEXIDINE GLUCONATE CLOTH 2 % EX PADS
6.0000 | MEDICATED_PAD | Freq: Every day | CUTANEOUS | Status: DC
Start: 2023-07-06 — End: 2023-07-07
  Administered 2023-07-06: 6 via TOPICAL

## 2023-07-06 MED ORDER — PEG 3350-KCL-NA BICARB-NACL 420 G PO SOLR
4000.0000 mL | Freq: Once | ORAL | Status: AC
Start: 2023-07-06 — End: 2023-07-06
  Administered 2023-07-06: 4000 mL via ORAL

## 2023-07-06 MED ORDER — SODIUM CHLORIDE 0.9 % IV SOLN: INTRAVENOUS | Status: DC

## 2023-07-06 MED ORDER — VARENICLINE TARTRATE 0.5 MG PO TABS
1.0000 mg | ORAL_TABLET | Freq: Two times a day (BID) | ORAL | Status: DC
  Administered 2023-07-06 – 2023-07-12 (×11): 1 mg via ORAL
  Filled 2023-07-06: qty 1
  Filled 2023-07-06: qty 2
  Filled 2023-07-06: qty 1
  Filled 2023-07-06: qty 2
  Filled 2023-07-06 (×8): qty 1

## 2023-07-06 MED ORDER — PIPERACILLIN-TAZOBACTAM 3.375 G IVPB 30 MIN
3.3750 g | Freq: Once | INTRAVENOUS | Status: AC
Start: 2023-07-07 — End: 2023-07-07
  Administered 2023-07-07: 3.375 g via INTRAVENOUS
  Filled 2023-07-06: qty 50

## 2023-07-06 MED ORDER — MUPIROCIN 2 % EX OINT
1.0000 | TOPICAL_OINTMENT | Freq: Two times a day (BID) | CUTANEOUS | Status: AC
  Administered 2023-07-06 – 2023-07-11 (×9): 1 via NASAL
  Filled 2023-07-06 (×2): qty 22

## 2023-07-06 MED ORDER — SODIUM CHLORIDE 0.9% FLUSH
10.0000 mL | Freq: Two times a day (BID) | INTRAVENOUS | Status: DC
  Administered 2023-07-08: 40 mL
  Administered 2023-07-09 – 2023-07-11 (×4): 10 mL

## 2023-07-06 MED ORDER — METRONIDAZOLE 500 MG PO TABS
500.0000 mg | ORAL_TABLET | ORAL | Status: AC
  Administered 2023-07-06 (×2): 500 mg via ORAL
  Filled 2023-07-06 (×2): qty 1

## 2023-07-06 MED ORDER — ATORVASTATIN CALCIUM 20 MG PO TABS
20.0000 mg | ORAL_TABLET | Freq: Every day | ORAL | Status: DC
  Administered 2023-07-08 – 2023-07-12 (×5): 20 mg via ORAL
  Filled 2023-07-06 (×5): qty 1

## 2023-07-06 NOTE — Plan of Care (Signed)
  Problem: Education: Goal: Knowledge of General Education information will improve Description: Including pain rating scale, medication(s)/side effects and non-pharmacologic comfort measures Outcome: Progressing   Problem: Clinical Measurements: Goal: Will remain free from infection Outcome: Progressing Goal: Diagnostic test results will improve Outcome: Progressing   Problem: Activity: Goal: Risk for activity intolerance will decrease Outcome: Progressing   Problem: Nutrition: Goal: Adequate nutrition will be maintained Outcome: Progressing   Problem: Coping: Goal: Level of anxiety will decrease Outcome: Progressing

## 2023-07-06 NOTE — Anesthesia Preprocedure Evaluation (Addendum)
Anesthesia Evaluation  Patient identified by MRN, date of birth, ID band Patient awake    Reviewed: Allergy & Precautions, NPO status , Patient's Chart, lab work & pertinent test results, reviewed documented beta blocker date and time   History of Anesthesia Complications Negative for: history of anesthetic complications  Airway Mallampati: II  TM Distance: >3 FB     Dental  (+) Partial Lower, Partial Upper   Pulmonary COPD, Current Smoker and Patient abstained from smoking.   breath sounds clear to auscultation + decreased breath sounds      Cardiovascular hypertension, Pt. on medications (-) angina Normal cardiovascular exam Rhythm:Regular Rate:Normal  Echo 01/18/23 1. Left ventricular ejection fraction, by estimation, is 55 to 60%. The  left ventricle has normal function. The left ventricle has no regional  wall motion abnormalities. Left ventricular diastolic parameters were  normal.   2. Right ventricular systolic function is normal. The right ventricular  size is normal. There is normal pulmonary artery systolic pressure.   3. The mitral valve is normal in structure. Trivial mitral valve  regurgitation. No evidence of mitral stenosis.   4. The aortic valve has an indeterminant number of cusps. There is mild  calcification of the aortic valve. There is moderate thickening of the  aortic valve. Aortic valve regurgitation is not visualized. Aortic valve  sclerosis is present, with no  evidence of aortic valve stenosis.   5. The inferior vena cava is normal in size with greater than 50%  respiratory variability, suggesting right atrial pressure of 3 mmHg.   EKG 11/30/22 NSR, Normal   Neuro/Psych negative neurological ROS  negative psych ROS   GI/Hepatic negative GI ROS, Neg liver ROS,,,  Endo/Other  negative endocrine ROS    Renal/GU Renal InsufficiencyRenal diseaseHx/o cisplatinum nephrotoxicity   Bladder Ca     Musculoskeletal negative musculoskeletal ROS (+)    Abdominal   Peds negative pediatric ROS (+)  Hematology negative hematology ROS (+)   Anesthesia Other Findings   Reproductive/Obstetrics                              Anesthesia Physical Anesthesia Plan  ASA: 2  Anesthesia Plan: General   Post-op Pain Management: Dilaudid IV, Tylenol PO (pre-op)* and Precedex   Induction: Intravenous  PONV Risk Score and Plan: 3 and Treatment may vary due to age or medical condition, Midazolam, Ondansetron and Dexamethasone  Airway Management Planned: Oral ETT  Additional Equipment: None and ClearSight  Intra-op Plan:   Post-operative Plan: Extubation in OR  Informed Consent: I have reviewed the patients History and Physical, chart, labs and discussed the procedure including the risks, benefits and alternatives for the proposed anesthesia with the patient or authorized representative who has indicated his/her understanding and acceptance.     Dental advisory given  Plan Discussed with: CRNA and Anesthesiologist  Anesthesia Plan Comments:          Anesthesia Quick Evaluation

## 2023-07-07 ENCOUNTER — Encounter (HOSPITAL_COMMUNITY)
Admission: RE | Disposition: A | Discharge status: Home Health | Payer: Self-pay | Source: Home / Self Care | Attending: Urology

## 2023-07-07 ENCOUNTER — Other Ambulatory Visit: Payer: Self-pay

## 2023-07-07 ENCOUNTER — Encounter (HOSPITAL_COMMUNITY): Payer: Self-pay | Admitting: Urology

## 2023-07-07 ENCOUNTER — Inpatient Hospital Stay (HOSPITAL_COMMUNITY): Payer: Medicare Other | Admitting: Anesthesiology

## 2023-07-07 DIAGNOSIS — I10 Essential (primary) hypertension: Secondary | ICD-10-CM | POA: Diagnosis not present

## 2023-07-07 DIAGNOSIS — E7800 Pure hypercholesterolemia, unspecified: Secondary | ICD-10-CM

## 2023-07-07 DIAGNOSIS — C679 Malignant neoplasm of bladder, unspecified: Secondary | ICD-10-CM | POA: Diagnosis not present

## 2023-07-07 HISTORY — PX: ROBOT ASSISTED LAPAROSCOPIC COMPLETE CYSTECT ILEAL CONDUIT: SHX5139

## 2023-07-07 HISTORY — PX: ROBOT ASSISTED LAPAROSCOPIC RADICAL PROSTATECTOMY: SHX5141

## 2023-07-07 HISTORY — PX: PELVIC LYMPH NODE DISSECTION: SHX6543

## 2023-07-07 HISTORY — PX: CYSTOSCOPY WITH INJECTION: SHX1424

## 2023-07-07 LAB — PREPARE RBC (CROSSMATCH)

## 2023-07-07 LAB — HEMOGLOBIN AND HEMATOCRIT, BLOOD
HCT: 31 % — ABNORMAL LOW (ref 39.0–52.0)
Hemoglobin: 10.2 g/dL — ABNORMAL LOW (ref 13.0–17.0)

## 2023-07-07 LAB — ABO/RH: ABO/RH(D): B POS

## 2023-07-07 SURGERY — CYSTECTOMY, ROBOT-ASSISTED, WITH ILEAL CONDUIT CREATION
Anesthesia: General | Site: Bladder

## 2023-07-07 MED ORDER — HYDROMORPHONE HCL 1 MG/ML IJ SOLN
0.5000 mg | INTRAMUSCULAR | Status: DC | PRN
Start: 2023-07-07 — End: 2023-07-12
  Administered 2023-07-07: 0.5 mg via INTRAVENOUS
  Administered 2023-07-08 – 2023-07-10 (×3): 1 mg via INTRAVENOUS
  Filled 2023-07-07 (×4): qty 1

## 2023-07-07 MED ORDER — ALBUMIN HUMAN 5 % IV SOLN
INTRAVENOUS | Status: AC
  Filled 2023-07-07: qty 250

## 2023-07-07 MED ORDER — LIDOCAINE HCL (CARDIAC) PF 100 MG/5ML IV SOSY
PREFILLED_SYRINGE | INTRAVENOUS | Status: DC | PRN
  Administered 2023-07-07: 80 mg via INTRAVENOUS

## 2023-07-07 MED ORDER — SENNOSIDES-DOCUSATE SODIUM 8.6-50 MG PO TABS
2.0000 | ORAL_TABLET | Freq: Every day | ORAL | Status: DC
  Administered 2023-07-07 – 2023-07-11 (×5): 2 via ORAL
  Filled 2023-07-07 (×5): qty 2

## 2023-07-07 MED ORDER — ALBUTEROL SULFATE HFA 108 (90 BASE) MCG/ACT IN AERS
INHALATION_SPRAY | RESPIRATORY_TRACT | Status: DC | PRN
  Administered 2023-07-07 (×2): 6 via RESPIRATORY_TRACT

## 2023-07-07 MED ORDER — WATER FOR IRRIGATION, STERILE IR SOLN
Status: DC | PRN
  Administered 2023-07-07: 1000 mL

## 2023-07-07 MED ORDER — ONDANSETRON HCL 4 MG/2ML IJ SOLN
INTRAMUSCULAR | Status: DC | PRN
  Administered 2023-07-07: 4 mg via INTRAVENOUS

## 2023-07-07 MED ORDER — HYDROMORPHONE HCL 1 MG/ML IJ SOLN
INTRAMUSCULAR | Status: AC
  Administered 2023-07-07: 0.25 mg via INTRAVENOUS
  Filled 2023-07-07: qty 1

## 2023-07-07 MED ORDER — ALVIMOPAN 12 MG PO CAPS
12.0000 mg | ORAL_CAPSULE | Freq: Two times a day (BID) | ORAL | Status: DC
  Administered 2023-07-08 – 2023-07-10 (×5): 12 mg via ORAL
  Filled 2023-07-07 (×5): qty 1

## 2023-07-07 MED ORDER — MIDAZOLAM HCL 2 MG/2ML IJ SOLN
INTRAMUSCULAR | Status: AC
  Filled 2023-07-07: qty 2

## 2023-07-07 MED ORDER — HYDROMORPHONE HCL 1 MG/ML IJ SOLN
INTRAMUSCULAR | Status: DC | PRN
  Administered 2023-07-07: .4 mg via INTRAVENOUS
  Administered 2023-07-07: .6 mg via INTRAVENOUS
  Administered 2023-07-07: .8 mg via INTRAVENOUS
  Administered 2023-07-07: .2 mg via INTRAVENOUS

## 2023-07-07 MED ORDER — BUPIVACAINE LIPOSOME 1.3 % IJ SUSP
INTRAMUSCULAR | Status: DC | PRN
  Administered 2023-07-07: 20 mL

## 2023-07-07 MED ORDER — STERILE WATER FOR INJECTION IJ SOLN
INTRAMUSCULAR | Status: DC | PRN
  Administered 2023-07-07: 10 mL

## 2023-07-07 MED ORDER — ONDANSETRON HCL 4 MG/2ML IJ SOLN: 4.0000 mg | INTRAMUSCULAR | Status: DC | PRN

## 2023-07-07 MED ORDER — PHENYLEPHRINE HCL (PRESSORS) 10 MG/ML IV SOLN
INTRAVENOUS | Status: DC | PRN
  Administered 2023-07-07: 160 ug via INTRAVENOUS

## 2023-07-07 MED ORDER — ACETAMINOPHEN 10 MG/ML IV SOLN
1000.0000 mg | Freq: Four times a day (QID) | INTRAVENOUS | Status: AC
  Administered 2023-07-07 – 2023-07-08 (×4): 1000 mg via INTRAVENOUS
  Filled 2023-07-07 (×4): qty 100

## 2023-07-07 MED ORDER — PROPOFOL 10 MG/ML IV BOLUS
INTRAVENOUS | Status: DC | PRN
  Administered 2023-07-07: 120 mg via INTRAVENOUS
  Administered 2023-07-07: 30 mg via INTRAVENOUS

## 2023-07-07 MED ORDER — ONDANSETRON HCL 4 MG/2ML IJ SOLN
INTRAMUSCULAR | Status: AC
  Filled 2023-07-07: qty 2

## 2023-07-07 MED ORDER — ONDANSETRON HCL 4 MG/2ML IJ SOLN
4.0000 mg | Freq: Once | INTRAMUSCULAR | Status: DC | PRN
Start: 2023-07-07 — End: 2023-07-07

## 2023-07-07 MED ORDER — MIDAZOLAM HCL 2 MG/2ML IJ SOLN
INTRAMUSCULAR | Status: DC | PRN
  Administered 2023-07-07: 2 mg via INTRAVENOUS

## 2023-07-07 MED ORDER — SODIUM CHLORIDE 0.9 % IV SOLN: INTRAVENOUS | Status: DC | PRN

## 2023-07-07 MED ORDER — DEXAMETHASONE SODIUM PHOSPHATE 4 MG/ML IJ SOLN
INTRAMUSCULAR | Status: DC | PRN
  Administered 2023-07-07: 8 mg via INTRAVENOUS

## 2023-07-07 MED ORDER — ROCURONIUM BROMIDE 10 MG/ML (PF) SYRINGE
PREFILLED_SYRINGE | INTRAVENOUS | Status: AC
  Filled 2023-07-07: qty 10

## 2023-07-07 MED ORDER — BUPIVACAINE LIPOSOME 1.3 % IJ SUSP
INTRAMUSCULAR | Status: AC
Start: 2023-07-07 — End: ?
  Filled 2023-07-07: qty 20

## 2023-07-07 MED ORDER — OXYCODONE HCL 5 MG PO TABS
5.0000 mg | ORAL_TABLET | ORAL | Status: DC | PRN
  Administered 2023-07-08 – 2023-07-11 (×15): 5 mg via ORAL
  Filled 2023-07-07 (×15): qty 1

## 2023-07-07 MED ORDER — HYDROMORPHONE HCL 1 MG/ML IJ SOLN
0.2500 mg | INTRAMUSCULAR | Status: DC | PRN
Start: 2023-07-07 — End: 2023-07-07
  Administered 2023-07-07: 0.25 mg via INTRAVENOUS

## 2023-07-07 MED ORDER — DIPHENHYDRAMINE HCL 50 MG/ML IJ SOLN
12.5000 mg | Freq: Four times a day (QID) | INTRAMUSCULAR | Status: DC | PRN
Start: 2023-07-07 — End: 2023-07-12

## 2023-07-07 MED ORDER — PHENYLEPHRINE HCL-NACL 20-0.9 MG/250ML-% IV SOLN
INTRAVENOUS | Status: DC | PRN
  Administered 2023-07-07: 30 ug/min via INTRAVENOUS

## 2023-07-07 MED ORDER — SODIUM CHLORIDE (PF) 0.9 % IJ SOLN
INTRAMUSCULAR | Status: DC | PRN
  Administered 2023-07-07: 10 mL

## 2023-07-07 MED ORDER — SODIUM CHLORIDE (PF) 0.9 % IJ SOLN
INTRAMUSCULAR | Status: AC
  Filled 2023-07-07: qty 20

## 2023-07-07 MED ORDER — LIDOCAINE HCL (PF) 2 % IJ SOLN
INTRAMUSCULAR | Status: AC
  Filled 2023-07-07: qty 5

## 2023-07-07 MED ORDER — HYDROMORPHONE HCL 2 MG/ML IJ SOLN
INTRAMUSCULAR | Status: AC
  Filled 2023-07-07: qty 1

## 2023-07-07 MED ORDER — SODIUM CHLORIDE 0.45 % IV SOLN
INTRAVENOUS | Status: AC
Start: 2023-07-07 — End: 2023-07-08

## 2023-07-07 MED ORDER — FENTANYL CITRATE (PF) 100 MCG/2ML IJ SOLN
INTRAMUSCULAR | Status: DC | PRN
  Administered 2023-07-07 (×2): 50 ug via INTRAVENOUS

## 2023-07-07 MED ORDER — DEXAMETHASONE SODIUM PHOSPHATE 10 MG/ML IJ SOLN
INTRAMUSCULAR | Status: AC
  Filled 2023-07-07: qty 1

## 2023-07-07 MED ORDER — KETAMINE HCL 50 MG/5ML IJ SOSY
PREFILLED_SYRINGE | INTRAMUSCULAR | Status: AC
Start: 2023-07-07 — End: ?
  Filled 2023-07-07: qty 5

## 2023-07-07 MED ORDER — OXYCODONE HCL 5 MG PO TABS
5.0000 mg | ORAL_TABLET | Freq: Once | ORAL | Status: AC | PRN
  Administered 2023-07-07: 5 mg via ORAL

## 2023-07-07 MED ORDER — KETAMINE HCL 10 MG/ML IJ SOLN
INTRAMUSCULAR | Status: DC | PRN
  Administered 2023-07-07: 20 mg via INTRAVENOUS
  Administered 2023-07-07: 30 mg via INTRAVENOUS

## 2023-07-07 MED ORDER — FENTANYL CITRATE (PF) 100 MCG/2ML IJ SOLN
INTRAMUSCULAR | Status: AC
  Filled 2023-07-07: qty 2

## 2023-07-07 MED ORDER — OXYCODONE HCL 5 MG PO TABS
ORAL_TABLET | ORAL | Status: AC
  Filled 2023-07-07: qty 1

## 2023-07-07 MED ORDER — PROPOFOL 500 MG/50ML IV EMUL
INTRAVENOUS | Status: AC
  Filled 2023-07-07: qty 50

## 2023-07-07 MED ORDER — ALBUMIN HUMAN 5 % IV SOLN: INTRAVENOUS | Status: DC | PRN

## 2023-07-07 MED ORDER — OXYCODONE HCL 5 MG/5ML PO SOLN: 5.0000 mg | Freq: Once | ORAL | Status: AC | PRN

## 2023-07-07 MED ORDER — ALVIMOPAN 12 MG PO CAPS: 12.0000 mg | ORAL_CAPSULE | ORAL | Status: DC

## 2023-07-07 MED ORDER — DIPHENHYDRAMINE HCL 12.5 MG/5ML PO ELIX
12.5000 mg | ORAL_SOLUTION | Freq: Four times a day (QID) | ORAL | Status: DC | PRN
Start: 2023-07-07 — End: 2023-07-12

## 2023-07-07 MED ORDER — PHENYLEPHRINE 80 MCG/ML (10ML) SYRINGE FOR IV PUSH (FOR BLOOD PRESSURE SUPPORT)
PREFILLED_SYRINGE | INTRAVENOUS | Status: AC
  Filled 2023-07-07: qty 10

## 2023-07-07 MED ORDER — ROCURONIUM BROMIDE 100 MG/10ML IV SOLN
INTRAVENOUS | Status: DC | PRN
  Administered 2023-07-07: 30 mg via INTRAVENOUS
  Administered 2023-07-07: 80 mg via INTRAVENOUS
  Administered 2023-07-07 (×2): 20 mg via INTRAVENOUS

## 2023-07-07 MED ORDER — ROCURONIUM BROMIDE 10 MG/ML (PF) SYRINGE
PREFILLED_SYRINGE | INTRAVENOUS | Status: AC
  Filled 2023-07-07: qty 30

## 2023-07-07 MED ORDER — ALBUTEROL SULFATE HFA 108 (90 BASE) MCG/ACT IN AERS
INHALATION_SPRAY | RESPIRATORY_TRACT | Status: AC
Start: 2023-07-07 — End: ?
  Filled 2023-07-07: qty 6.7

## 2023-07-07 MED ORDER — SUGAMMADEX SODIUM 200 MG/2ML IV SOLN
INTRAVENOUS | Status: DC | PRN
  Administered 2023-07-07: 200 mg via INTRAVENOUS

## 2023-07-07 MED ORDER — PIPERACILLIN-TAZOBACTAM 3.375 G IVPB
3.3750 g | Freq: Three times a day (TID) | INTRAVENOUS | Status: AC
  Administered 2023-07-07 – 2023-07-08 (×3): 3.375 g via INTRAVENOUS
  Filled 2023-07-07 (×3): qty 50

## 2023-07-07 MED ORDER — SODIUM CHLORIDE 0.9 % IR SOLN
Status: DC | PRN
  Administered 2023-07-07: 3000 mL

## 2023-07-07 MED ORDER — LACTATED RINGERS IV SOLN: INTRAVENOUS | Status: DC

## 2023-07-07 MED ORDER — DROPERIDOL 2.5 MG/ML IJ SOLN: 0.6250 mg | Freq: Once | INTRAMUSCULAR | Status: DC | PRN

## 2023-07-07 MED ORDER — PIPERACILLIN-TAZOBACTAM 3.375 G IVPB 30 MIN: 3.3750 g | Freq: Three times a day (TID) | INTRAVENOUS | Status: DC

## 2023-07-07 MED ORDER — CHLORHEXIDINE GLUCONATE 0.12 % MT SOLN
15.0000 mL | Freq: Once | OROMUCOSAL | Status: AC
  Administered 2023-07-07: 15 mL via OROMUCOSAL

## 2023-07-07 SURGICAL SUPPLY — 114 items
APPLICATOR COTTON TIP 6 STRL (MISCELLANEOUS) ×2 IMPLANT
APPLICATOR COTTON TIP 6IN STRL (MISCELLANEOUS) ×2
APPLICATOR SURGIFLO ENDO (HEMOSTASIS) IMPLANT
BAG COUNTER SPONGE SURGICOUNT (BAG) IMPLANT
BAG LAPAROSCOPIC 12 15 PORT 16 (BASKET) ×2 IMPLANT
BAG RETRIEVAL 12/15 (BASKET) ×2
BAG URO CATCHER STRL LF (MISCELLANEOUS) ×2 IMPLANT
BENZOIN TINCTURE PRP APPL 2/3 (GAUZE/BANDAGES/DRESSINGS) ×2 IMPLANT
BLADE SURG SZ10 CARB STEEL (BLADE) IMPLANT
CATH FOLEY 2WAY SLVR 18FR 30CC (CATHETERS) ×2 IMPLANT
CATH SILICONE 5CC 18FR (INSTRUMENTS) ×2 IMPLANT
CATH TIEMANN FOLEY 18FR 5CC (CATHETERS) ×2 IMPLANT
CHLORAPREP W/TINT 26 (MISCELLANEOUS) ×2 IMPLANT
CLIP LIGATING HEM O LOK PURPLE (MISCELLANEOUS) ×4 IMPLANT
CLIP LIGATING HEMO LOK XL GOLD (MISCELLANEOUS) ×4 IMPLANT
CLIP LIGATING HEMO O LOK GREEN (MISCELLANEOUS) ×2 IMPLANT
CLOTH BEACON ORANGE TIMEOUT ST (SAFETY) ×2 IMPLANT
CNTNR URN SCR LID CUP LEK RST (MISCELLANEOUS) ×2 IMPLANT
COVER MAYO STAND STRL (DRAPES) ×2 IMPLANT
COVER SURGICAL LIGHT HANDLE (MISCELLANEOUS) ×2 IMPLANT
COVER TIP SHEARS 8 DVNC (MISCELLANEOUS) ×2 IMPLANT
CUTTER ECHEON FLEX ENDO 45 340 (ENDOMECHANICALS) ×2 IMPLANT
DERMABOND ADVANCED .7 DNX12 (GAUZE/BANDAGES/DRESSINGS) ×4 IMPLANT
DRAIN CHANNEL RND F F (WOUND CARE) IMPLANT
DRAIN PENROSE 0.5X18 (DRAIN) IMPLANT
DRAPE ARM DVNC X/XI (DISPOSABLE) ×8 IMPLANT
DRAPE COLUMN DVNC XI (DISPOSABLE) ×2 IMPLANT
DRAPE SURG IRRIG POUCH 19X23 (DRAPES) ×2 IMPLANT
DRIVER NDL LRG 8 DVNC XI (INSTRUMENTS) ×4 IMPLANT
DRIVER NDLE LRG 8 DVNC XI (INSTRUMENTS) ×4 IMPLANT
DRSG TEGADERM 4X4.75 (GAUZE/BANDAGES/DRESSINGS) ×2 IMPLANT
ELECT PENCIL ROCKER SW 15FT (MISCELLANEOUS) ×2 IMPLANT
ELECT REM PT RETURN 15FT ADLT (MISCELLANEOUS) ×2 IMPLANT
FORCEPS BPLR LNG DVNC XI (INSTRUMENTS) ×2 IMPLANT
FORCEPS PROGRASP DVNC XI (FORCEP) ×2 IMPLANT
GAUZE 4X4 16PLY ~~LOC~~+RFID DBL (SPONGE) IMPLANT
GAUZE SPONGE 2X2 8PLY STRL LF (GAUZE/BANDAGES/DRESSINGS) IMPLANT
GAUZE SPONGE 4X4 12PLY STRL (GAUZE/BANDAGES/DRESSINGS) ×2 IMPLANT
GLOVE BIO SURGEON STRL SZ 6.5 (GLOVE) ×4 IMPLANT
GLOVE BIOGEL PI IND STRL 7.5 (GLOVE) ×2 IMPLANT
GLOVE SURG LX STRL 7.5 STRW (GLOVE) ×4 IMPLANT
GOWN SRG XL LVL 4 BRTHBL STRL (GOWNS) ×2 IMPLANT
GOWN STRL REUS W/ TWL XL LVL3 (GOWN DISPOSABLE) ×4 IMPLANT
GOWN STRL SURGICAL XL XLNG (GOWN DISPOSABLE) ×4 IMPLANT
HOLDER FOLEY CATH W/STRAP (MISCELLANEOUS) ×2 IMPLANT
IRRIG SUCT STRYKERFLOW 2 WTIP (MISCELLANEOUS) ×2
IRRIGATION SUCT STRKRFLW 2 WTP (MISCELLANEOUS) ×2 IMPLANT
IV LACTATED RINGERS 1000ML (IV SOLUTION) ×2 IMPLANT
KIT PROCEDURE DVNC SI (MISCELLANEOUS) ×2 IMPLANT
KIT TURNOVER KIT A (KITS) IMPLANT
LOOP VESSEL MAXI BLUE (MISCELLANEOUS) ×2 IMPLANT
MANIFOLD NEPTUNE II (INSTRUMENTS) ×2 IMPLANT
NDL ASPIRATION 22 (NEEDLE) ×2 IMPLANT
NDL INSUFFLATION 14GA 120MM (NEEDLE) ×2 IMPLANT
NDL SPNL 22GX7 QUINCKE BK (NEEDLE) ×2 IMPLANT
NEEDLE ASPIRATION 22 (NEEDLE) ×2 IMPLANT
NEEDLE INSUFFLATION 14GA 120MM (NEEDLE) ×2 IMPLANT
NEEDLE SPNL 22GX7 QUINCKE BK (NEEDLE) ×2 IMPLANT
PACK CYSTO (CUSTOM PROCEDURE TRAY) ×2 IMPLANT
PACK ROBOT UROLOGY CUSTOM (CUSTOM PROCEDURE TRAY) ×2 IMPLANT
PAD POSITIONING PINK XL (MISCELLANEOUS) ×2 IMPLANT
PLUG CATH AND CAP STRL 200 (CATHETERS) ×2 IMPLANT
PORT ACCESS TROCAR AIRSEAL 12 (TROCAR) ×2 IMPLANT
RELOAD STAPLE 45 4.1 GRN THCK (STAPLE) ×2 IMPLANT
RELOAD STAPLE 60 2.6 WHT THN (STAPLE) ×6 IMPLANT
RELOAD STAPLE 60 4.1 GRN THCK (STAPLE) ×6 IMPLANT
RETRACTOR LONRSTAR 16.6X16.6CM (MISCELLANEOUS) IMPLANT
RETRACTOR STAY HOOK 5MM (MISCELLANEOUS) IMPLANT
RETRACTOR STER APS 16.6X16.6CM (MISCELLANEOUS)
RETRACTOR WND ALEXIS 18 MED (MISCELLANEOUS) ×2 IMPLANT
RTRCTR WOUND ALEXIS 18CM MED (MISCELLANEOUS) ×2
SCISSORS MNPLR CVD DVNC XI (INSTRUMENTS) ×2 IMPLANT
SEAL UNIV 5-12 XI (MISCELLANEOUS) ×8 IMPLANT
SET CYSTO W/LG BORE CLAMP LF (SET/KITS/TRAYS/PACK) IMPLANT
SET TRI-LUMEN FLTR TB AIRSEAL (TUBING) ×2 IMPLANT
SOL ELECTROSURG ANTI STICK (MISCELLANEOUS) ×2
SOL PREP POV-IOD 4OZ 10% (MISCELLANEOUS) ×2 IMPLANT
SOLUTION ELECTROSURG ANTI STCK (MISCELLANEOUS) ×2 IMPLANT
SPIKE FLUID TRANSFER (MISCELLANEOUS) ×2 IMPLANT
SPONGE T-LAP 18X18 ~~LOC~~+RFID (SPONGE) ×2 IMPLANT
SPONGE T-LAP 4X18 ~~LOC~~+RFID (SPONGE) ×2 IMPLANT
STAPLE RELOAD 45 GRN (STAPLE) ×2
STAPLER ECHELON LONG 60 440 (INSTRUMENTS) ×2 IMPLANT
STAPLER RELOAD GREEN 60MM (STAPLE) ×6
STAPLER RELOAD WHITE 60MM (STAPLE) ×6
STENT SET URETHERAL LEFT 7FR (STENTS) ×2 IMPLANT
STENT SET URETHERAL RIGHT 7FR (STENTS) ×2 IMPLANT
SURGIFLO W/THROMBIN 8M KIT (HEMOSTASIS) IMPLANT
SUT CHROMIC 4 0 RB 1X27 (SUTURE) ×2 IMPLANT
SUT ETHIBOND 0 (SUTURE) IMPLANT
SUT ETHILON 3 0 PS 1 (SUTURE) ×2 IMPLANT
SUT MNCRL AB 4-0 PS2 18 (SUTURE) ×4 IMPLANT
SUT NOVA NAB DX-16 0-1 5-0 T12 (SUTURE) IMPLANT
SUT PDS AB 1 CT1 27 (SUTURE) ×6 IMPLANT
SUT SILK 3 0 SH 30 (SUTURE) IMPLANT
SUT SILK 3 0 SH CR/8 (SUTURE) ×2 IMPLANT
SUT VIC AB 2-0 CT1 27XBRD (SUTURE) IMPLANT
SUT VIC AB 2-0 SH 18 (SUTURE) IMPLANT
SUT VIC AB 2-0 SH 27X BRD (SUTURE) ×2 IMPLANT
SUT VIC AB 2-0 UR5 27 (SUTURE) ×8 IMPLANT
SUT VIC AB 3-0 SH 27X BRD (SUTURE) ×4 IMPLANT
SUT VIC AB 3-0 SH 27XBRD (SUTURE) ×6 IMPLANT
SUT VIC AB 4-0 RB1 27XBRD (SUTURE) ×8 IMPLANT
SUT VICRYL 0 UR6 27IN ABS (SUTURE) ×2 IMPLANT
SUT VLOC BARB 180 ABS3/0GR12 (SUTURE) ×4
SUTURE VLOC BRB 180 ABS3/0GR12 (SUTURE) ×2 IMPLANT
SYR 27GX1/2 1ML LL SAFETY (SYRINGE) ×2 IMPLANT
SYR CONTROL 10ML LL (SYRINGE) ×2 IMPLANT
SYS KII OPTICAL ACCESS 15MM (TROCAR) ×2
SYSTEM KII OPTICAL ACCESS 15MM (TROCAR) ×2 IMPLANT
SYSTEM UROSTOMY GENTLE TOUCH (WOUND CARE) ×2 IMPLANT
TUBING CONNECTING 10 (TUBING) ×2 IMPLANT
WATER STERILE IRR 1000ML POUR (IV SOLUTION) ×2 IMPLANT
WATER STERILE IRR 3000ML UROMA (IV SOLUTION) ×2 IMPLANT

## 2023-07-07 NOTE — Progress Notes (Signed)
   07/07/23 1244  TOC Brief Assessment  Insurance and Status Reviewed  Patient has primary care physician Yes  Home environment has been reviewed Resides in single family home with spouse  Prior level of function: Independent at baseline  Prior/Current Home Services No current home services  Social Drivers of Health Review SDOH reviewed no interventions necessary  Readmission risk has been reviewed Yes  Transition of care needs no transition of care needs at this time

## 2023-07-07 NOTE — Brief Op Note (Signed)
07/06/2023 - 07/07/2023  12:02 PM  PATIENT:  Ryan Dixon  68 y.o. male  PRE-OPERATIVE DIAGNOSIS:  BLADDER CANCER  POST-OPERATIVE DIAGNOSIS:  BLADDER CANCER  PROCEDURE:  Procedure(s): XI ROBOTIC ASSISTED LAPAROSCOPIC COMPLETE CYSTECT ILEAL CONDUIT, UMBILICAL HERNIA REPAIR, BILATERAL INGUINAL HERNIA REPAIR (N/A) XI ROBOTIC ASSISTED LAPAROSCOPIC RADICAL PROSTATECTOMY (N/A) PELVIC LYMPH NODE DISSECTION (N/A) CYSTOSCOPY WITH INJECTION (N/A)  SURGEON:  Surgeons and Role:    * Morrison Mcbryar, Delbert Phenix., MD - Primary  PHYSICIAN ASSISTANT:   ASSISTANTS: Harrie Foreman PA   ANESTHESIA:   local and general  EBL:  100 mL   BLOOD ADMINISTERED:none  DRAINS:  1 - JP to bulb; 2 - RLQ Urostomy to gravity    LOCAL MEDICATIONS USED:  MARCAINE     SPECIMEN:  1 - ureteral margins, 2 - pelvic lymph nodes, 3 - bladder + prostate  DISPOSITION OF SPECIMEN:  PATHOLOGY  COUNTS:  YES  TOURNIQUET:  * No tourniquets in log *  DICTATION: .Other Dictation: Dictation Number [id number]  PLAN OF CARE: Admit to inpatient   PATIENT DISPOSITION:  PACU - hemodynamically stable.   Delay start of Pharmacological VTE agent (>24hrs) due to surgical blood loss or risk of bleeding: yes

## 2023-07-07 NOTE — Plan of Care (Signed)

## 2023-07-07 NOTE — Progress Notes (Signed)
Received report on patient from off-going nurse, Rosita Fire, RN. RN agrees with patient's most current assessment. Patient stable at bedside report. Will continue to monitor patient for the rest of the shift for any changes.

## 2023-07-07 NOTE — Anesthesia Postprocedure Evaluation (Signed)
Anesthesia Post Note  Patient: Ryan Dixon  Procedure(s) Performed: XI ROBOTIC ASSISTED LAPAROSCOPIC COMPLETE CYSTECT ILEAL CONDUIT, UMBILICAL HERNIA REPAIR, BILATERAL INGUINAL HERNIA REPAIR (Abdomen) XI ROBOTIC ASSISTED LAPAROSCOPIC RADICAL PROSTATECTOMY (Abdomen) PELVIC LYMPH NODE DISSECTION (Abdomen) CYSTOSCOPY WITH INJECTION (Bladder)     Patient location during evaluation: PACU Anesthesia Type: General Level of consciousness: awake and alert and oriented Pain management: pain level controlled Vital Signs Assessment: post-procedure vital signs reviewed and stable Respiratory status: spontaneous breathing, nonlabored ventilation, respiratory function stable and patient connected to nasal cannula oxygen Cardiovascular status: blood pressure returned to baseline and stable Postop Assessment: no apparent nausea or vomiting Anesthetic complications: no   No notable events documented.  Last Vitals:  Vitals:   07/07/23 1300 07/07/23 1315  BP: (!) 166/87 (!) 168/79  Pulse: 85 85  Resp: 12 13  Temp:  36.5 C  SpO2: 100% 99%    Last Pain:  Vitals:   07/07/23 1315  TempSrc:   PainSc: 3                  Rimsha Trembley A.

## 2023-07-07 NOTE — Transfer of Care (Signed)
Immediate Anesthesia Transfer of Care Note  Patient: Ryan Dixon  Procedure(s) Performed: XI ROBOTIC ASSISTED LAPAROSCOPIC COMPLETE CYSTECT ILEAL CONDUIT, UMBILICAL HERNIA REPAIR, BILATERAL INGUINAL HERNIA REPAIR (Abdomen) XI ROBOTIC ASSISTED LAPAROSCOPIC RADICAL PROSTATECTOMY (Abdomen) PELVIC LYMPH NODE DISSECTION (Abdomen) CYSTOSCOPY WITH INJECTION (Bladder)  Patient Location: PACU  Anesthesia Type:General  Level of Consciousness: awake, alert , and oriented  Airway & Oxygen Therapy: Patient Spontanous Breathing and Patient connected to face mask oxygen  Post-op Assessment: Report given to RN and Post -op Vital signs reviewed and stable  Post vital signs: Reviewed and stable  Last Vitals:  Vitals Value Taken Time  BP 170/92   Temp    Pulse 95 07/07/23 1215  Resp 12 07/07/23 1215  SpO2 100 % 07/07/23 1215  Vitals shown include unfiled device data.  Last Pain:  Vitals:   07/07/23 0609  TempSrc: Oral  PainSc:          Complications: No notable events documented.

## 2023-07-07 NOTE — Anesthesia Procedure Notes (Signed)
Procedure Name: Intubation Date/Time: 07/07/2023 8:10 AM  Performed by: Floydene Flock, CRNAPre-anesthesia Checklist: Patient identified, Emergency Drugs available, Suction available, Patient being monitored and Timeout performed Patient Re-evaluated:Patient Re-evaluated prior to induction Oxygen Delivery Method: Circle system utilized Preoxygenation: Pre-oxygenation with 100% oxygen Induction Type: IV induction Ventilation: Mask ventilation without difficulty Laryngoscope Size: Mac and 3 Grade View: Grade I Tube type: Oral Tube size: 7.5 mm Number of attempts: 1 Airway Equipment and Method: Stylet Placement Confirmation: ETT inserted through vocal cords under direct vision, positive ETCO2 and breath sounds checked- equal and bilateral Secured at: 22 cm Tube secured with: Tape Dental Injury: Teeth and Oropharynx as per pre-operative assessment

## 2023-07-07 NOTE — Op Note (Unsigned)
NAME: Ryan Dixon, [facility] MEDICAL RECORD NO: [id number] ACCOUNT NO: [id number] DATE OF BIRTH: 03-31-1956 FACILITY: [facility] LOCATION: [id number] PHYSICIAN: Sebastian Ache, MD  Operative Report   DATE OF PROCEDURE: 07/07/2023  PREOPERATIVE DIAGNOSES:  Muscle invasive bladder cancer.  PROCEDURE PERFORMED: 1. Cystoscopy with injection of indocyanine green dye. 2. Robotic-assisted laparoscopic radical cystoprostatectomy with bilateral pelvic lymph node dissection, ileal conduit urinary diversion. 3.  Bilateral inguinal hernia repair. 4.  Umbilical hernia repair.  ESTIMATED BLOOD LOSS:  100 mL.  COMPLICATIONS:  None.  SPECIMENS: 1.  Bilateral distal ureteral margins, frozen section negative. 2.  Bilateral final distal ureteral margins. 3.  Left external iliac lymph nodes. 4.  Left obturator lymph nodes. 5.  Left common iliac lymph nodes. 6.  Right common iliac lymph nodes. 7.  Right external iliac lymph nodes. 8.  Right obturator lymph nodes. 9.  Bladder plus prostate en bloc.  ASSISTANT:  Harrie Foreman, PA-C  DRAINS: 1.  Jackson-Pratt drain to bulb suction. 2.  Foley catheter per urethra to straight drain.  INDICATION:  The patient is a pleasant 68 year old man with extensive smoking history.  He is found to have muscle invasive bladder cancer by one of our colleagues in [place].  He underwent transurethral resection at initial staging, which was  unremarkable for any distant disease.  He elected curative path with neoadjuvant chemotherapy followed by cystoprostatectomy.  He underwent neoadjuvant chemotherapy under the care of our medical oncology friends in [place] and he tolerated this quite  well.  Restaging imaging also remained without any distant disease.  He was referred for continuation of curative path with cystectomy.  Repeat staging imaging labs were reviewed.  He is found to be a suitable candidate.  He is also noted on imaging to  have bilateral inguinal hernias as well as a  small umbilical hernia without a history of strangulation.  However, I discussed with him that current repair would likely be warranted as future repairs would be potentially very problematic after major  abdominal surgery. Informed consent was obtained and placed in medical record.  He was admitted yesterday for bowel prep, sterile marking, and labs, which were all favorable.  DESCRIPTION OF PROCEDURE:  The patient has been verified and identified, procedure being cystoscopy with Indocyanine green dye injection, robotic-assisted prostatectomy, lymph node dissection, urinary diversion, possible inguinal hernia repair, umbilical  hernia repair was confirmed.  Procedure timeout was performed.  Intravenous antibiotics was administered.  General endotracheal anesthesia induced.  The patient was placed into a low lithotomy position.  Sterile field was created, prepped and draped the  patient's penis, perineum, and proximal thighs using iodine and his infraxiphoid abdomen using chlorhexidine gluconate after he was clipper shaven.  After he was further fashioned to the OR table using 3-inch over foam padding across supraxiphoid chest.   His arms were tucked to the side with gel rolls.  A test of steep Trendelenburg positioning was performed and found to be suitably positioned and  LAVH type drape was placed.  Cystourethroscopy was performed using 24-French resectoscope with 0-degree  lens.  Anterior and posterior urethra was unremarkable. Inspection of the urinary bladder revealed some very subtle papillary changes in the area of the bladder neck and likely old resection site in the intertrigo area.  No obvious large tumor was seen  whatsoever.  It was quite favorable.  Next, 2 mL of Indocyanine green dye was injected across the submucosal mucosal blebs in the area of the intertrigone area and prior to resection site  and the cystoscope was exchanged for a new silicone Foley catheter  per urethra to straight drain.   Next, a high flow low pressure pneumoperitoneum was obtained in Veress technique in the supraumbilical midline having passed the aspiration and drop test and after reducing his umbilical hernia of any fatty contents.  An  8 mm robotic camera port was then placed without any complication.  Laparoscopic examination of peritoneal cavity revealed no significant adhesions.  No visceral injury.  Additional ports were placed as follows: Right paramedian 8 mm robotic port, right  far lateral 12-mm AirSeal assist port, right paramedian 15-mm assist port at the previously marked stomal site, left paramedian 8-mm robotic port, left far lateral 8-mm robotic port. Robot was docked and passed the electronic checks.  Attention was  directed at the left retroperitoneal dissection. Incision was made lateral to the descending colon in the posterior peritoneum from the area of the iliac vessel superiorly for a distance of approximately 16 cm and then distally coursing lateral to the  left medial umbilical ligament towards the area of the anterior abdominal wall.  This created a large retroperitoneal flap, which was retracted medially allowing access to the left retroperitoneum.  The ureter was encountered coursing the iliac vessels  marked with the vessel loop dissected proximally to the gonadal crossing and then distally to the ureterovesical junction were doubly clipped and ligated with a proximal clip containing a dye tag suture.  Frozen section negative for carcinoma.  Left  ureter stuck out of true pelvis.  This dissection exposed very well the left-sided pelvic lymph node fields.  Sentinel lymph angiography revealed no evidence of sentinel lymph node on the left side.  As such standard template lymphnode dissection was  performed. The first left external iliac group with boundaries being the left external iliac artery, vein, pelvic side wall, iliac bifurcation.  Lymphostasis achieved with cold clips, set aside as the  external iliac lymph nodes.  Next, the left obturator  group was dissected free with boundaries being left external iliac vein, pelvic side wall, obturator nerve. Lymphostasis with achieved with cold clips.  Set aside as the left obturator lymph nodes.  Initial fibrofatty tissue was dissected over the  common iliac and internal iliac respectively.  These were separately set aside.  The obturator nerve was inspected and following maneuvers and found to be uninjured.  No further exposure of the retroperitoneum, was noted to have a significant left  inguinal hernia indirect type hernia sac.  There was approximately 3 cm defect.  Attention was directed to the right side. Incision was made lateral to the ascending colon and near the cecum towards the area of the diaphragmatic root and then  distally coursing lateral to right median umbilical ligament towards the anterior abdominal wall.  This created a large right-sided retroperitoneal flap.  The area at the ileocecal junction was identified and verified by the presence of the appendix.  I  dissected proximally by about 14 cm to an area of distal ilium that appeared to have suitable vascularity and mobility for conduit formation.  This was then noted using tagged silk clip on the epiploic fat.  Another clip distal to this did not pass  distal orientation.  Attention was directed to the right ureteral dissection.  The right retroperitoneal flap was retracted medially and the right ureter was encountered as it coursing the iliac vessels, marked vessel loops, dissected proximally to the  gonadal crossing and then distally to the ureterovesical junction was  double clipped and ligated with a proximal clip containing a White Tag suture.  Frozen section negative for adenocarcinoma of the right ureter tucked to true pelvis.  A Y-shaped  extension of the right-sided retroperitoneal flap was then created coursing along the iliac vessel towards the area of the aortic  bifurcation.  This very well exposed the right-sided pelvic lymph nodes.  Sentinel lymph angiography did reveal several  sentinel lymphatic channels on the right side; however, these terminated in just a very small right paravesical lymph node, which was then set aside for permanent pathology.  Standard template lymphostasis was performed on the right side of the right  external iliac group and right obturator group respectively as again there was a paucity of tissue over the common iliac vessels.  The dissection allowed excellent visualization of the aortic bifurcation and an intraperitoneal tunnel was created just  anterior to this from the right side to the left side and the left ureter was brought through the tunnel to the right side thus retroperitonealized it and the right ureter, left ureter, terminal ileum tag sutures were placed into a Hemovac clip for later  conduit formation.  Attention was then directed to posterior dissection.  A posterior peritoneal flap was created with an inverted U-shape behind the area of the bladder and the plane behind the vas and seminal vesicals was entered and carried all the way to the  apex of the prostate, which was noted by anterior curvature.  The anterior rectal wall was somewhat adherent to the posterior plane; however, the Dennonviller's fascia was still intact and the rectal wall was carefully dissected away posteriorly and this was still  quite safe.  This was closed with the vascular pedicles which were drilled using white load stapler x2 to each side this resulted in excellent control of the bladder and prostate pedicles.  Anterior dissection was then performed by developing the space  of Retzius in the medial umbilical ligaments towards the area of the dorsal venous complex.  It was controlled using a Green load stapler.  I exposed the membranous urethra, which was coldly transected.  The in situ Foley catheter was double clipped and  ligated using a  bucket handle and the bladder plus prostate en bloc specimen was then placed in the Endocatch for later retrieval.  The membranous urethral stump was oversewn using running V-Loc.  Distal rectal exam was then performed using an indicator  glove.  No evidence of rectal violation was noted.  We achieved the goals of extirpated portion of the procedure today.  Again, he did have significant bilateral indirect type inguinal hernias, fascial defect approximately 3 cm on the left, approximately  2 cm on the right.  It was felt that simple repair would be warranted to prevent future attempts at repair following his abdominal surgery.  As such, simple figure-of-eight was performed x3 on the left x2 on the right, closing hernia defect,  reapproximating it to the pectinate line of the pubic ramus.  This completely obliterated the inner aspect of the inguinal hernias.  Closed suction drain throughout the uterus.  The left lateral most robotic port site in the peritoneal cavity, the right  ureter, left ureter, terminal ileum tag sutures were grasped with a liposuction grasper via the right 15-mm port site.  Specimen bag was shifted to the left side of the abdomen and the bag string was brought through the left paramedian robotic port site.  Robot was then undocked.  The specimen was  retrieved by the previous camera port site inferiorly airing to the left side of the umbilicus.  This was approximately 7 cm and the bladder plus prostate specimen was brought out and set aside for permanent  pathology.  A wound-protector type retractor was then placed through this.  The right ureter, terminal ileum, left ureter tag sutures were brought through this.  The structures were separately marked with Babcock forceps.  Attention was directed at  chondral formation.  Previously noted chondral site was identified and a segment of 14 cm in length was taken out of continuity using green load stapler proximal and distal.  Mesentery was  developed using 1.5 loads of white load stapler distal, one load  proximal. Exquisite care to avoid revascularization of the conduit anastomotic segments.  The conduit was in line to retroperitoneal orientation.  Bowel-bowel anastomosis was performed using 1.5 loads of the Green load stapler on the antimesenteric  border.  The free end was oversewn using a running silk with a separate interrupting layer of running silk.  The acute angle was bolstered with interrupted silk.  The mesenteric defect was reapproximated using interrupted silk.  The bowel-bowel  anastomosis was visibly viable and palpably patent as we delivered the abdominal cavity.  Attention was then directed at conduit formation.  The distal staple line was removed.  The proximal staple line was excluded using running Vicryl and  ureteroenteric anastomosis was performed first to the right side by incising approximately 4 mm of the proximal conduit in a V-shape fashion.  Four mucosal everting sutures were placed.  An ureteroenteric anastomosis was performed over a red-colored  Bander stent to 20 cm anastomosis.  A chromic suture was placed through and through the stent in the midline of the conduit to prevent inadvertent durable displacement.  A mirror image ureteroenteric anastomosis was performed on the right side using a  blue-colored bander stent and 26 mm anastomosis.  The geometry and patency of these were quite favorable.  Next, a quarter size diameter column of skin and subcutaneous tissue was excised at the previously marked stomal site at the level of the fascia,  which was dilated to accommodate two surgeons fingers.  Four fascial anchoring sutures were applied in a quadrant fashion.  A Vicryl cut was brought through this and anchored in a quadrant fashion.  The same sutures were used in a rose budding fashion.   The abdomen was once again inspected at the extraction site.  Hemostasis was excellent.  Sponge and needle counts were  correct.  The small umbilical hernia defect was then noted.  It was only approximately 1.5 cm in diameter.  The fascial edges of this  were developed and the fracture site was closed using interrupted Novafil x approximately 12 excluding the area of umbilical hernia.  This performing over the umbilical hernia repair.  All incision sites were infiltrated with dilute lipolyzed Marcaine  and closed at the level of the skin using subcuticular Monocryl followed by Dermabond.  After additional Vicryl was placed in the quadrant fashion for maturation of the stoma.  Ostomy appliance were placed.  Procedure was then terminated.  The patient  tolerated the procedure well.  No immediate periprocedural complications.  The patient was taken to postanesthesia care unit in stable condition.  Plan for observation admission.  Please note, first assistant, Harrie Foreman, was crucial for all portions of surgery today.  She provided invaluable retraction, suctioning, vascular stapling, vascular clipping, robotic instrument exchange, and general first assistance.  PUS D: 07/07/2023 12:17:29 pm T: 07/07/2023 1:55:00 pm  JOB: [id number]/ [id number]

## 2023-07-07 NOTE — H&P (Signed)
Ryan Dixon is an 68 y.o. male.    Chief Complaint: Pre-Op Cystoprostatectomy / Urinary Diversion  HPI:   1 - Muscle Invasive Bladder Cancer - T2G3 bladder cancer at TURBT 11/2022 by Dr. Lonna Dixon. CT localized, appears single ureters bilaterally. Long term smoker. Cr 1.2. S/p 4 cycles MVAC neo-adjuvant chemo under care of Ryan Barter MD with Ryan Dixon. Restaging imaging 04/2023 w/o metastatic disease.   2 - Umbilical and L>R Inguinal Hernias - fat-containing umbilical and bilateral inguinal hernias on CT 2024. Would consider repair at future cystectomy to prevent future bowel complications.   PMH sig for >80PY smoker/still smokes (No O2, not limiting), PAD (asymptomatic), TNA. NO chest/abd surgeries. No blood thinners. 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 His wife Ryan Dixon is very involved. His PCP is Ryan Dixon with [employer].   Today " Ryan Dixon " is seen to proceed with cystoprostatectomy. HE completed bowel prep to clear last PM. Cr 1.5, Hgb 10, T+C 2 units performed.   Past Medical History:  Diagnosis Date   Aortic atherosclerosis (HCC)    Chronic foot pain, left    History of hematuria    Hypertension    Other emphysema (HCC)    Other male erectile dysfunction    Pre-diabetes    Pure hypercholesterolemia    Tobacco dependence     Past Surgical History:  Procedure Laterality Date   ANKLE ARTHROSCOPY Left 08/11/2020   Procedure: LEFT ANKLE ARTHROSCOPY AND DEBRIDEMENT;  Surgeon: Ryan Mustard, MD;  Location: [facility];  Service: Orthopedics;  Laterality: Left;   BLADDER INSTILLATION N/A 11/29/2022   Procedure: BLADDER INSTILLATION OF GEMCITABINE;  Surgeon: Ryan Altes, MD;  Location: [facility];  Service: Urology;  Laterality: N/A;   COLONOSCOPY     IR IMAGING GUIDED PORT INSERTION  01/20/2023   TONSILLECTOMY     TRANSURETHRAL RESECTION OF BLADDER TUMOR N/A 11/29/2022    Procedure: TRANSURETHRAL RESECTION OF BLADDER TUMOR (TURBT);  Surgeon: Ryan Altes, MD;  Location: [facility];  Service: Urology;  Laterality: N/A;    History reviewed. No pertinent family history. Social History:  reports that he has been smoking cigarettes. He has a 86 pack-year smoking history. He has never used smokeless tobacco. He reports current alcohol use. He reports that he does not use drugs.  Allergies:  Allergies  Allergen Reactions   Nsaids Other (See Comments)    GI upset   Other Other (See Comments)    Anti-inflammatories cause GI upset, N/V    Medications Prior to Admission  Medication Sig Dispense Refill   atorvastatin (LIPITOR) 20 MG tablet TAKE 1 TABLET BY MOUTH EVERY DAY AT NIGHT     hydrochlorothiazide (HYDRODIURIL) 25 MG tablet Take 25 mg by mouth daily.     varenicline (CHANTIX) 1 MG tablet Take 1 mg by mouth 2 (two) times daily.     Wheat Dextrin (BENEFIBER) POWD Take 0.5 Scoops by mouth daily.      Results for orders placed or performed during the hospital encounter of 07/06/23 (from the past 48 hours)  Surgical PCR screen     Status: None   Collection Time: 07/06/23  6:18 PM   Specimen: Nasal Mucosa; Nasal Swab  Result Value Ref Range   MRSA, PCR NEGATIVE NEGATIVE   Staphylococcus aureus NEGATIVE NEGATIVE    Comment: (NOTE) The Xpert SA Assay (FDA approved for NASAL specimens in patients 69 years of age and older), is one  component of a comprehensive surveillance program. It is not intended to diagnose infection nor to guide or monitor treatment. Performed at [facility], [address], [place], [place] [address]   Comprehensive metabolic panel     Status: Abnormal   Collection Time: 07/06/23 11:03 PM  Result Value Ref Range   Sodium 135 135 - 145 mmol/L   Potassium 4.0 3.5 - 5.1 mmol/L   Chloride 104 98 - 111 mmol/L   CO2 22 22 - 32 mmol/L   Glucose, Bld 96 70 - 99 mg/dL    Comment: Glucose reference range applies only  to samples taken after fasting for at least 8 hours.   BUN 27 (H) 8 - 23 mg/dL   Creatinine, Ser 6.04 (H) 0.61 - 1.24 mg/dL   Calcium 8.7 (L) 8.9 - 10.3 mg/dL   Total Protein 6.5 6.5 - 8.1 g/dL   Albumin 3.8 3.5 - 5.0 g/dL   AST 20 15 - 41 U/L   ALT 18 0 - 44 U/L   Alkaline Phosphatase 70 38 - 126 U/L   Total Bilirubin 0.4 0.0 - 1.2 mg/dL   GFR, Estimated 50 (L) >60 mL/min    Comment: (NOTE) Calculated using the CKD-EPI Creatinine Equation (2021)    Anion gap 9 5 - 15    Comment: Performed at [facility], [address] [place], [place] [address]  CBC     Status: Abnormal   Collection Time: 07/06/23 11:03 PM  Result Value Ref Range   WBC 8.1 4.0 - 10.5 K/uL   RBC 3.05 (L) 4.22 - 5.81 MIL/uL   Hemoglobin 10.3 (L) 13.0 - 17.0 g/dL   HCT 11.9 (L) 14.7 - 82.9 %   MCV 99.7 80.0 - 100.0 fL   MCH 33.8 26.0 - 34.0 pg   MCHC 33.9 30.0 - 36.0 g/dL   RDW 56.2 13.0 - 86.5 %   Platelets 293 150 - 400 K/uL   nRBC 0.0 0.0 - 0.2 %    Comment: Performed at [facility], [address], [place], [place] [address]   No results found.  Review of Systems  Constitutional:  Negative for chills and fever.  All other systems reviewed and are negative.   Blood pressure 135/78, pulse 81, temperature 98.1 F (36.7 C), temperature source Oral, resp. rate 16, height 5\' 10"  (1.778 m), weight 68.9 kg, SpO2 97%. Physical Exam Vitals reviewed.  HENT:     Head: Normocephalic.  Eyes:     Pupils: Pupils are equal, round, and reactive to light.  Cardiovascular:     Rate and Rhythm: Normal rate.  Abdominal:     General: Abdomen is flat.     Comments: Osotmy matking stie noted  Genitourinary:    Comments: No CVAT at present Musculoskeletal:        General: Normal range of motion.     Cervical back: Normal range of motion.  Skin:    General: Skin is warm.  Neurological:     General: No focal deficit present.     Mental Status: He is alert.  Psychiatric:         Mood and Affect: Mood normal.      Assessment/Plan  Proceed as planned with cysto/ICG, robotic cystoprostatectomy + node dissection + conduit diversion, low threshold for concomitatn umbilcal and inguinal hernia repair pending intra-op anatomy. Risks, benefits, alternatives, expected peri-op course discussed previously over several visits and reiterated today. He understands that his pulm comorbidity  increases risk.   [name]., MD 07/07/2023, 6:53 AM

## 2023-07-07 NOTE — Progress Notes (Signed)
*   Day of Surgery *   Subjective/Chief Complaint:  1 - Bladder Cancer / Inguinal + Umbilical Hernias - s/p robotic cystoprostatectomy + node dissection + conduit diversion + bilateral inguinal + umbilical hernia repair 07/07/23. Path pending. Admitted 1/23 for bowel prep and stomal marking.  2 - Ileus - bowel anastomosis as part of urinary diversion. Received entereg peri-op. NPO initially post-op.  3 - Disposition / Rehab - independent in all ADL's at baseline. PT eval pending.  Today "Antwone" is doing well after major surgery. Pain controlled, no emesis.   Objective: Vital signs in last 24 hours: Temp:  [97.7 F (36.5 C)-98.6 F (37 C)] 98.1 F (36.7 C) (01/24 1344) Pulse Rate:  [71-96] 89 (01/24 1344) Resp:  [11-18] 18 (01/24 1344) BP: (100-177)/(64-92) 177/82 (01/24 1344) SpO2:  [97 %-100 %] 99 % (01/24 1344) Weight:  [68.9 kg-69.9 kg] 68.9 kg (01/24 0604) Last BM Date : 07/06/23  Intake/Output from previous day: 01/23 0701 - 01/24 0700 In: 324.7 [I.V.:324.7] Out: -  Intake/Output this shift: Total I/O In: 2293.5 [I.V.:1743.5; IV Piggyback:550] Out: 420 [Urine:100; Drains:220; Blood:100]  NAD, AOx3, family at bedside Non-labored breathign on [place] O2 RRR SNTND. RLQ Urostomy pink and patent of non-foul tea colored urine. Rt (red), Lt (blue) bander stents. Recent port and extraction sites c/d/ei. JP with non-foul serosanguinous fluid. SCD's in place  Lab Results:  Recent Labs    07/06/23 2303 07/07/23 1313  WBC 8.1  --   HGB 10.3* 10.2*  HCT 30.4* 31.0*  PLT 293  --    BMET Recent Labs    07/06/23 2303  NA 135  K 4.0  CL 104  CO2 22  GLUCOSE 96  BUN 27*  CREATININE 1.54*  CALCIUM 8.7*   PT/INR No results for input(s): "LABPROT", "INR" in the last 72 hours. ABG No results for input(s): "PHART", "HCO3" in the last 72 hours.  Invalid input(s): "PCO2", "PO2"  Studies/Results: No results found.  Anti-infectives: Anti-infectives (From admission, onward)     Start     Dose/Rate Route Frequency Ordered Stop   07/07/23 1530  piperacillin-tazobactam (ZOSYN) IVPB 3.375 g        3.375 g 12.5 mL/hr over 240 Minutes Intravenous Every 8 hours 07/07/23 1400 07/08/23 1529   07/07/23 1445  piperacillin-tazobactam (ZOSYN) IVPB 3.375 g  Status:  Discontinued        3.375 g 100 mL/hr over 30 Minutes Intravenous Every 8 hours 07/07/23 1356 07/07/23 1400   07/07/23 0600  piperacillin-tazobactam (ZOSYN) IVPB 3.375 g        3.375 g 100 mL/hr over 30 Minutes Intravenous  Once 07/06/23 1647 07/07/23 0750   07/06/23 1745  neomycin (MYCIFRADIN) tablet 500 mg        500 mg Oral Every 4 hours 07/06/23 1649 07/06/23 2140   07/06/23 1745  metroNIDAZOLE (FLAGYL) tablet 500 mg        500 mg Oral Every 4 hours 07/06/23 1649 07/06/23 2140       Assessment/Plan:  Doing well POD 0. NPO. Non-urgent PT eval. Goals of hospitalization discussed.    [name]. 07/07/2023

## 2023-07-07 NOTE — Discharge Instructions (Signed)

## 2023-07-08 ENCOUNTER — Encounter (HOSPITAL_COMMUNITY): Payer: Self-pay | Admitting: Urology

## 2023-07-08 LAB — BASIC METABOLIC PANEL
Anion gap: 9 (ref 5–15)
BUN: 26 mg/dL — ABNORMAL HIGH (ref 8–23)
CO2: 23 mmol/L (ref 22–32)
Calcium: 8.4 mg/dL — ABNORMAL LOW (ref 8.9–10.3)
Chloride: 106 mmol/L (ref 98–111)
Creatinine, Ser: 1.82 mg/dL — ABNORMAL HIGH (ref 0.61–1.24)
GFR, Estimated: 41 mL/min — ABNORMAL LOW (ref >60)
Glucose, Bld: 117 mg/dL — ABNORMAL HIGH (ref 70–99)
Potassium: 4.6 mmol/L (ref 3.5–5.1)
Sodium: 138 mmol/L (ref 135–145)

## 2023-07-08 LAB — HEMOGLOBIN AND HEMATOCRIT, BLOOD
HCT: 25.7 % — ABNORMAL LOW (ref 39.0–52.0)
Hemoglobin: 8.5 g/dL — ABNORMAL LOW (ref 13.0–17.0)

## 2023-07-08 NOTE — Progress Notes (Signed)
1 Day Post-Op Subjective: Patient reports mild incisional pain. Ambulating in halls with assistance. Negative flatus  Objective: Vital signs in last 24 hours: Temp:  [98 F (36.7 C)-98.7 F (37.1 C)] 98.5 F (36.9 C) (01/25 1249) Pulse Rate:  [82-95] 95 (01/25 1249) Resp:  [18] 18 (01/25 1249) BP: (126-147)/(66-80) 147/80 (01/25 1249) SpO2:  [99 %-100 %] 100 % (01/25 1249)  Intake/Output from previous day: 01/24 0701 - 01/25 0700 In: 3582.8 [I.V.:2714.8; IV Piggyback:868.1] Out: 2005 [Urine:1300; Drains:605; Blood:100] Intake/Output this shift: Total I/O In: 855.9 [I.V.:623.6; IV Piggyback:232.3] Out: 1187 [Urine:900; Drains:287]  Physical Exam:  General:alert, cooperative, and appears stated age GI: soft, non tender, normal bowel sounds, no palpable masses, no organomegaly, no inguinal hernia Male genitalia: not done Extremities: extremities normal, atraumatic, no cyanosis or edema  Lab Results: Recent Labs    07/06/23 2303 07/07/23 1313 07/08/23 0500  HGB 10.3* 10.2* 8.5*  HCT 30.4* 31.0* 25.7*   BMET Recent Labs    07/06/23 2303 07/08/23 0500  NA 135 138  K 4.0 4.6  CL 104 106  CO2 22 23  GLUCOSE 96 117*  BUN 27* 26*  CREATININE 1.54* 1.82*  CALCIUM 8.7* 8.4*   No results for input(s): "LABPT", "INR" in the last 72 hours. No results for input(s): "LABURIN" in the last 72 hours. Results for orders placed or performed during the hospital encounter of 07/06/23  Surgical PCR screen     Status: None   Collection Time: 07/06/23  6:18 PM   Specimen: Nasal Mucosa; Nasal Swab  Result Value Ref Range Status   MRSA, PCR NEGATIVE NEGATIVE Final   Staphylococcus aureus NEGATIVE NEGATIVE Final    Comment: (NOTE) The Xpert SA Assay (FDA approved for NASAL specimens in patients 35 years of age and older), is one component of a comprehensive surveillance program. It is not intended to diagnose infection nor to guide or monitor treatment. Performed at [facility], [address], [place], [place] [address]     Studies/Results: No results found.  Assessment/Plan: POD#1 radical cystectomy Advance diet to ice chips Ambulate in halls with assistance Continue current pain control regiment   LOS: 2 days   Wilkie Aye 07/08/2023, 6:13 PM

## 2023-07-08 NOTE — Progress Notes (Signed)
1 Day Post-Op   Subjective/Chief Complaint:   1 - Bladder Cancer / Inguinal + Umbilical Hernias - s/p robotic cystoprostatectomy + node dissection + conduit diversion + bilateral inguinal + umbilical hernia repair 07/07/23. Path pending. Admitted 1/23 for bowel prep and stomal marking.  2 - Ileus - bowel anastomosis as part of urinary diversion. Received entereg peri-op. NPO initially post-op.  3 - Disposition / Rehab - independent in all ADL's at baseline. PT eval pending.  Today "Ryan Dixon" is  stable Cr up some 1.8, and Hgb 8s (some drift). No emesis.   Objective: Vital signs in last 24 hours: Temp:  [97.7 F (36.5 C)-98.8 F (37.1 C)] 98.7 F (37.1 C) (01/25 0356) Pulse Rate:  [82-94] 82 (01/25 0356) Resp:  [11-18] 18 (01/25 0356) BP: (126-177)/(66-92) 136/67 (01/25 0356) SpO2:  [99 %-100 %] 99 % (01/25 0356) Last BM Date : 07/06/23  Intake/Output from previous day: 01/24 0701 - 01/25 0700 In: 3582.8 [I.V.:2714.8; IV Piggyback:868.1] Out: 2005 [Urine:1300; Drains:605; Blood:100] Intake/Output this shift: No intake/output data recorded.    NAD, AOx3, family at bedside Non-labored breathign on [place] O2 RRR SNTND. RLQ Urostomy pink and patent of non-foul tea colored urine. Rt (red), Lt (blue) bander stents. Recent port and extraction sites c/d/ei. JP with non-foul serosanguinous fluid. SCD's in place  Lab Results:  Recent Labs    07/06/23 2303 07/07/23 1313 07/08/23 0500  WBC 8.1  --   --   HGB 10.3* 10.2* 8.5*  HCT 30.4* 31.0* 25.7*  PLT 293  --   --    BMET Recent Labs    07/06/23 2303 07/08/23 0500  NA 135 138  K 4.0 4.6  CL 104 106  CO2 22 23  GLUCOSE 96 117*  BUN 27* 26*  CREATININE 1.54* 1.82*  CALCIUM 8.7* 8.4*   PT/INR No results for input(s): "LABPROT", "INR" in the last 72 hours. ABG No results for input(s): "PHART", "HCO3" in the last 72 hours.  Invalid input(s): "PCO2", "PO2"  Studies/Results: No results  found.  Anti-infectives: Anti-infectives (From admission, onward)    Start     Dose/Rate Route Frequency Ordered Stop   07/07/23 1530  piperacillin-tazobactam (ZOSYN) IVPB 3.375 g        3.375 g 12.5 mL/hr over 240 Minutes Intravenous Every 8 hours 07/07/23 1400 07/08/23 1529   07/07/23 1445  piperacillin-tazobactam (ZOSYN) IVPB 3.375 g  Status:  Discontinued        3.375 g 100 mL/hr over 30 Minutes Intravenous Every 8 hours 07/07/23 1356 07/07/23 1400   07/07/23 0600  piperacillin-tazobactam (ZOSYN) IVPB 3.375 g        3.375 g 100 mL/hr over 30 Minutes Intravenous  Once 07/06/23 1647 07/07/23 0750   07/06/23 1745  neomycin (MYCIFRADIN) tablet 500 mg        500 mg Oral Every 4 hours 07/06/23 1649 07/06/23 2140   07/06/23 1745  metroNIDAZOLE (FLAGYL) tablet 500 mg        500 mg Oral Every 4 hours 07/06/23 1649 07/06/23 2140       Assessment/Plan:  Ambulate, continue daily labs, consider pRBC for sig Hgb trend or symptomatic aneima, Suspcet equilibration at this point. Continue ice chips.    Ryan Dixon. 07/08/2023

## 2023-07-08 NOTE — Plan of Care (Signed)

## 2023-07-08 NOTE — Evaluation (Signed)
Physical Therapy Evaluation Patient Details Name: Ryan Dixon MRN: [id number] DOB: 30-Jan-1956 Today's Date: 07/08/2023  History of Present Illness  Pt is a 68yo male presenting s/p cystoprostatectomy and multiple hernia repairs 1/24 in the setting of bladder cancer.   PMH: HTN, Emphysema, bladder cancer, PAD,  Clinical Impression  Pt received supine in bed agreeable to be seen, reporting 6/10 pain. Educated pt on splinting during mobility/coughing/sneezing/laughing, pt demonstrating technique throughout session. Educated pt on  logroll and sidelying to sit, visual demonstration provided, pt guided through technique with CGA. Pt guided through STS from elevated surface to RW with CGA, ambulated with RW and CGA for 263ft, no overt LOB noted nor physical assist required. Instructed pt and family on walking program at least 4x/daily with staff and updated whiteboard with tally-marks, informed nursing of goal and added pt to mobility specialist caseload as well. We will continue to follow acutely to assist with additional mobility and training during acute care stay to promote safe discharge home in the care of family.      If plan is discharge home, recommend the following: A little help with walking and/or transfers;A little help with bathing/dressing/bathroom;Assist for transportation;Help with stairs or ramp for entrance   Can travel by private vehicle        Equipment Recommendations None recommended by PT (Pt has DME)  Recommendations for Other Services       Functional Status Assessment Patient has had a recent decline in their functional status and demonstrates the ability to make significant improvements in function in a reasonable and predictable amount of time.     Precautions / Restrictions Precautions Precautions: Other (comment) Precaution Comments: 2 drains Restrictions Weight Bearing Restrictions Per Provider Order: No      Mobility  Bed Mobility Overal bed mobility:  Needs Assistance Bed Mobility: Rolling, Sidelying to Sit Rolling: Contact guard assist Sidelying to sit: Contact guard assist       General bed mobility comments: Cues for sidelying to sit and rolling with demonstration, able to perform without physical assistance    Transfers Overall transfer level: Needs assistance Equipment used: Rolling walker (2 wheels) Transfers: Sit to/from Stand Sit to Stand: Contact guard assist, From elevated surface           General transfer comment: Cues for form, CGA for safety    Ambulation/Gait Ambulation/Gait assistance: Contact guard assist Gait Distance (Feet): 250 Feet Assistive device: Rolling walker (2 wheels) Gait Pattern/deviations: Step-through pattern, Decreased step length - right, Decreased step length - left Gait velocity: decreased     General Gait Details: Pt ambulated with RW and CGA, cues for upright posture and reduced grip on RW handgrips, distance limited by fatigue and pain, seated recovery period provided.  Stairs            Wheelchair Mobility     Tilt Bed    Modified Rankin (Stroke Patients Only)       Balance Overall balance assessment: Needs assistance Sitting-balance support: Feet supported, No upper extremity supported Sitting balance-Leahy Scale: Good     Standing balance support: Reliant on assistive device for balance, During functional activity, Bilateral upper extremity supported Standing balance-Leahy Scale: Poor                               Pertinent Vitals/Pain Pain Assessment Pain Assessment: 0-10 Pain Score: 6  Pain Location: middle abdomen Pain Descriptors / Indicators: Operative site guarding Pain  Intervention(s): Limited activity within patient's tolerance, Monitored during session, Repositioned    Home Living Family/patient expects to be discharged to:: Private residence Living Arrangements: Spouse/significant other Available Help at Discharge:  Family;Available 24 hours/day Type of Home: House Home Access: Stairs to enter Entrance Stairs-Rails: None Entrance Stairs-Number of Steps: 3   Home Layout: One level Home Equipment: Agricultural consultant (2 wheels);Cane - single point;Shower seat      Prior Function Prior Level of Function : Independent/Modified Independent;Driving;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)             Mobility Comments: IND ADLs Comments: IND     Extremity/Trunk Assessment   Upper Extremity Assessment Upper Extremity Assessment: Overall WFL for tasks assessed    Lower Extremity Assessment Lower Extremity Assessment: Overall WFL for tasks assessed    Cervical / Trunk Assessment Cervical / Trunk Assessment: Normal  Communication   Communication Communication: No apparent difficulties  Cognition Arousal: Alert Behavior During Therapy: WFL for tasks assessed/performed Overall Cognitive Status: Within Functional Limits for tasks assessed                                          General Comments General comments (skin integrity, edema, etc.): Wife present    Exercises General Exercises - Lower Extremity Ankle Circles/Pumps: AROM, Both, 20 reps, Seated   Assessment/Plan    PT Assessment Patient needs continued PT services  PT Problem List Decreased activity tolerance;Decreased mobility;Decreased knowledge of use of DME       PT Treatment Interventions DME instruction;Gait training;Stair training;Functional mobility training;Therapeutic activities;Therapeutic exercise;Balance training;Neuromuscular re-education;Patient/family education    PT Goals (Current goals can be found in the Care Plan section)  Acute Rehab PT Goals Patient Stated Goal: To go home PT Goal Formulation: With patient Time For Goal Achievement: 07/22/23 Potential to Achieve Goals: Good    Frequency Min 1X/week     Co-evaluation               AM-PAC PT "6 Clicks" Mobility  Outcome Measure Help  needed turning from your back to your side while in a flat bed without using bedrails?: A Little Help needed moving from lying on your back to sitting on the side of a flat bed without using bedrails?: A Little Help needed moving to and from a bed to a chair (including a wheelchair)?: A Little Help needed standing up from a chair using your arms (e.g., wheelchair or bedside chair)?: A Little Help needed to walk in hospital room?: A Little Help needed climbing 3-5 steps with a railing? : A Lot 6 Click Score: 17    End of Session Equipment Utilized During Treatment: Gait belt Activity Tolerance: Patient tolerated treatment well;No increased pain Patient left: in chair;with call bell/phone within reach;with family/visitor present Nurse Communication: Mobility status PT Visit Diagnosis: Other abnormalities of gait and mobility (R26.89)    Time: 4696-2952 PT Time Calculation (min) (ACUTE ONLY): 25 min   Charges:   PT Evaluation $PT Eval Low Complexity: 1 Low PT Treatments $Gait Training: 8-22 mins PT General Charges $$ ACUTE PT VISIT: 1 Visit         Jamesetta Geralds, PT, DPT [facility] Rehabilitation Department Office: [phone]  Jamesetta Geralds 07/08/2023, 4:24 PM

## 2023-07-09 LAB — HEMOGLOBIN AND HEMATOCRIT, BLOOD
HCT: 26.4 % — ABNORMAL LOW (ref 39.0–52.0)
Hemoglobin: 8.7 g/dL — ABNORMAL LOW (ref 13.0–17.0)

## 2023-07-09 LAB — BASIC METABOLIC PANEL
Anion gap: 8 (ref 5–15)
BUN: 25 mg/dL — ABNORMAL HIGH (ref 8–23)
CO2: 24 mmol/L (ref 22–32)
Calcium: 8.6 mg/dL — ABNORMAL LOW (ref 8.9–10.3)
Chloride: 104 mmol/L (ref 98–111)
Creatinine, Ser: 1.63 mg/dL — ABNORMAL HIGH (ref 0.61–1.24)
GFR, Estimated: 46 mL/min — ABNORMAL LOW (ref >60)
Glucose, Bld: 95 mg/dL (ref 70–99)
Potassium: 4.1 mmol/L (ref 3.5–5.1)
Sodium: 136 mmol/L (ref 135–145)

## 2023-07-09 MED ORDER — CHLORHEXIDINE GLUCONATE CLOTH 2 % EX PADS
6.0000 | MEDICATED_PAD | Freq: Every day | CUTANEOUS | Status: DC
  Administered 2023-07-09 – 2023-07-12 (×4): 6 via TOPICAL

## 2023-07-09 NOTE — Progress Notes (Signed)
2 Days Post-Op Subjective: Patient reports mild incisional pain. Ambulating in halls with assistance. Negative flatus. Tolerating ice chips. Hemoglobin stable  Objective: Vital signs in last 24 hours: Temp:  [98 F (36.7 C)-98.6 F (37 C)] 98 F (36.7 C) (01/26 1229) Pulse Rate:  [96-100] 100 (01/26 1229) Resp:  [16-18] 16 (01/26 1229) BP: (130-143)/(77-84) 143/84 (01/26 1229) SpO2:  [94 %-99 %] 99 % (01/26 1229)  Intake/Output from previous day: 01/25 0701 - 01/26 0700 In: 855.9 [I.V.:623.6; IV Piggyback:232.3] Out: 1915 [Urine:1500; Drains:415] Intake/Output this shift: No intake/output data recorded.  Physical Exam:  General:alert, cooperative, and appears stated age GI: soft, non tender, normal bowel sounds, no palpable masses, no organomegaly, no inguinal hernia Male genitalia: not done Extremities: extremities normal, atraumatic, no cyanosis or edema  Lab Results: Recent Labs    07/07/23 1313 07/08/23 0500 07/09/23 0242  HGB 10.2* 8.5* 8.7*  HCT 31.0* 25.7* 26.4*   BMET Recent Labs    07/08/23 0500 07/09/23 0242  NA 138 136  K 4.6 4.1  CL 106 104  CO2 23 24  GLUCOSE 117* 95  BUN 26* 25*  CREATININE 1.82* 1.63*  CALCIUM 8.4* 8.6*   No results for input(s): "LABPT", "INR" in the last 72 hours. No results for input(s): "LABURIN" in the last 72 hours. Results for orders placed or performed during the hospital encounter of 07/06/23  Surgical PCR screen     Status: None   Collection Time: 07/06/23  6:18 PM   Specimen: Nasal Mucosa; Nasal Swab  Result Value Ref Range Status   MRSA, PCR NEGATIVE NEGATIVE Final   Staphylococcus aureus NEGATIVE NEGATIVE Final    Comment: (NOTE) The Xpert SA Assay (FDA approved for NASAL specimens in patients 8 years of age and older), is one component of a comprehensive surveillance program. It is not intended to diagnose infection nor to guide or monitor treatment. Performed at [facility], [address], [place], [place] [address]     Studies/Results: No results found.  Assessment/Plan: POD#2 radical cystectomy Advance diet to clears Ambulate in halls with assistance Continue current pain control regiment   LOS: 3 days   Wilkie Aye 07/09/2023, 7:26 PM

## 2023-07-09 NOTE — Plan of Care (Signed)
  Problem: Clinical Measurements: Goal: Diagnostic test results will improve Outcome: Progressing   Problem: Activity: Goal: Risk for activity intolerance will decrease Outcome: Progressing   Problem: Pain Managment: Goal: General experience of comfort will improve and/or be controlled Outcome: Progressing   Problem: Safety: Goal: Ability to remain free from injury will improve Outcome: Progressing

## 2023-07-09 NOTE — Progress Notes (Signed)
Physical Therapy Treatment Patient Details Name: Ryan Dixon MRN: [id number] DOB: 11/21/1955 Today's Date: 07/09/2023   History of Present Illness Pt is a 68yo male presenting s/p cystoprostatectomy and multiple hernia repairs 1/24 in the setting of bladder cancer.   PMH: HTN, Emphysema, bladder cancer, PAD,    PT Comments  Pt ambulated in hallway and has been tolerating improved distance (already ambulated once this morning with nurse tech).  Pt appears to be making good progress.  Pt encouraged to continue ambulating at least 4x/day.  No f/u PT recommended.    If plan is discharge home, recommend the following: Assist for transportation;Help with stairs or ramp for entrance   Can travel by private vehicle        Equipment Recommendations  None recommended by PT    Recommendations for Other Services       Precautions / Restrictions Precautions Precautions: Other (comment) Precaution Comments: 2 drains     Mobility  Bed Mobility Overal bed mobility: Modified Independent             General bed mobility comments: performs log roll technique    Transfers Overall transfer level: Needs assistance Equipment used: Rolling walker (2 wheels) Transfers: Sit to/from Stand Sit to Stand: Contact guard assist                Ambulation/Gait Ambulation/Gait assistance: Contact guard assist, Supervision Gait Distance (Feet): 280 Feet Assistive device: Rolling walker (2 wheels) Gait Pattern/deviations: Step-through pattern, Decreased stride length       General Gait Details: a couple cues for posture   Stairs             Wheelchair Mobility     Tilt Bed    Modified Rankin (Stroke Patients Only)       Balance                                            Cognition Arousal: Alert Behavior During Therapy: WFL for tasks assessed/performed Overall Cognitive Status: Within Functional Limits for tasks assessed                                           Exercises General Exercises - Lower Extremity Ankle Circles/Pumps: AROM, Both, 10 reps, Seated Long Arc Quad: AROM, Both, 10 reps, Seated    General Comments        Pertinent Vitals/Pain Pain Assessment Pain Assessment: 0-10 Pain Score: 3  Pain Location: middle abdomen Pain Descriptors / Indicators: Operative site guarding, Sore Pain Intervention(s): Repositioned, Monitored during session    Home Living                          Prior Function            PT Goals (current goals can now be found in the care plan section) Progress towards PT goals: Progressing toward goals    Frequency    Min 1X/week      PT Plan      Co-evaluation              AM-PAC PT "6 Clicks" Mobility   Outcome Measure  Help needed turning from your back to your side while in a flat bed without using  bedrails?: None Help needed moving from lying on your back to sitting on the side of a flat bed without using bedrails?: None Help needed moving to and from a bed to a chair (including a wheelchair)?: A Little Help needed standing up from a chair using your arms (e.g., wheelchair or bedside chair)?: A Little Help needed to walk in hospital room?: A Little Help needed climbing 3-5 steps with a railing? : A Little 6 Click Score: 20    End of Session   Activity Tolerance: Patient tolerated treatment well;No increased pain Patient left: in chair;with call bell/phone within reach;with family/visitor present   PT Visit Diagnosis: Difficulty in walking, not elsewhere classified (R26.2)     Time: 1610-9604 PT Time Calculation (min) (ACUTE ONLY): 11 min  Charges:    $Gait Training: 8-22 mins PT General Charges $$ ACUTE PT VISIT: 1 Visit          Paulino Door, DPT Physical Therapist Acute Rehabilitation Services Office: [phone]    [name] Payson 07/09/2023, 3:25 PM

## 2023-07-10 LAB — BASIC METABOLIC PANEL
Anion gap: 10 (ref 5–15)
BUN: 26 mg/dL — ABNORMAL HIGH (ref 8–23)
CO2: 23 mmol/L (ref 22–32)
Calcium: 8.4 mg/dL — ABNORMAL LOW (ref 8.9–10.3)
Chloride: 103 mmol/L (ref 98–111)
Creatinine, Ser: 1.41 mg/dL — ABNORMAL HIGH (ref 0.61–1.24)
GFR, Estimated: 55 mL/min — ABNORMAL LOW (ref >60)
Glucose, Bld: 106 mg/dL — ABNORMAL HIGH (ref 70–99)
Potassium: 3.8 mmol/L (ref 3.5–5.1)
Sodium: 136 mmol/L (ref 135–145)

## 2023-07-10 LAB — HEMOGLOBIN AND HEMATOCRIT, BLOOD
HCT: 26.1 % — ABNORMAL LOW (ref 39.0–52.0)
Hemoglobin: 8.7 g/dL — ABNORMAL LOW (ref 13.0–17.0)

## 2023-07-10 NOTE — Progress Notes (Signed)
3 Days Post-Op   Subjective/Chief Complaint:  1 - Bladder Cancer / Inguinal + Umbilical Hernias - s/p robotic cystoprostatectomy + node dissection + conduit diversion + bilateral inguinal + umbilical hernia repair 07/07/23. Path pending. Admitted 1/23 for bowel prep and stomal marking.  2 - Ileus - bowel anastomosis as part of urinary diversion. Received entereg peri-op. NPO initially post-op. Clears POD 2. Fulls POD 3 as some flatus and small BM.   3 - Disposition / Rehab - independent in all ADL's at baseline. PT eval recs no needs 1/26.   Today "Javell" is progressing. Tolerating clear w/o emesis. Hgb stable and Cr trending down. JP output trending down as well. PT eval yesterday w/o needs. Passing gas and some small BM as well.    Objective: Vital signs in last 24 hours: Temp:  [98 F (36.7 C)-100 F (37.8 C)] 99.2 F (37.3 C) (01/27 0435) Pulse Rate:  [93-103] 93 (01/27 0435) Resp:  [16-19] 18 (01/27 0435) BP: (135-148)/(78-93) 148/78 (01/27 0435) SpO2:  [93 %-99 %] 93 % (01/27 0435) Last BM Date : 07/06/23  Intake/Output from previous day: 01/26 0701 - 01/27 0700 In: 600 [P.O.:600] Out: 1405 [Urine:1150; Drains:255] Intake/Output this shift: No intake/output data recorded.  NAD, AOx3, family at bedside Non-labored breathign on [place] O2 RRR SNTND. RLQ Urostomy pink and patent of non-foul tea colored urine. Rt (red), Lt (blue) bander stents. Recent port and extraction sites c/d/ei. JP with non-foul serosanguinous fluid. SCD's in place  Lab Results:  Recent Labs    07/09/23 0242 07/10/23 0317  HGB 8.7* 8.7*  HCT 26.4* 26.1*   BMET Recent Labs    07/09/23 0242 07/10/23 0317  NA 136 136  K 4.1 3.8  CL 104 103  CO2 24 23  GLUCOSE 95 106*  BUN 25* 26*  CREATININE 1.63* 1.41*  CALCIUM 8.6* 8.4*   PT/INR No results for input(s): "LABPROT", "INR" in the last 72 hours. ABG No results for input(s): "PHART", "HCO3" in the last 72 hours.  Invalid input(s): "PCO2",  "PO2"  Studies/Results: No results found.  Anti-infectives: Anti-infectives (From admission, onward)    Start     Dose/Rate Route Frequency Ordered Stop   07/07/23 1530  piperacillin-tazobactam (ZOSYN) IVPB 3.375 g        3.375 g 12.5 mL/hr over 240 Minutes Intravenous Every 8 hours 07/07/23 1400 07/08/23 1236   07/07/23 1445  piperacillin-tazobactam (ZOSYN) IVPB 3.375 g  Status:  Discontinued        3.375 g 100 mL/hr over 30 Minutes Intravenous Every 8 hours 07/07/23 1356 07/07/23 1400   07/07/23 0600  piperacillin-tazobactam (ZOSYN) IVPB 3.375 g        3.375 g 100 mL/hr over 30 Minutes Intravenous  Once 07/06/23 1647 07/07/23 0750   07/06/23 1745  neomycin (MYCIFRADIN) tablet 500 mg        500 mg Oral Every 4 hours 07/06/23 1649 07/06/23 2140   07/06/23 1745  metroNIDAZOLE (FLAGYL) tablet 500 mg        500 mg Oral Every 4 hours 07/06/23 1649 07/06/23 2140       Assessment/Plan:  Doing well POD 3 after cystectomy. Goals for DC reinforced. Continue ambulating at least 3X per day. Adv to fulls. Request ostomy RN team teaching.    [name]. 07/10/2023

## 2023-07-10 NOTE — Consult Note (Signed)
WOC consulted for new IC patient, will see for first post op teaching tomorrow. No WOC nurse on the [facility] campus today.   WOC Nurse will follow along with you for continued support with ostomy teaching and care Charlotta Lapaglia [facility], RN, [place], CNS, [place] [phone]

## 2023-07-11 LAB — TYPE AND SCREEN
ABO/RH(D): B POS
Antibody Screen: NEGATIVE
Unit division: 0
Unit division: 0

## 2023-07-11 LAB — HEMOGLOBIN AND HEMATOCRIT, BLOOD
HCT: 25.8 % — ABNORMAL LOW (ref 39.0–52.0)
Hemoglobin: 8.6 g/dL — ABNORMAL LOW (ref 13.0–17.0)

## 2023-07-11 LAB — BASIC METABOLIC PANEL
Anion gap: 9 (ref 5–15)
BUN: 23 mg/dL (ref 8–23)
CO2: 24 mmol/L (ref 22–32)
Calcium: 8.4 mg/dL — ABNORMAL LOW (ref 8.9–10.3)
Chloride: 103 mmol/L (ref 98–111)
Creatinine, Ser: 1.55 mg/dL — ABNORMAL HIGH (ref 0.61–1.24)
GFR, Estimated: 49 mL/min — ABNORMAL LOW (ref >60)
Glucose, Bld: 130 mg/dL — ABNORMAL HIGH (ref 70–99)
Potassium: 3.6 mmol/L (ref 3.5–5.1)
Sodium: 136 mmol/L (ref 135–145)

## 2023-07-11 LAB — BPAM RBC
Blood Product Expiration Date: 202502172359
Blood Product Expiration Date: 202502172359
Unit Type and Rh: 7300
Unit Type and Rh: 7300

## 2023-07-11 LAB — CREATININE, FLUID (PLEURAL, PERITONEAL, JP DRAINAGE): Creat, Fluid: 1.2 mg/dL

## 2023-07-11 LAB — SURGICAL PATHOLOGY

## 2023-07-11 MED ORDER — ACETAMINOPHEN 500 MG PO TABS
1000.0000 mg | ORAL_TABLET | Freq: Three times a day (TID) | ORAL | Status: DC
  Administered 2023-07-11 – 2023-07-12 (×4): 1000 mg via ORAL
  Filled 2023-07-11 (×4): qty 2

## 2023-07-11 NOTE — Consult Note (Signed)
WOC Nurse ostomy consult note; ileal conduit by Dr. Berneice Heinrich 07/07/2023; patient was marked preoperatively in ostomy clinic  Stoma type/location: RMQ urostomy  Stomal assessment/size: 1 1/4", round, pink moist, mildly edematous,  budded, os with 2 stents present, os appears to be pointing down at 6 o'clock at time of this visit  Peristomal assessment:  intact  Treatment options for stomal/peristomal skin: 2" barrier ring  Output  bloody mucous covering stoma, blood tinged urine in pouch and bedside drainage bag  Ostomy pouching: place in a 2 1/4" flat skin barrier [place] [phone]) and 2 1/4" urostomy pouch [place] [phone]) at this visit  Education provided: Reviewed with patient that urine should always be flowing from urostomy. We reviewed that a piece of ileum is used to create the urinary diversion and therefore it is normal to see mucus in urine.  We reviewed signs and symptoms of UTI moving forward.  Discussed emptying pouch when 1/3 to 1/2 full. We reviewed hooking up to bedside drainage bag at night.  Demonstrated to wife and patient how to turn indicator on pouch - teardrop showing urine is flowing, teardrop not showing pouch is closed.  Demonstrated how to hook up to bedside drainage bag.    We removed current urostomy pouch using push pull technique. Placed stents on a washcloth. Discussed stents will remain in place until removed by surgeon at follow up visit.  Also told patient if stents come out on their own it is not a reason for alarm. We discussed after stents removed will need to roll up a washcloth or paper towel to catch urine from stoma while changing skin barrier/pouch.  Discussed and demonstrated cleaning stoma and around stoma with water moistened washcloth only.  No baby wipes, soaps, lotions that can leave a residue and cause skin barrier not to stick.  Sized stoma at 1 1/4" today. Wife was able to cut new skin barrier.  Stretched a 2" barrier ring to fit around stoma then placed skin  barrier on top of ring.  Snapped new urostomy pouch onto skin barrier and turned spout to off as patient is going to ambulate at the end of this visit.    Discussed showering with pouch on or off at home.  Also discussed and demonstrated use of ostomy belt. Reviewed crusting with stoma powder and no sting barrier wipe if skin becomes broken down and weeping  Reviewed ostomy clinic handout with patient in addition to all educational materials provided.    I ordered (8) total sets of 2 1/4" skin barrier [place] [phone]) and 2 1/4" urostomy pouch [place] [phone]) and 2" barrier rings for home use.  Provided patient with ostomy belt, ostomy powder and no sting barrier wipes as well. Provided a connector and bedside drainage bag for home use.   Enrolled patient in [employer] DC program: Yes today   WOC team will continue to provide ostomy education and support for patient as long as remains inpatient.  Next visit patient needs to perform pouch change independently.   Thank you,    Priscella Mann MSN, RN-BC, [employer] [phone]

## 2023-07-11 NOTE — Progress Notes (Signed)
Mobility Specialist - Progress Note   07/11/23 0830  Mobility  Activity Ambulated with assistance in hallway  Level of Assistance Independent after set-up  Assistive Device Front wheel walker  Distance Ambulated (ft) 350 ft  Range of Motion/Exercises Active  Activity Response Tolerated well  Mobility Referral Yes  Mobility visit 1 Mobility  Mobility Specialist Start Time (ACUTE ONLY) 0830  Mobility Specialist Stop Time (ACUTE ONLY) [id number]  Mobility Specialist Time Calculation (min) (ACUTE ONLY) 8 min   Received in bed and agreed to mobility. Had no issues throughout session, returned to chair with all needs met. Family in room.  Marilynne Halsted Mobility Specialist

## 2023-07-11 NOTE — Progress Notes (Signed)
4 Days Post-Op   Subjective/Chief Complaint:  1 - Bladder Cancer / Inguinal + Umbilical Hernias - s/p robotic cystoprostatectomy + node dissection + conduit diversion + bilateral inguinal + umbilical hernia repair 07/07/23. Path pending. Admitted 1/23 for bowel prep and stomal marking.  2 - Ileus - bowel anastomosis as part of urinary diversion. Received entereg peri-op. NPO initially post-op. Clears POD 2. Fulls POD 3 as some flatus and small BM. Regular diet POD 4.   3 - Disposition / Rehab - independent in all ADL's at baseline. PT eval recs no needs 1/26.   Today "Ryan Dixon" is doing well. Had ostomy RN teaching this AM which went well. Did well on Fl Lq diet yesterday, continues with flatus.    Objective: Vital signs in last 24 hours: Temp:  [98.1 F (36.7 C)-99.1 F (37.3 C)] 98.1 F (36.7 C) (01/28 0258) Pulse Rate:  [95-104] 104 (01/28 0258) Resp:  [17-20] 17 (01/28 0258) BP: (122-148)/(73-86) 126/73 (01/28 0258) SpO2:  [94 %-96 %] 94 % (01/28 0258) Last BM Date : 07/10/23  Intake/Output from previous day: 01/27 0701 - 01/28 0700 In: -  Out: 560 [Urine:350; Drains:210] Intake/Output this shift: No intake/output data recorded.   NAD, AOx3, family at bedside Non-labored breathign on [place] O2 RRR SNTND. RLQ Urostomy pink and patent of non-foul tea colored urine. Rt (red), Lt (blue) bander stents. Recent port and extraction sites c/d/ei. JP with non-foul serosanguinous fluid. SCD's in place   Lab Results:  Recent Labs    07/10/23 0317 07/11/23 0244  HGB 8.7* 8.6*  HCT 26.1* 25.8*   BMET Recent Labs    07/10/23 0317 07/11/23 0244  NA 136 136  K 3.8 3.6  CL 103 103  CO2 23 24  GLUCOSE 106* 130*  BUN 26* 23  CREATININE 1.41* 1.55*  CALCIUM 8.4* 8.4*   PT/INR No results for input(s): "LABPROT", "INR" in the last 72 hours. ABG No results for input(s): "PHART", "HCO3" in the last 72 hours.  Invalid input(s): "PCO2", "PO2"  Studies/Results: No results  found.  Anti-infectives: Anti-infectives (From admission, onward)    Start     Dose/Rate Route Frequency Ordered Stop   07/07/23 1530  piperacillin-tazobactam (ZOSYN) IVPB 3.375 g        3.375 g 12.5 mL/hr over 240 Minutes Intravenous Every 8 hours 07/07/23 1400 07/08/23 1236   07/07/23 1445  piperacillin-tazobactam (ZOSYN) IVPB 3.375 g  Status:  Discontinued        3.375 g 100 mL/hr over 30 Minutes Intravenous Every 8 hours 07/07/23 1356 07/07/23 1400   07/07/23 0600  piperacillin-tazobactam (ZOSYN) IVPB 3.375 g        3.375 g 100 mL/hr over 30 Minutes Intravenous  Once 07/06/23 1647 07/07/23 0750   07/06/23 1745  neomycin (MYCIFRADIN) tablet 500 mg        500 mg Oral Every 4 hours 07/06/23 1649 07/06/23 2140   07/06/23 1745  metroNIDAZOLE (FLAGYL) tablet 500 mg        500 mg Oral Every 4 hours 07/06/23 1649 07/06/23 2140       Assessment/Plan:  Doing very well POD 4 after major surgery. Reg diet, continue ambulation. Appreciate ostomy RN team help. Based on current progress, likely DC tomorrow v. Following day, will req [facility] if possible to reinforce new ostomy teaching.    [name]. 07/11/2023

## 2023-07-11 NOTE — TOC Initial Note (Signed)
Transition of Care [facility]) - Initial/Assessment Note   Patient Details  Name: Ryan Dixon MRN: [id number] Date of Birth: [birth year]  Transition of Care [facility] CM/SW Contact:    Ewing Schlein, LCSW Phone Number: 07/11/2023, 3:44 PM  Clinical Narrative: [facility] consulted for [facility] for a new urostomy. CSW met with patient and spouse, Tighe Gitto, regarding recommendation. Patient and spouse agreeable to [facility] being set up. CSW provided list of [facility] agencies and [facility] was requested. CSW made [facility] referral to Cindie with [facility], which was accepted. HHRN orders have been placed and [facility] is aware.  Expected Discharge Plan: Home w Home Health Services Barriers to Discharge: Continued Medical Work up  Expected Discharge Plan and Services In-house Referral: Clinical Social Work Post Acute Care Choice: Home Health Living arrangements for the past 2 months: Single Family Home          DME Arranged: N/A DME Agency: NA HH Arranged: RN HH AgencyHotel manager Home Health Care Date HH Agency Contacted: 07/11/23 Time HH Agency Contacted: 1509 Representative spoke with at [facility] Agency: Cindie  Prior Living Arrangements/Services Living arrangements for the past 2 months: Single Family Home Lives with:: Spouse Patient language and need for interpreter reviewed:: Yes Do you feel safe going back to the place where you live?: Yes      Need for Family Participation in Patient Care: No (Comment) Care giver support system in place?: Yes (comment) Criminal Activity/Legal Involvement Pertinent to Current Situation/Hospitalization: No - Comment as needed  Activities of Daily Living ADL Screening (condition at time of admission) Independently performs ADLs?: Yes (appropriate for developmental age) Is the patient deaf or have difficulty hearing?: Yes Does the patient have difficulty seeing, even when wearing glasses/contacts?: No Does the patient have difficulty concentrating, remembering, or making decisions?:  No  Permission Sought/Granted Permission sought to share information with : Other (comment) Permission granted to share information with : Yes, Verbal Permission Granted Permission granted to share info w AGENCY: [facility]  Emotional Assessment Appearance:: Appears stated age Attitude/Demeanor/Rapport: Engaged, Gracious Affect (typically observed): Accepting Orientation: : Oriented to Self, Oriented to Place, Oriented to  Time, Oriented to Situation Alcohol / Substance Use: Not Applicable Psych Involvement: No (comment)  Admission diagnosis:  Bladder cancer [facility]) [C67.9] Patient Active Problem List   Diagnosis Date Noted   Surgical counseling visit 05/31/2023   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 [place] [address] Phone: [phone] Fax: [phone]  Social Drivers of Health (SDOH) Social History: SDOH Screenings   Food Insecurity: No Food Insecurity (07/06/2023)  Housing: Low Risk  (07/06/2023)  Transportation Needs: No Transportation Needs (07/06/2023)  Utilities: Not At Risk (07/06/2023)  Depression (PHQ2-9): Low Risk  (01/25/2023)  Financial Resource Strain: Low Risk  (02/08/2023)   Received from [facility]  Social Connections: Moderately Integrated (07/06/2023)  Stress: No Stress Concern Present (01/25/2023)  Tobacco Use: High Risk (07/07/2023)   Health Literacy: Adequate  Health Literacy (01/25/2023)   SDOH Interventions:    Readmission Risk Interventions     No data to display

## 2023-07-12 ENCOUNTER — Other Ambulatory Visit: Payer: Self-pay

## 2023-07-12 LAB — HEMOGLOBIN AND HEMATOCRIT, BLOOD
HCT: 26 % — ABNORMAL LOW (ref 39.0–52.0)
Hemoglobin: 8.5 g/dL — ABNORMAL LOW (ref 13.0–17.0)

## 2023-07-12 LAB — BASIC METABOLIC PANEL
Anion gap: 9 (ref 5–15)
BUN: 29 mg/dL — ABNORMAL HIGH (ref 8–23)
CO2: 24 mmol/L (ref 22–32)
Calcium: 8.5 mg/dL — ABNORMAL LOW (ref 8.9–10.3)
Chloride: 105 mmol/L (ref 98–111)
Creatinine, Ser: 1.84 mg/dL — ABNORMAL HIGH (ref 0.61–1.24)
GFR, Estimated: 40 mL/min — ABNORMAL LOW (ref >60)
Glucose, Bld: 103 mg/dL — ABNORMAL HIGH (ref 70–99)
Potassium: 3.6 mmol/L (ref 3.5–5.1)
Sodium: 138 mmol/L (ref 135–145)

## 2023-07-12 MED ORDER — SENNOSIDES-DOCUSATE SODIUM 8.6-50 MG PO TABS: 1.0000 | ORAL_TABLET | Freq: Two times a day (BID) | ORAL | 0 refills | Status: DC

## 2023-07-12 MED ORDER — OXYCODONE HCL 5 MG PO TABS
5.0000 mg | ORAL_TABLET | Freq: Four times a day (QID) | ORAL | 0 refills | Status: DC | PRN
Start: 2023-07-12 — End: 2024-04-11

## 2023-07-12 MED ORDER — HEPARIN SOD (PORK) LOCK FLUSH 100 UNIT/ML IV SOLN
500.0000 [IU] | INTRAVENOUS | Status: AC | PRN
  Administered 2023-07-12: 500 [IU]
  Filled 2023-07-12: qty 5

## 2023-07-12 NOTE — Plan of Care (Signed)

## 2023-07-12 NOTE — Progress Notes (Signed)
Patient discharging home.  IV removed - WNL, awaiting port to be de-accessed prior to leaving.  Reviewed AVs and medications with patient.  Patient verbalizes understanding of all instructions. No questions at this time.

## 2023-07-12 NOTE — Consult Note (Addendum)
WOC Nurse ostomy follow up Stoma type/location: RMQ urostomy  Stomal assessment/size: 1 1/4" round, well budded, slightly edematous, pink moist, stents protruding from os  Peristomal assessment: intact  Treatment options for stomal/peristomal skin: 2" barrier ring  Output yellow blood tinged urine  Ostomy pouching: 2 pc 2 1/4" flat skin barrier, 2 1/4" urostomy pouch and 2" barrier ring  Education provided: Patient removed old bedside drainage bag from current pouch. Patient performed pouch change independently with wife video taping on her phone.  Patient was able to remove his current pouch, clean around stoma with water moistened washcloth. Patient sized own stoma and wife cut out new skin barrier. Patient placed 2" barrier ring around stoma and placed skin barrier on top of barrier ring. Patient snapped new pouch onto skin barrier with minimal assistance.  We did discuss once stoma size has settled and stents are removed he could either get pre cut skin barriers or cut ahead of time and attach pouch before removing old pouching system.  Overall patient and wife feel confident in care of ostomy at home.   Patient provided with 2 1/4" flat skin barrier and pouches as per previous note. I did provide patient with 2 convex pouches in case he has issues at home and wishes to try. Patient will have home health following. I also encouraged patient to reach out to ostomy clinic if he has issues with leaking at home and having to change pouch more frequently than twice a week as may require different products.    Enrolled patient in [place] Secure Start Discharge program: Yes, patient states he has received email from them already   Thank you,    Priscella Mann MSN, RN-BC, [employer] [phone]

## 2023-07-12 NOTE — Discharge Summary (Signed)
Physician Discharge Summary  Patient ID: BASSAM DRESCH MRN: [id number] DOB/AGE: [birth year] 68 y.o.  Admit date: 07/06/2023 Discharge date: 07/12/2023  Admission Diagnoses: Bladder Cancer  Discharge Diagnoses:  Principal Problem:   Bladder cancer [facility])   Discharged Condition: good  Hospital Course:   1 - Bladder Cancer / Inguinal + Umbilical Hernias - s/p robotic cystoprostatectomy + node dissection + conduit diversion + bilateral inguinal + umbilical hernia repair 07/07/23. Final path [id number] with negative margins. Admitted 1/23 for bowel prep and stomal marking. Hgb 8.6 (stable), Cr 1.8 (stable), JP removed prior to DC as Cr same as serum.   2 - Ileus - bowel anastomosis as part of urinary diversion. Received entereg peri-op. NPO initially post-op. Clears POD 2. Fulls POD 3 as some flatus and small BM. Regular diet POD 4 and tolerating with full bowel function at discharge.   3 - Disposition / Rehab - independent in all ADL's at baseline. PT eval recs no needs 1/26. Ostomy RN team worked with in house for initial ostomy teaching and he will have home health through [place] at discharge for additional teaching / supplies.   Consults:  PT, ostomy RN, case management  Significant Diagnostic Studies: labs: as per above.   Treatments: surgery: as per above  Discharge Exam: Blood pressure (!) 147/79, pulse 80, temperature 98.3 F (36.8 C), temperature source Oral, resp. rate 20, height 5\' 10"  (1.778 m), weight 68.9 kg, SpO2 95%.   NAD, AOx3, wife at bedside. Both extremely pleasant, at baseline.  Non-labored breathign on [place] O2 RRR SNTND. RLQ Urostomy pink and patent of non-foul tea colored urine. Rt (red), Lt (blue) bander stents. Recent port and extraction sites c/d/ei. JP with non-foul serosanguinous fluid. Removed and dry dressing placed.  SCD's in place  Disposition: Discharge disposition: 01-Home or Self Care          Follow-up Information     Berneice Heinrich Delbert Phenix., MD  Follow up on 07/27/2023.   Specialty: Urology Why: at 9:15 for MD visit Contact information: [address] [place] [address] [phone]         Care, [facility] Follow up.   Specialty: Home Health Services Why: [facility] will provide nursing for your new urostomy after discharge. Contact information: [address] [place] [address] [phone]                 Signed: [name]. 07/12/2023, 12:54 PM

## 2023-07-12 NOTE — TOC Transition Note (Signed)
Transition of Care [facility]) - Discharge Note  Patient Details  Name: Ryan Dixon MRN: [id number] Date of Birth: 08/16/1955  Transition of Care [facility]) CM/SW Contact:  Ewing Schlein, LCSW Phone Number: 07/12/2023, 12:56 PM  Clinical Narrative: Patient will discharge home today. CSW notified Cindie with [place]. TOC signing off.    Final next level of care: Home w Home Health Services Barriers to Discharge: Barriers Resolved  Patient Goals and CMS Choice Patient states their goals for this hospitalization and ongoing recovery are:: Discharge home CMS Medicare.gov Compare Post Acute Care list provided to:: Patient Choice offered to / list presented to : Patient  Discharge Plan and Services Additional resources added to the After Visit Summary for   In-house Referral: Clinical Social Work Post Acute Care Choice: Home Health          DME Arranged: N/A DME Agency: NA HH Arranged: RN HH Agency: [facility] Date [facility] Agency Contacted: 07/11/23 Time HH Agency Contacted: [id number] Representative spoke with at [facility] Agency: Cindie  Social Drivers of Health (SDOH) Interventions SDOH Screenings   Food Insecurity: No Food Insecurity (07/06/2023)  Housing: Low Risk  (07/06/2023)  Transportation Needs: No Transportation Needs (07/06/2023)  Utilities: Not At Risk (07/06/2023)  Depression (PHQ2-9): Low Risk  (01/25/2023)  Financial Resource Strain: Low Risk  (02/08/2023)   Received from [facility]  Social Connections: Moderately Integrated (07/06/2023)  Stress: No Stress Concern Present (01/25/2023)  Tobacco Use: High Risk (07/07/2023)  Health Literacy: Adequate Health Literacy (01/25/2023)   Readmission Risk Interventions     No data to display

## 2023-07-17 ENCOUNTER — Emergency Department (HOSPITAL_COMMUNITY): Payer: Medicare Other

## 2023-07-17 ENCOUNTER — Other Ambulatory Visit: Payer: Self-pay

## 2023-07-17 ENCOUNTER — Emergency Department (HOSPITAL_COMMUNITY)
Admission: EM | Admit: 2023-07-17 | Discharge: 2023-07-18 | Disposition: A | Discharge status: Home / Self Care | Payer: Medicare Other | Attending: Student | Admitting: Student

## 2023-07-17 DIAGNOSIS — I708 Atherosclerosis of other arteries: Secondary | ICD-10-CM | POA: Insufficient documentation

## 2023-07-17 DIAGNOSIS — J101 Influenza due to other identified influenza virus with other respiratory manifestations: Secondary | ICD-10-CM | POA: Insufficient documentation

## 2023-07-17 DIAGNOSIS — Z8551 Personal history of malignant neoplasm of bladder: Secondary | ICD-10-CM | POA: Insufficient documentation

## 2023-07-17 DIAGNOSIS — R509 Fever, unspecified: Secondary | ICD-10-CM | POA: Diagnosis present

## 2023-07-17 DIAGNOSIS — N3000 Acute cystitis without hematuria: Secondary | ICD-10-CM | POA: Insufficient documentation

## 2023-07-17 DIAGNOSIS — I10 Essential (primary) hypertension: Secondary | ICD-10-CM | POA: Insufficient documentation

## 2023-07-17 DIAGNOSIS — N1330 Unspecified hydronephrosis: Secondary | ICD-10-CM | POA: Diagnosis not present

## 2023-07-17 DIAGNOSIS — Z79899 Other long term (current) drug therapy: Secondary | ICD-10-CM | POA: Insufficient documentation

## 2023-07-17 DIAGNOSIS — Z20822 Contact with and (suspected) exposure to covid-19: Secondary | ICD-10-CM | POA: Insufficient documentation

## 2023-07-17 DIAGNOSIS — I700 Atherosclerosis of aorta: Secondary | ICD-10-CM | POA: Insufficient documentation

## 2023-07-17 DIAGNOSIS — K4090 Unilateral inguinal hernia, without obstruction or gangrene, not specified as recurrent: Secondary | ICD-10-CM | POA: Diagnosis not present

## 2023-07-17 DIAGNOSIS — N132 Hydronephrosis with renal and ureteral calculous obstruction: Secondary | ICD-10-CM | POA: Insufficient documentation

## 2023-07-17 DIAGNOSIS — F17210 Nicotine dependence, cigarettes, uncomplicated: Secondary | ICD-10-CM | POA: Insufficient documentation

## 2023-07-17 LAB — CBC WITH DIFFERENTIAL/PLATELET
Abs Immature Granulocytes: 0.16 10*3/uL — ABNORMAL HIGH (ref 0.00–0.07)
Basophils Absolute: 0 10*3/uL (ref 0.0–0.1)
Basophils Relative: 0 %
Eosinophils Absolute: 0.1 10*3/uL (ref 0.0–0.5)
Eosinophils Relative: 1 %
HCT: 31.5 % — ABNORMAL LOW (ref 39.0–52.0)
Hemoglobin: 10.3 g/dL — ABNORMAL LOW (ref 13.0–17.0)
Immature Granulocytes: 1 %
Lymphocytes Relative: 8 %
Lymphs Abs: 1.6 10*3/uL (ref 0.7–4.0)
MCH: 32.4 pg (ref 26.0–34.0)
MCHC: 32.7 g/dL (ref 30.0–36.0)
MCV: 99.1 fL (ref 80.0–100.0)
Monocytes Absolute: 1.1 10*3/uL — ABNORMAL HIGH (ref 0.1–1.0)
Monocytes Relative: 5 %
Neutro Abs: 17.3 10*3/uL — ABNORMAL HIGH (ref 1.7–7.7)
Neutrophils Relative %: 85 %
Platelets: 310 10*3/uL (ref 150–400)
RBC: 3.18 MIL/uL — ABNORMAL LOW (ref 4.22–5.81)
RDW: 11.9 % (ref 11.5–15.5)
WBC: 20.2 10*3/uL — ABNORMAL HIGH (ref 4.0–10.5)
nRBC: 0 % (ref 0.0–0.2)

## 2023-07-17 LAB — URINALYSIS, W/ REFLEX TO CULTURE (INFECTION SUSPECTED)
Bilirubin Urine: NEGATIVE
Glucose, UA: NEGATIVE mg/dL
Ketones, ur: NEGATIVE mg/dL
Nitrite: POSITIVE — AB
Protein, ur: 100 mg/dL — AB
RBC / HPF: >50 RBC/hpf (ref 0–5)
Specific Gravity, Urine: 1.013 (ref 1.005–1.030)
pH: 6 (ref 5.0–8.0)

## 2023-07-17 LAB — COMPREHENSIVE METABOLIC PANEL
ALT: 36 U/L (ref 0–44)
AST: 29 U/L (ref 15–41)
Albumin: 3.7 g/dL (ref 3.5–5.0)
Alkaline Phosphatase: 75 U/L (ref 38–126)
Anion gap: 11 (ref 5–15)
BUN: 33 mg/dL — ABNORMAL HIGH (ref 8–23)
CO2: 23 mmol/L (ref 22–32)
Calcium: 8.5 mg/dL — ABNORMAL LOW (ref 8.9–10.3)
Chloride: 103 mmol/L (ref 98–111)
Creatinine, Ser: 2.03 mg/dL — ABNORMAL HIGH (ref 0.61–1.24)
GFR, Estimated: 36 mL/min — ABNORMAL LOW (ref >60)
Glucose, Bld: 120 mg/dL — ABNORMAL HIGH (ref 70–99)
Potassium: 3.3 mmol/L — ABNORMAL LOW (ref 3.5–5.1)
Sodium: 137 mmol/L (ref 135–145)
Total Bilirubin: 0.5 mg/dL (ref 0.0–1.2)
Total Protein: 7.2 g/dL (ref 6.5–8.1)

## 2023-07-17 LAB — PROTIME-INR
INR: 1 (ref 0.8–1.2)
Prothrombin Time: 13.2 s (ref 11.4–15.2)

## 2023-07-17 LAB — RESP PANEL BY RT-PCR (RSV, FLU A&B, COVID)  RVPGX2
Influenza A by PCR: POSITIVE — AB
Influenza B by PCR: NEGATIVE
Resp Syncytial Virus by PCR: NEGATIVE
SARS Coronavirus 2 by RT PCR: NEGATIVE

## 2023-07-17 LAB — APTT: aPTT: 29 s (ref 24–36)

## 2023-07-17 LAB — I-STAT CG4 LACTIC ACID, ED: Lactic Acid, Venous: 0.9 mmol/L (ref 0.5–1.9)

## 2023-07-17 MED ORDER — ACETAMINOPHEN 500 MG PO TABS
1000.0000 mg | ORAL_TABLET | Freq: Once | ORAL | Status: AC
  Administered 2023-07-17: 1000 mg via ORAL
  Filled 2023-07-17: qty 2

## 2023-07-17 MED ORDER — MAGNESIUM OXIDE -MG SUPPLEMENT 400 (240 MG) MG PO TABS
800.0000 mg | ORAL_TABLET | Freq: Once | ORAL | Status: AC
  Administered 2023-07-17: 800 mg via ORAL
  Filled 2023-07-17: qty 2

## 2023-07-17 MED ORDER — LACTATED RINGERS IV BOLUS
1000.0000 mL | Freq: Once | INTRAVENOUS | Status: AC
Start: 2023-07-17 — End: 2023-07-17
  Administered 2023-07-17: 1000 mL via INTRAVENOUS

## 2023-07-17 MED ORDER — SODIUM CHLORIDE 0.9 % IV SOLN
2.0000 g | Freq: Once | INTRAVENOUS | Status: AC
Start: 2023-07-17 — End: 2023-07-17
  Administered 2023-07-17: 2 g via INTRAVENOUS
  Filled 2023-07-17: qty 20

## 2023-07-17 MED ORDER — POTASSIUM CHLORIDE CRYS ER 20 MEQ PO TBCR
40.0000 meq | EXTENDED_RELEASE_TABLET | Freq: Once | ORAL | Status: AC
  Administered 2023-07-17: 40 meq via ORAL
  Filled 2023-07-17: qty 2

## 2023-07-17 MED ORDER — IOHEXOL 300 MG/ML  SOLN
100.0000 mL | Freq: Once | INTRAMUSCULAR | Status: AC | PRN
  Administered 2023-07-17: 100 mL via INTRAVENOUS

## 2023-07-17 MED ORDER — FOSFOMYCIN TROMETHAMINE 3 G PO PACK: 3.0000 g | PACK | Freq: Once | ORAL | Status: DC

## 2023-07-17 NOTE — ED Triage Notes (Signed)
Patient to ED by POV with c/o Fever. He reports having surgery on 1/24 and states today he had a fever of 102.4. Patient had bladder removed and had ostomy placed noticed white pus around stoma. Denies N/V/chills.

## 2023-07-17 NOTE — Treatment Plan (Signed)
Spoke with ED team.   Pt 67 YOM with history of MIBG s/p RCIC on 07/07/2023. Pt presented from home after having fever up to 101. Only symptom intermittent coughing and chills.   Work-up in ED: + for Influenza on respiratory panel WBC 20, lactate non-elevated Hds, normotensive. Satting well on [employer]. HR in high 90's/low 100's Creatinine 2.0 (baseline appears to be 1.8).  Ct scan showing bilateral stents in place (right stent in proximal portion of ureter) with minimal hydro. No periureteral stranding or patchiness to suggest acute inflammatory or infectious process, although UA grossly positive.  ED physician states some mucous around stoma and in bag, does not appear purulent.   Largely suspect source of fever from URI given coughing and lack of any other specific symptoms. UA positive, but asymptomatic bacteruria is expected in setting of ileal conduit ad bilateral stents. Reassured given findings on CT and lack of any urinary symptoms. However, reasonable to treat empirically with antibiotics and to follow-up urine culture at this time.  Pt is scheduled for follow-up with Dr. Berneice Heinrich on 07/27/2023.   Roby Lofts, MD Resident Physician [facility]

## 2023-07-17 NOTE — ED Provider Triage Note (Signed)
Emergency Medicine Provider Triage Evaluation Note  Ryan Dixon , a 68 y.o. male  was evaluated in triage.  Pt complains of fever.  Patient is approximately 10 days postop in which she had his bladder, lymph nodes, and prostate removed.  He also has some concerns for an abnormal discharge coming from his urostomy site in his abdomen.  No recent sick contacts or as patient is aware.  Review of Systems  Positive: As above Negative: As above  Physical Exam  Ht 5\' 10"  (1.778 m)   Wt 68.9 kg   BMI 21.81 kg/m  Gen:   Awake, no distress   Resp:  Normal effort  MSK:   Moves extremities without difficulty  Other:  White crusty type discharge present in the urostomy site.  Urine appears to be normal in color and clarity.  Medical Decision Making  Medically screening exam initiated at 6:11 PM.  Appropriate orders placed.  Ryan Dixon was informed that the remainder of the evaluation will be completed by another provider, this initial triage assessment does not replace that evaluation, and the importance of remaining in the ED until their evaluation is complete.  Given patient's low-grade fever 9 9.4 degrees and reported fevers at home as well as abnormal drainage coming from urostomy site, will initiate sepsis labs although will not call code sepsis at this time.  Patient has been prioritized to be next in line for a room in the acute care side.   Smitty Knudsen, PA-C 07/17/23 [phone]

## 2023-07-18 NOTE — ED Provider Notes (Signed)
[place] EMERGENCY DEPARTMENT AT [facility] Provider Note  CSN: [id number] Arrival date & time: 07/17/23 1709  Chief Complaint(s) Fever  HPI Ryan Dixon is a 68 y.o. male with PMH bladder cancer status post robotic cystoprostatectomy with conduit diversion, bilateral inguinal and umbilical hernia repairs on 07/07/2023 who presents emergency department for evaluation of fever and chills.  Started to notice generalized bodyaches, cough and chills today as well as some mucus-like abnormal discharge from his urostomy site.  Patient is overall well-appearing but does arrive febrile.  Denies associated chest pain, shortness of breath, headache, diarrhea or other systemic symptoms.   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 Authorizing Provider  atorvastatin (LIPITOR) 20 MG tablet TAKE 1 TABLET BY MOUTH EVERY DAY AT NIGHT 06/27/22   [provider]  hydrochlorothiazide (HYDRODIURIL) 25 MG tablet Take 25 mg  by mouth daily. 03/23/20   [provider]  oxyCODONE (ROXICODONE) 5 MG immediate release tablet Take 1 tablet (5 mg total) by mouth every 6 (six) hours as needed for moderate pain (pain score 4-6) or severe pain (pain score 7-10) (post-operatively). 07/12/23 07/11/24  [name]., MD  senna-docusate (SENOKOT-S) 8.6-50 MG tablet Take 1 tablet by mouth 2 (two) times daily. While taking strong pain meds to prevent constipation. 07/12/23   [name]., MD  varenicline (CHANTIX) 1 MG tablet Take 1 mg by mouth 2 (two) times daily.    [provider]  Wheat Dextrin (BENEFIBER) POWD Take 0.5 Scoops by mouth daily.    [provider]                                                                                                                                    Past Surgical History Past Surgical History:  Procedure Laterality Date   ANKLE ARTHROSCOPY Left 08/11/2020   Procedure: LEFT ANKLE ARTHROSCOPY AND DEBRIDEMENT;  Surgeon: Nadara Mustard, MD;  Location: [facility];  Service: Orthopedics;  Laterality: Left;   BLADDER INSTILLATION N/A 11/29/2022   Procedure: BLADDER INSTILLATION OF GEMCITABINE;  Surgeon: [name], MD;  Location: [facility];  Service: Urology;  Laterality: N/A;   COLONOSCOPY     CYSTOSCOPY WITH INJECTION N/A 07/07/2023   Procedure: CYSTOSCOPY WITH INJECTION;  Surgeon: [name]., MD;  Location: [facility];  Service: Urology;  Laterality: N/A;   IR IMAGING GUIDED PORT INSERTION  01/20/2023   PELVIC LYMPH NODE DISSECTION N/A 07/07/2023   Procedure: PELVIC LYMPH NODE DISSECTION;  Surgeon: [name]., MD;  Location: [facility];  Service: Urology;  Laterality: N/A;   ROBOT ASSISTED LAPAROSCOPIC COMPLETE CYSTECT ILEAL CONDUIT N/A 07/07/2023   Procedure: XI ROBOTIC ASSISTED LAPAROSCOPIC COMPLETE CYSTECT ILEAL CONDUIT, UMBILICAL HERNIA REPAIR, BILATERAL INGUINAL HERNIA REPAIR;  Surgeon: [name]., MD;   Location: [facility];  Service: Urology;  Laterality: N/A;   ROBOT ASSISTED LAPAROSCOPIC RADICAL PROSTATECTOMY N/A 07/07/2023   Procedure: XI ROBOTIC ASSISTED LAPAROSCOPIC RADICAL PROSTATECTOMY;  Surgeon: [name]., MD;  Location: [facility];  Service: Urology;  Laterality: N/A;   TONSILLECTOMY     TRANSURETHRAL RESECTION OF BLADDER TUMOR N/A 11/29/2022   Procedure: TRANSURETHRAL RESECTION OF BLADDER TUMOR (TURBT);  Surgeon: [name], MD;  Location: [facility];  Service: Urology;  Laterality: N/A;   Family History No family history on file.  Social History Social History   Tobacco Use   Smoking status: Every Day    Current packs/day: 2.00    Average packs/day: 2.0 packs/day for 43.0 years (86.0 ttl pk-yrs)    Types: Cigarettes   Smokeless tobacco: Never  Substance Use Topics   Alcohol use: Yes    Comment: 1-2 beers/day   Drug use: Never   Allergies Nsaids and Other  Review of Systems Review of Systems  Constitutional:  Positive for chills and fever.    Physical Exam Vital Signs  I have reviewed the triage vital signs BP 117/69 (BP Location: Right Arm)   Pulse (!) 101   Temp (!) 101 F (38.3 C) (Oral)   Resp 20   Ht 5\' 10"  (1.778 m)   Wt 68.9 kg   SpO2 99%   BMI 21.81 kg/m   Physical Exam Constitutional:      General: He is not in acute distress.    Appearance: Normal appearance.  HENT:     Head: Normocephalic and atraumatic.     Nose: No congestion or rhinorrhea.  Eyes:     General:        Right eye: No discharge.        Left eye: No discharge.     Extraocular Movements: Extraocular movements intact.     Pupils: Pupils are equal, round, and reactive to light.  Cardiovascular:     Rate and Rhythm: Normal rate and regular rhythm.     Heart sounds: No murmur heard. Pulmonary:     Effort: No respiratory distress.     Breath sounds: No wheezing or rales.  Abdominal:     General: There is no distension.     Tenderness: There is no abdominal  tenderness.  Musculoskeletal:        General: Normal range of motion.     Cervical back: Normal range of motion.  Skin:    General: Skin is warm and dry.  Neurological:     General: No focal deficit present.     Mental Status:  He is alert.     ED Results and Treatments Labs (all labs ordered are listed, but only abnormal results are displayed) Labs Reviewed  RESP PANEL BY RT-PCR (RSV, FLU A&B, COVID)  RVPGX2 - Abnormal; Notable for the following components:      Result Value   Influenza A by PCR POSITIVE (*)    All other components within normal limits  COMPREHENSIVE METABOLIC PANEL - Abnormal; Notable for the following components:   Potassium 3.3 (*)    Glucose, Bld 120 (*)    BUN 33 (*)    Creatinine, Ser 2.03 (*)    Calcium 8.5 (*)    GFR, Estimated 36 (*)    All other components within normal limits  CBC WITH DIFFERENTIAL/PLATELET - Abnormal; Notable for the following components:   WBC 20.2 (*)    RBC 3.18 (*)    Hemoglobin 10.3 (*)    HCT 31.5 (*)    Neutro Abs 17.3 (*)    Monocytes Absolute 1.1 (*)    Abs Immature Granulocytes 0.16 (*)    All other components within normal limits  URINALYSIS, W/ REFLEX TO CULTURE (INFECTION SUSPECTED) - Abnormal; Notable for the following components:   APPearance HAZY (*)    Hgb urine dipstick MODERATE (*)    Protein, ur 100 (*)    Nitrite POSITIVE (*)    Leukocytes,Ua LARGE (*)    Bacteria, UA MANY (*)    All other components within normal limits  CULTURE, BLOOD (ROUTINE X 2)  CULTURE, BLOOD (ROUTINE X 2)  URINE CULTURE  PROTIME-INR  APTT  I-STAT CG4 LACTIC ACID, ED  I-STAT CG4 LACTIC ACID, ED                                                                                                                          Radiology CT ABDOMEN PELVIS W CONTRAST Result Date: 07/17/2023 CLINICAL DATA:  Sepsis recent surgery fever bladder removal with ostomy pus around stoma EXAM: CT ABDOMEN AND PELVIS WITH CONTRAST TECHNIQUE:  Multidetector CT imaging of the abdomen and pelvis was performed using the standard protocol following bolus administration of intravenous contrast. RADIATION DOSE REDUCTION: This exam was performed according to the departmental dose-optimization program which includes automated exposure control, adjustment of the mA and/or kV according to patient size and/or use of iterative reconstruction technique. CONTRAST:  OMNIPAQUE IOHEXOL 300 MG/ML  SOLN COMPARISON:  CT 05/09/2023 FINDINGS: Lower chest: Lung bases demonstrate no acute airspace disease. Hepatobiliary: No focal liver abnormality is seen. No gallstones, gallbladder wall thickening, or biliary dilatation. Pancreas: Unremarkable. No pancreatic ductal dilatation or surrounding inflammatory changes. Spleen: Normal in size without focal abnormality. Adrenals/Urinary Tract: Adrenal glands are normal. Kidneys show no focal abnormality. Mild right hydronephrosis. Interim placement of bilateral ureteral stents, on the left the pigtail is at the left renal collecting system and on the right the proximal pigtail is in the proximal right ureter. Stents extend through the ileal conduit and exit at  the right abdominal urostomy. Interval cystoprostatectomy with ileal conduit and right abdominal urostomy. No drainable abscess in the vicinity of the right abdominal ostomy. Stomach/Bowel: The stomach is nonenlarged. Some air-filled mildly dilated left mid quadrant small bowel without obstruction, likely ileus. Wall thickening and mucosal enhancement of the cecum and ascending colon with additional mild wall thickening and mucosal enhancement of the rectosigmoid colon. Negative appendix Vascular/Lymphatic: Advanced aortic atherosclerosis. No aneurysm. Circumferential mural thrombus in the distal aorta with mild to moderate luminal narrowing. Extensive vascular disease of the iliac vessels with short segment chronic occlusion or high-grade stenosis of the right common iliac  artery. Moderate severe stenosis of the left common iliac artery as well. Reproductive: Prostatectomy Other: Small volume free fluid in the pelvis. Extensive soft tissue gas within the proximal thighs, and at the anterolateral aspect of the pelvis and abdomen, likely due to recent laparoscopic procedure and surgery. Small inguinal hernias, containing fat on the right and fat and fluid on the left. Gas extending into the inguinal regions and scrotum. Musculoskeletal: No acute or suspicious osseous abnormality. IMPRESSION: 1. Interval cystoprostatectomy with ileal conduit and right abdominal urostomy. Negative for abscess in the region of the right abdominal ostomy. Interim placement of bilateral ureteral stents, proximal pigtail of the right ureteral stent is in the proximal right ureter and there is interval mild right hydronephrosis proximal to the right ureteral stent. 2. Small volume free fluid in the pelvis.  No organized abscess. 3. Extensive soft tissue gas within the proximal thighs, and at the anterolateral aspect of the pelvis and abdomen, likely due to recent laparoscopic procedure and surgery. 4. Wall thickening and mucosal enhancement of the cecum and ascending colon with additional mild wall thickening and mucosal enhancement of the rectosigmoid colon, findings suggestive of colitis. 5. Mildly dilated air-filled left mid quadrant small bowel without obstruction, likely ileus. 6. Advanced aortic atherosclerosis. Short segment chronic occlusion or high-grade stenosis of the right common iliac artery. Moderate to severe stenosis of the proximal left common iliac artery. Aortic Atherosclerosis (ICD10-I70.0). Electronically Signed   By: Jasmine Pang M.D.   On: 07/17/2023 21:48   DG Chest Port 1 View Result Date: 07/17/2023 CLINICAL DATA:  Question of sepsis to evaluate for abnormality. EXAM: PORTABLE CHEST 1 VIEW COMPARISON:  CT chest abdomen and pelvis 05/09/2023 FINDINGS: Power port type central venous  catheter with tip over the low SVC region. Heart size and pulmonary vascularity are normal. Lungs are clear. No pleural effusion or pneumothorax is identified. Mediastinal contours appear intact. Subcutaneous emphysema in the right lateral chest wall. IMPRESSION: 1. Subcutaneous emphysema in the right chest wall. Etiology is nonspecific, possibly prior instrumentation or soft tissue infection. For 2 lungs are clear. No pleural effusions or pneumothorax identified. Electronically Signed   By: Burman Nieves M.D.   On: 07/17/2023 19:20    Pertinent labs & imaging results that were available during my care of the patient were reviewed by me and considered in my medical decision making (see MDM for details).  Medications Ordered in ED Medications  lactated ringers bolus 1,000 mL (0 mLs Intravenous Stopped 07/17/23 2253)  potassium chloride SA (KLOR-CON M) CR tablet 40 mEq (40 mEq Oral Given 07/17/23 2024)  magnesium oxide (MAG-OX) tablet 800 mg (800 mg Oral Given 07/17/23 2024)  iohexol (OMNIPAQUE) 300 MG/ML solution 100 mL (100 mLs Intravenous Contrast Given 07/17/23 2115)  cefTRIAXone (ROCEPHIN) 2 g in sodium chloride 0.9 % 100 mL IVPB (0 g Intravenous Stopped 07/17/23 2253)  acetaminophen (  TYLENOL) tablet 1,000 mg (1,000 mg Oral Given 07/17/23 2220)                                                                                                                                     Procedures Procedures  (including critical care time)  Medical Decision Making / ED Course   This patient presents to the ED for concern of fever, chills, this involves an extensive number of treatment options, and is a complaint that carries with it a high risk of complications and morbidity.  The differential diagnosis includes COVID-19, influenza, RSV, unspecified viral URI, pneumonia, postoperative infection, urinary infection, bacteremia  MDM: Seen emerged from for evaluation of fever and chills.  Physical exam is largely  unremarkable, conduit site appears clean dry and intact and wounds are healing well.  Patient meeting SIRS criteria on arrival and blood cultures were obtained.  Lactic acid is normal.  Laboratory evaluation with a significant leukocytosis to 20.2, hemoglobin 10.3, potassium 3.3, BUN 33, creatinine 2.03.  Patient is influenza positive which likely explains his cough fever and chills but given postoperative status we did expand his workup.  His urinalysis has positive nitrites, large leuk esterase, greater than 50 red blood cells, 21-50 white blood cells and many bacteria but he is likely colonized given his ileal conduit.  CT abdomen pelvis was obtained to evaluate for postoperative abscess that is reassuringly negative for abscess, mild interval right hydronephrosis proximal to the right ureteral stent, possible colitis and other postoperative expected findings.  I spoke with the urology resident on-call Dr. Dalbert Mayotte who reviewed the imaging and agrees that these are normal postoperative findings.  We will be cautious and cover with ceftriaxone in the setting of his urinalysis but I do suspect the patient's overall presentation today is likely secondary to his influenza infection.  We will follow his culture data and send in additional antibiotics as appropriate.  Patient states he will contact Dr. Kathrynn Running for follow-up.  At this time he does not meet inpatient criteria for admission and will be discharged outpatient follow-up   Additional history obtained: -Additional history obtained from wife -External records from outside source obtained and reviewed including: Chart review including previous notes, labs, imaging, consultation notes   Lab Tests: -I ordered, reviewed, and interpreted labs.   The pertinent results include:   Labs Reviewed  RESP PANEL BY RT-PCR (RSV, FLU A&B, COVID)  RVPGX2 - Abnormal; Notable for the following components:      Result Value   Influenza A by PCR POSITIVE (*)    All  other components within normal limits  COMPREHENSIVE METABOLIC PANEL - Abnormal; Notable for the following components:   Potassium 3.3 (*)    Glucose, Bld 120 (*)    BUN 33 (*)    Creatinine, Ser 2.03 (*)    Calcium 8.5 (*)    GFR, Estimated 36 (*)    All other  components within normal limits  CBC WITH DIFFERENTIAL/PLATELET - Abnormal; Notable for the following components:   WBC 20.2 (*)    RBC 3.18 (*)    Hemoglobin 10.3 (*)    HCT 31.5 (*)    Neutro Abs 17.3 (*)    Monocytes Absolute 1.1 (*)    Abs Immature Granulocytes 0.16 (*)    All other components within normal limits  URINALYSIS, W/ REFLEX TO CULTURE (INFECTION SUSPECTED) - Abnormal; Notable for the following components:   APPearance HAZY (*)    Hgb urine dipstick MODERATE (*)    Protein, ur 100 (*)    Nitrite POSITIVE (*)    Leukocytes,Ua LARGE (*)    Bacteria, UA MANY (*)    All other components within normal limits  CULTURE, BLOOD (ROUTINE X 2)  CULTURE, BLOOD (ROUTINE X 2)  URINE CULTURE  PROTIME-INR  APTT  I-STAT CG4 LACTIC ACID, ED  I-STAT CG4 LACTIC ACID, ED      EKG   EKG Interpretation Date/Time:  Monday July 17 2023 18:12:01 EST Ventricular Rate:  120 PR Interval:  154 QRS Duration:  93 QT Interval:  302 QTC Calculation: 427 R Axis:   84  Text Interpretation: Sinus tachycardia Consider right atrial enlargement Borderline right axis deviation Borderline repolarization abnormality When compared with ECG of 12/05/2023, REPOLARIZATION ABNORMALITY is now present Confirmed by Dione Booze ([id number]) on 07/18/2023 12:53:26 AM         Imaging Studies ordered: I ordered imaging studies including CTAP I independently visualized and interpreted imaging. I agree with the radiologist interpretation   Medicines ordered and prescription drug management: Meds ordered this encounter  Medications   lactated ringers bolus 1,000 mL   potassium chloride SA (KLOR-CON M) CR tablet 40 mEq   magnesium oxide  (MAG-OX) tablet 800 mg   iohexol (OMNIPAQUE) 300 MG/ML solution 100 mL   DISCONTD: fosfomycin (MONUROL) packet 3 g   cefTRIAXone (ROCEPHIN) 2 g in sodium chloride 0.9 % 100 mL IVPB    Antibiotic Indication::   UTI   acetaminophen (TYLENOL) tablet 1,000 mg    -I have reviewed the patients home medicines and have made adjustments as needed  Critical interventions none  Consultations Obtained: I requested consultation with the urology resident on-call Dr.Frisbie,  and discussed lab and imaging findings as well as pertinent plan - they recommend: Outpatient follow-up   Cardiac Monitoring: The patient was maintained on a cardiac monitor.  I personally viewed and interpreted the cardiac monitored which showed an underlying rhythm of: NSR, sinus tachycardia  Social Determinants of Health:  Factors impacting patients care include: none   Reevaluation: After the interventions noted above, I reevaluated the patient and found that they have :improved  Co morbidities that complicate the patient evaluation  Past Medical History:  Diagnosis Date   Aortic atherosclerosis (HCC)    Chronic foot pain, left    History of hematuria    Hypertension    Other emphysema (HCC)    Other male erectile dysfunction    Pre-diabetes    Pure hypercholesterolemia    Tobacco dependence       Dispostion: I considered admission for this patient, but at this time he does not meet inpatient criteria for admission and will be discharged with outpatient follow-up     Final Clinical Impression(s) / ED Diagnoses Final diagnoses:  Influenza A  Acute cystitis without hematuria     @PCDICTATION @    Glendora Score, MD 07/18/23 1306

## 2023-07-19 ENCOUNTER — Other Ambulatory Visit: Payer: Self-pay

## 2023-07-19 LAB — URINE CULTURE

## 2023-07-20 ENCOUNTER — Inpatient Hospital Stay: Payer: Medicare Other

## 2023-07-20 ENCOUNTER — Inpatient Hospital Stay: Payer: Medicare Other | Admitting: Internal Medicine

## 2023-07-22 LAB — CULTURE, BLOOD (ROUTINE X 2)
Culture: NO GROWTH
Culture: NO GROWTH

## 2023-07-30 ENCOUNTER — Other Ambulatory Visit: Payer: Self-pay

## 2023-08-03 ENCOUNTER — Inpatient Hospital Stay: Payer: Medicare Other

## 2023-08-03 ENCOUNTER — Inpatient Hospital Stay: Payer: Medicare Other | Admitting: Internal Medicine

## 2023-08-04 ENCOUNTER — Other Ambulatory Visit: Payer: Self-pay

## 2023-08-07 ENCOUNTER — Other Ambulatory Visit: Payer: Self-pay

## 2023-08-07 ENCOUNTER — Inpatient Hospital Stay: Payer: Medicare Other | Attending: Internal Medicine

## 2023-08-07 ENCOUNTER — Encounter: Payer: Self-pay | Admitting: Internal Medicine

## 2023-08-07 ENCOUNTER — Inpatient Hospital Stay (HOSPITAL_BASED_OUTPATIENT_CLINIC_OR_DEPARTMENT_OTHER): Payer: Medicare Other | Admitting: Internal Medicine

## 2023-08-07 VITALS — BP 118/76 | HR 85 | Temp 97.6°F | Resp 16 | Wt 144.0 lb

## 2023-08-07 DIAGNOSIS — D509 Iron deficiency anemia, unspecified: Secondary | ICD-10-CM | POA: Diagnosis not present

## 2023-08-07 DIAGNOSIS — C779 Secondary and unspecified malignant neoplasm of lymph node, unspecified: Secondary | ICD-10-CM | POA: Diagnosis not present

## 2023-08-07 DIAGNOSIS — Z95828 Presence of other vascular implants and grafts: Secondary | ICD-10-CM

## 2023-08-07 DIAGNOSIS — C679 Malignant neoplasm of bladder, unspecified: Secondary | ICD-10-CM

## 2023-08-07 DIAGNOSIS — D649 Anemia, unspecified: Secondary | ICD-10-CM | POA: Diagnosis not present

## 2023-08-07 LAB — CMP (CANCER CENTER ONLY)
ALT: 14 U/L (ref 0–44)
AST: 14 U/L — ABNORMAL LOW (ref 15–41)
Albumin: 2.7 g/dL — ABNORMAL LOW (ref 3.5–5.0)
Alkaline Phosphatase: 63 U/L (ref 38–126)
Anion gap: 9 (ref 5–15)
BUN: 47 mg/dL — ABNORMAL HIGH (ref 8–23)
CO2: 18 mmol/L — ABNORMAL LOW (ref 22–32)
Calcium: 8.4 mg/dL — ABNORMAL LOW (ref 8.9–10.3)
Chloride: 111 mmol/L (ref 98–111)
Creatinine: 2.32 mg/dL — ABNORMAL HIGH (ref 0.61–1.24)
GFR, Estimated: 30 mL/min — ABNORMAL LOW (ref >60)
Glucose, Bld: 121 mg/dL — ABNORMAL HIGH (ref 70–99)
Potassium: 4.3 mmol/L (ref 3.5–5.1)
Sodium: 138 mmol/L (ref 135–145)
Total Bilirubin: 0.3 mg/dL (ref 0.0–1.2)
Total Protein: 6.2 g/dL — ABNORMAL LOW (ref 6.5–8.1)

## 2023-08-07 LAB — CBC WITH DIFFERENTIAL (CANCER CENTER ONLY)
Abs Immature Granulocytes: 0.38 10*3/uL — ABNORMAL HIGH (ref 0.00–0.07)
Basophils Absolute: 0 10*3/uL (ref 0.0–0.1)
Basophils Relative: 0 %
Eosinophils Absolute: 0.4 10*3/uL (ref 0.0–0.5)
Eosinophils Relative: 3 %
HCT: 26.9 % — ABNORMAL LOW (ref 39.0–52.0)
Hemoglobin: 8.8 g/dL — ABNORMAL LOW (ref 13.0–17.0)
Immature Granulocytes: 3 %
Lymphocytes Relative: 14 %
Lymphs Abs: 1.8 10*3/uL (ref 0.7–4.0)
MCH: 32.1 pg (ref 26.0–34.0)
MCHC: 32.7 g/dL (ref 30.0–36.0)
MCV: 98.2 fL (ref 80.0–100.0)
Monocytes Absolute: 1.1 10*3/uL — ABNORMAL HIGH (ref 0.1–1.0)
Monocytes Relative: 9 %
Neutro Abs: 9.2 10*3/uL — ABNORMAL HIGH (ref 1.7–7.7)
Neutrophils Relative %: 71 %
Platelet Count: 416 10*3/uL — ABNORMAL HIGH (ref 150–400)
RBC: 2.74 MIL/uL — ABNORMAL LOW (ref 4.22–5.81)
RDW: 13.1 % (ref 11.5–15.5)
WBC Count: 13 10*3/uL — ABNORMAL HIGH (ref 4.0–10.5)
nRBC: 0 % (ref 0.0–0.2)

## 2023-08-07 LAB — IRON AND TIBC
Iron: 39 ug/dL — ABNORMAL LOW (ref 45–182)
Saturation Ratios: 22 % (ref 17.9–39.5)
TIBC: 179 ug/dL — ABNORMAL LOW (ref 250–450)
UIBC: 140 ug/dL

## 2023-08-07 LAB — FERRITIN: Ferritin: 386 ng/mL — ABNORMAL HIGH (ref 24–336)

## 2023-08-07 LAB — MAGNESIUM: Magnesium: 2 mg/dL (ref 1.7–2.4)

## 2023-08-07 MED ORDER — SODIUM CHLORIDE 0.9% FLUSH
10.0000 mL | Freq: Once | INTRAVENOUS | Status: AC
  Administered 2023-08-07: 10 mL via INTRAVENOUS
  Filled 2023-08-07: qty 10

## 2023-08-07 MED ORDER — HEPARIN SOD (PORK) LOCK FLUSH 100 UNIT/ML IV SOLN
500.0000 [IU] | Freq: Once | INTRAVENOUS | Status: AC
  Administered 2023-08-07: 500 [IU] via INTRAVENOUS
  Filled 2023-08-07: qty 5

## 2023-08-07 NOTE — Progress Notes (Signed)
 DISCONTINUE ON PATHWAY REGIMEN - Bladder     A cycle is every 14 days:     Methotrexate      Vinblastine      Doxorubicin      Cisplatin      Pegfilgrastim-xxxx   **Always confirm dose/schedule in your pharmacy ordering system**  PRIOR TREATMENT: [id number]: Dose-Dense MVAC q14 Days x 3-4 Cycles  START ON PATHWAY REGIMEN - Bladder     A cycle is every 14 days:     Nivolumab   **Always confirm dose/schedule in your pharmacy ordering system**  Patient Characteristics: Post-Cystectomy with Neoadjuvant Therapy, M0 (Neoadjuvant Pathologic Staging), ypT2-T4b or ypN+, M0 Therapeutic Status: Post-Cystectomy with Neoadjuvant Therapy, M0 (Neoadjuvant Pathologic Staging) AJCC N Category: ypN2 AJCC M Category: cM0 AJCC 8 Stage Grouping: IIIB AJCC T Category: ypT2 Intent of Therapy: Curative Intent, Discussed with Patient

## 2023-08-07 NOTE — Progress Notes (Signed)
 [facility] CONSULT NOTE  Patient Care Team: Marisue Ivan, MD as PCP - General (Family Medicine) Michaelyn Barter, MD as Consulting Physician (Oncology)   CANCER STAGING   Cancer Staging  Bladder cancer [facility]) Staging form: Urinary Bladder, AJCC 8th Edition - Clinical stage from 12/07/2022: Stage II (cT2, cN0, cM0) - Signed by Michaelyn Barter, MD on 01/02/2023 Stage prefix: Initial diagnosis WHO/ISUP grade (low/high): High Grade Histologic grading system: 2 grade system   ASSESSMENT & PLAN:  Ryan Dixon 68 y.o. male with pmh of HTN, HLD, current smoker referred to Medical Oncology for management of bladder cancer.   # Bladder cancer, high grade, cT2 N0 M0 -Was seen by Dr. Lonna Cobb for intermittent hematuria.  Underwent cystoscopy with biopsy of mass close to right ureteral orifice, 2 masses from the bladder neck.  All 3 biopsy showed invasive high-grade urothelial carcinoma involving muscularis propria.  -Completed 4 cycles of neoadjuvant dose dense MVAC on 04/06/2023.    -Status post cystoprostatectomy with conduit diversion with ureter-enteric anastomosis of bilateral single ureters on 07/07/2023 by Dr. Berneice Heinrich.  Inguinal hernia was also repaired the time of cystectomy.  -Pathology showed invasive high-grade urothelial cancer invades superficial muscularis propria margins negative.  2/14 lymph nodes positive for metastatic disease. ypT2a, ypN2  -Discussed about adjuvant nivolumab every 2 weeks for 1 year based on Checkmate 274 trial.  - Results A total of 353 patients were assigned to receive nivolumab and 356 to receive placebo. The median disease-free survival in the intention-to-treat population was 20.8 months (95% confidence interval [CI], 16.5 to 27.6) with nivolumab and 10.8 months (95% CI, 8.3 to 13.9) with placebo. The percentage of patients who were alive and disease-free at 6 months was 74.9% with nivolumab and 60.3% with placebo (hazard ratio for disease  recurrence or death, 0.70; 98.22% CI, 0.55 to 0.90; P<0.001). Among patients with a PD-L1 expression level of 1% or more, the percentage of patients was 74.5% and 55.7%, respectively (hazard ratio, 0.55; 98.72% CI, 0.35 to 0.85; P<0.001). The median survival free from recurrence outside the urothelial tract in the intention-to-treat population was 22.9 months (95% CI, 19.2 to 33.4) with nivolumab and 13.7 months (95% CI, 8.4 to 20.3) with placebo. The percentage of patients who were alive and free from recurrence outside the urothelial tract at 6 months was 77.0% with nivolumab and 62.7% with placebo (hazard ratio for recurrence outside the urothelial tract or death, 0.72; 95% CI, 0.59 to 0.89). Among patients with a PD-L1 expression level of 1% or more, the percentage of patients was 75.3% and 56.7%, respectively (hazard ratio, 0.55; 95% CI, 0.39 to 0.79). Treatment-related adverse events of grade 3 or higher occurred in 17.9% of the nivolumab group and 7.2% of the placebo group. Two treatment-related deaths due to pneumonitis and one treatment-related death due to bowel perforation were noted in the nivolumab group. Conclusions In this trial involving patients with high-risk muscle-invasive urothelial carcinoma who had undergone radical surgery, disease-free survival was longer with adjuvant nivolumab than with placebo in the intention-to-treat population and among patients with a PD-L1 expression level of 1% or more. (Funded by [employer] and [employer]; [employer]ar[employer] manager.gov number, WGN56213086.)  -Patient is agreeable with the plan.  Currently he is still recovering from the surgery.  Scheduled for stent removal on Thursday and will need another removal 2 weeks later.  Will plan to start nivolumab tentatively March 28.  # CKD -Developed platinum induced nephrotoxicity post completion of cycle 4 dose  dense MVAC. -Baseline creatinine 1.5-1.6.  Today it has been 2.3.  He was in  the emergency room about 3 weeks with fevers and chills.  CT abdomen pelvis showed surgery related changes.  No concerns for stent blockage.  Will monitor for now.  # Normocytic anemia -Check iron panel today  # Hypertension- resume hydrochlorothiazide 12.5 mg daily.  Monitor BP at home.  #HLD- lipitor  #Bilateral common iliac artery occlusion - detected on CT imaging done for bladder cancer staging - Referral to Vascular surgery made.   Orders Placed This Encounter  Procedures   Ferritin    Standing Status:   Future    Number of Occurrences:   1    Expected Date:   08/07/2023    Expiration Date:   08/06/2024   Iron and TIBC([facility])    Standing Status:   Future    Number of Occurrences:   1    Expected Date:   08/07/2023    Expiration Date:   08/06/2024   CBC with Differential (Cancer Center Only)    Standing Status:   Future    Expected Date:   08/31/2023    Expiration Date:   08/30/2024   CMP (Cancer Center only)    Standing Status:   Future    Expected Date:   08/31/2023    Expiration Date:   08/30/2024   T4    Standing Status:   Future    Expected Date:   08/31/2023    Expiration Date:   08/30/2024   TSH    Standing Status:   Future    Expected Date:   08/31/2023    Expiration Date:   08/30/2024    The total time spent in the appointment was 30 minutes encounter with patients including review of chart and various tests results, discussions about plan of care and coordination of care plan   All questions were answered. The patient knows to call the clinic with any problems, questions or concerns. No barriers to learning was detected.  Michaelyn Barter, MD 2/24/202511:40 AM   HISTORY OF PRESENTING ILLNESS:  Ryan Dixon 68 y.o. male with pmh of HTN, HLD, current smoker referred to Medical Oncology for management of bladder cancer.   01/2023 - Within 1 week of initiation of cycle 1 of ddMVAC, patient developed bilateral constant tinnitus and decreased hearing with  high-pitched sound. Nephrotoxicity with creatinine peaking at 1.6.  Supported with IV fluids. Evaluated by Dr. Jenne Campus, ENT.  Diagnosed with sensorineural hearing loss and was advised hearing aid.  Chemotherapy was briefly placed on hold. He likely had some baseline hearing issues which was exacerbated by platinum chemotherapy.  ENT was okay to continue with chemotherapy.  Risk and benefit of continuation of platinum based chemo with worsening ototoxicity was discussed with the patient.  Patient understands and would like to proceed.  Nephrotoxicity was completely resolved with IV fluids.  -Switched to [facility] split dose cisplatin on day 1 and day 2 with cycle 2.   -After completion of cycle 4, creatinine again increased to 1.5.   Interval history Patient was seen today accompanied with wife to discuss about adjuvant treatment postsurgery. Has been feeling drained postsurgery.  Energy level is low.  Has lost weight.  Appetite is coming back.  Since surgery, he is having diarrhea at least 4-5 episodes a day.  For the past 2 days thinks it has been better.  Scheduled to see Dr. Berneice Heinrich again on Thursday for stent removal.  Denies  any flank pain.  I have reviewed his chart and materials related to his cancer extensively and collaborated history with the patient. Summary of oncologic history is as follows: Oncology History  Bladder cancer (HCC)  10/25/2022 Initial Diagnosis   Seen by Dr. Lonna Cobb for intermittent hematuria since February 2024 along with other urinary symptoms.   10/28/2022 Imaging   CT hematuria workup Showed 2.2 cm bladder mass worrisome for cancer.  Adjacent mild diffuse bladder wall thickening.  Based on the location this may involve the extreme distal aspect of the right UVJ.  Extensive atherosclerotic changes with the occlusion of the right common iliac artery and areas of significant stenosis along the left. Please correlate with any symptoms such as claudication and further  workup as clinically appropriate.  CT chest with contrast IMPRESSION: 1. Lung-RADS 2, benign appearance or behavior. Continue annual screening with low-dose chest CT without contrast in 12 months. 2. Similar ascending aortic dilatation at 4.2 cm. Recommend attention on follow-up lung cancer screening CT in 1 year.   11/29/2022 Procedure   Intraoperative findings:  Cystoscopy-urethra normal in caliber without stricture.  Prostate with mild lateral lobe enlargement and moderate bladder neck elevation.  UOs normal-appearing bilaterally.  The right UO was anterior to the bladder tumor Bladder tumor: 3 cm papillary/nodular tumor superior to right UO and extending laterally; ~ 1 cm papillary tumor anterior bladder neck 10:00; 2-1 cm papillary tumors anterior bladder neck 2:00    11/29/2022 Pathology Results        12/07/2022 Cancer Staging   Staging form: Urinary Bladder, AJCC 8th Edition - Clinical stage from 12/07/2022: Stage II (cT2, cN0, cM0) - Signed by Michaelyn Barter, MD on 01/02/2023 Stage prefix: Initial diagnosis WHO/ISUP grade (low/high): High Grade Histologic grading system: 2 grade system   01/02/2023 Initial Diagnosis   Bladder cancer (HCC)   01/23/2023 - 04/06/2023 Chemotherapy   Patient is on Treatment Plan : BLADDER DOSE DENSE MVAC q14d     08/31/2023 -  Chemotherapy   Patient is on Treatment Plan : BLADDER Nivolumab (240) q14d       MEDICAL HISTORY:  Past Medical History:  Diagnosis Date   Aortic atherosclerosis (HCC)    Chronic foot pain, left    History of hematuria    Hypertension    Other emphysema (HCC)    Other male erectile dysfunction    Pre-diabetes    Pure hypercholesterolemia    Tobacco dependence     SURGICAL HISTORY: Past Surgical History:  Procedure Laterality Date   ANKLE ARTHROSCOPY Left 08/11/2020   Procedure: LEFT ANKLE ARTHROSCOPY AND DEBRIDEMENT;  Surgeon: Nadara Mustard, MD;  Location: [facility];  Service: Orthopedics;   Laterality: Left;   BLADDER INSTILLATION N/A 11/29/2022   Procedure: BLADDER INSTILLATION OF GEMCITABINE;  Surgeon: [name], MD;  Location: [facility];  Service: Urology;  Laterality: N/A;   COLONOSCOPY     CYSTOSCOPY WITH INJECTION N/A 07/07/2023   Procedure: CYSTOSCOPY WITH INJECTION;  Surgeon: [name]., MD;  Location: [facility];  Service: Urology;  Laterality: N/A;   IR IMAGING GUIDED PORT INSERTION  01/20/2023   PELVIC LYMPH NODE DISSECTION N/A 07/07/2023   Procedure: PELVIC LYMPH NODE DISSECTION;  Surgeon: [name]., MD;  Location: [facility];  Service: Urology;  Laterality: N/A;   ROBOT ASSISTED LAPAROSCOPIC COMPLETE CYSTECT ILEAL CONDUIT N/A 07/07/2023   Procedure: XI ROBOTIC ASSISTED LAPAROSCOPIC COMPLETE CYSTECT ILEAL CONDUIT, UMBILICAL HERNIA REPAIR, BILATERAL INGUINAL HERNIA REPAIR;  Surgeon:  [name]., MD;  Location: [facility];  Service: Urology;  Laterality: N/A;   ROBOT ASSISTED LAPAROSCOPIC RADICAL PROSTATECTOMY N/A 07/07/2023   Procedure: XI ROBOTIC ASSISTED LAPAROSCOPIC RADICAL PROSTATECTOMY;  Surgeon: [name]., MD;  Location: [facility];  Service: Urology;  Laterality: N/A;   TONSILLECTOMY     TRANSURETHRAL RESECTION OF BLADDER TUMOR N/A 11/29/2022   Procedure: TRANSURETHRAL RESECTION OF BLADDER TUMOR (TURBT);  Surgeon: [name], MD;  Location: [facility];  Service: Urology;  Laterality: N/A;    SOCIAL HISTORY: Social History   Socioeconomic History   Marital status: Married    Spouse name: Albin Felling   Number of children: Not on file   Years of education: Not on file   Highest education level: Not on file  Occupational History   Not on file  Tobacco Use   Smoking status: Every Day    Current packs/day: 2.00    Average packs/day: 2.0 packs/day for 43.0 years (86.0 ttl pk-yrs)    Types: Cigarettes   Smokeless tobacco: Never  Substance and Sexual Activity   Alcohol use: Yes    Comment: 1-2 beers/day   Drug use: Never   Sexual  activity: Not on file  Other Topics Concern   Not on file  Social History Narrative   Not on file   Social Drivers of Health   Financial Resource Strain: Low Risk  (02/08/2023)   Received [facility]   Overall Financial Resource Strain (CARDIA)    Difficulty of Paying Living Expenses: Not hard at all  Food Insecurity: No Food Insecurity (07/06/2023)   Hunger Vital Sign    Worried About Running Out of Food in the Last Year: Never true    Ran Out of Food in the Last Year: Never true  Transportation Needs: No Transportation Needs (07/06/2023)   PRAPARE - Administrator, Civil Service (Medical): No    Lack of Transportation (Non-Medical): No  Physical Activity: Not on file  Stress: No Stress Concern Present (01/25/2023)   [employer] of Occupational Health - Occupational Stress Questionnaire    Feeling of Stress : Not at all  Social Connections: Moderately Integrated (07/06/2023)   Social Connection and Isolation Panel [NHANES]    Frequency of Communication with Friends and Family: More than three times a week    Frequency of Social Gatherings with Friends and Family: Once a week    Attends Religious Services: More than 4 times per year    Active Member of [employer] or Organizations: No    Attends Banker Meetings: Never    Marital Status: Married  Catering manager Violence: Not At Risk (07/06/2023)   Humiliation, Afraid, Rape, and Kick questionnaire    Fear of Current or Ex-Partner: No    Emotionally Abused: No    Physically Abused: No    Sexually Abused: No    FAMILY HISTORY: History reviewed. No pertinent family history.  ALLERGIES:  is allergic to nsaids and other.  MEDICATIONS:  Current Outpatient Medications  Medication Sig Dispense Refill   atorvastatin (LIPITOR) 20 MG tablet TAKE 1 TABLET BY MOUTH EVERY DAY AT NIGHT     hydrochlorothiazide (HYDRODIURIL) 25 MG tablet Take 25 mg by mouth daily.     oxyCODONE (ROXICODONE) 5  MG immediate release tablet Take 1 tablet (5 mg total) by mouth every 6 (six) hours as needed for moderate pain (pain score 4-6) or severe pain (pain score 7-10) (post-operatively). 15 tablet 0  senna-docusate (SENOKOT-S) 8.6-50 MG tablet Take 1 tablet by mouth 2 (two) times daily. While taking strong pain meds to prevent constipation. 10 tablet 0   varenicline (CHANTIX) 1 MG tablet Take 1 mg by mouth 2 (two) times daily.     Wheat Dextrin (BENEFIBER) POWD Take 0.5 Scoops by mouth daily.     No current facility-administered medications for this visit.    REVIEW OF SYSTEMS:   Pertinent information mentioned in HPI All other systems were reviewed with the patient and are negative.  PHYSICAL EXAMINATION: ECOG PERFORMANCE STATUS: 0 - Asymptomatic  Vitals:   08/07/23 1019  BP: 118/76  Pulse: 85  Resp: 16  Temp: 97.6 F (36.4 C)  SpO2: 100%   Filed Weights   08/07/23 1019  Weight: 144 lb (65.3 kg)    GENERAL:alert, no distress and comfortable SKIN: skin color, texture, turgor are normal, no rashes or significant lesions EYES: normal, conjunctiva are pink and non-injected, sclera clear OROPHARYNX:no exudate, no erythema and lips, buccal mucosa, and tongue normal  NECK: supple, thyroid normal size, non-tender, without nodularity LYMPH:  no palpable lymphadenopathy in the cervical, axillary or inguinal LUNGS: clear to auscultation and percussion with normal breathing effort HEART: regular rate & rhythm and no murmurs and no lower extremity edema ABDOMEN:abdomen soft, non-tender and normal bowel sounds Musculoskeletal:no cyanosis of digits and no clubbing  PSYCH: alert & oriented x 3 with fluent speech NEURO: no focal motor/sensory deficits  LABORATORY DATA:  I have reviewed the data as listed Lab Results  Component Value Date   WBC 13.0 (H) 08/07/2023   HGB 8.8 (L) 08/07/2023   HCT 26.9 (L) 08/07/2023   MCV 98.2 08/07/2023   PLT 416 (H) 08/07/2023   Recent Labs     07/06/23 2303 07/08/23 0500 07/12/23 0400 07/17/23 1819 08/07/23 0952  NA 135   < > 138 137 138  K 4.0   < > 3.6 3.3* 4.3  CL 104   < > 105 103 111  CO2 22   < > 24 23 18*  GLUCOSE 96   < > 103* 120* 121*  BUN 27*   < > 29* 33* 47*  CREATININE 1.54*   < > 1.84* 2.03* 2.32*  CALCIUM 8.7*   < > 8.5* 8.5* 8.4*  GFRNONAA 50*   < > 40* 36* 30*  PROT 6.5  --   --  7.2 6.2*  ALBUMIN 3.8  --   --  3.7 2.7*  AST 20  --   --  29 14*  ALT 18  --   --  36 14  ALKPHOS 70  --   --  75 63  BILITOT 0.4  --   --  0.5 0.3   < > = values in this interval not displayed.    RADIOGRAPHIC STUDIES: I have personally reviewed the radiological images as listed and agreed with the findings in the report. CT ABDOMEN PELVIS W CONTRAST Result Date: 07/17/2023 CLINICAL DATA:  Sepsis recent surgery fever bladder removal with ostomy pus around stoma EXAM: CT ABDOMEN AND PELVIS WITH CONTRAST TECHNIQUE: Multidetector CT imaging of the abdomen and pelvis was performed using the standard protocol following bolus administration of intravenous contrast. RADIATION DOSE REDUCTION: This exam was performed according to the departmental dose-optimization program which includes automated exposure control, adjustment of the mA and/or kV according to patient size and/or use of iterative reconstruction technique. CONTRAST:  OMNIPAQUE IOHEXOL 300 MG/ML  SOLN COMPARISON:  CT 05/09/2023 FINDINGS:  Lower chest: Lung bases demonstrate no acute airspace disease. Hepatobiliary: No focal liver abnormality is seen. No gallstones, gallbladder wall thickening, or biliary dilatation. Pancreas: Unremarkable. No pancreatic ductal dilatation or surrounding inflammatory changes. Spleen: Normal in size without focal abnormality. Adrenals/Urinary Tract: Adrenal glands are normal. Kidneys show no focal abnormality. Mild right hydronephrosis. Interim placement of bilateral ureteral stents, on the left the pigtail is at the left renal collecting system  and on the right the proximal pigtail is in the proximal right ureter. Stents extend through the ileal conduit and exit at the right abdominal urostomy. Interval cystoprostatectomy with ileal conduit and right abdominal urostomy. No drainable abscess in the vicinity of the right abdominal ostomy. Stomach/Bowel: The stomach is nonenlarged. Some air-filled mildly dilated left mid quadrant small bowel without obstruction, likely ileus. Wall thickening and mucosal enhancement of the cecum and ascending colon with additional mild wall thickening and mucosal enhancement of the rectosigmoid colon. Negative appendix Vascular/Lymphatic: Advanced aortic atherosclerosis. No aneurysm. Circumferential mural thrombus in the distal aorta with mild to moderate luminal narrowing. Extensive vascular disease of the iliac vessels with short segment chronic occlusion or high-grade stenosis of the right common iliac artery. Moderate severe stenosis of the left common iliac artery as well. Reproductive: Prostatectomy Other: Small volume free fluid in the pelvis. Extensive soft tissue gas within the proximal thighs, and at the anterolateral aspect of the pelvis and abdomen, likely due to recent laparoscopic procedure and surgery. Small inguinal hernias, containing fat on the right and fat and fluid on the left. Gas extending into the inguinal regions and scrotum. Musculoskeletal: No acute or suspicious osseous abnormality. IMPRESSION: 1. Interval cystoprostatectomy with ileal conduit and right abdominal urostomy. Negative for abscess in the region of the right abdominal ostomy. Interim placement of bilateral ureteral stents, proximal pigtail of the right ureteral stent is in the proximal right ureter and there is interval mild right hydronephrosis proximal to the right ureteral stent. 2. Small volume free fluid in the pelvis.  No organized abscess. 3. Extensive soft tissue gas within the proximal thighs, and at the anterolateral aspect of  the pelvis and abdomen, likely due to recent laparoscopic procedure and surgery. 4. Wall thickening and mucosal enhancement of the cecum and ascending colon with additional mild wall thickening and mucosal enhancement of the rectosigmoid colon, findings suggestive of colitis. 5. Mildly dilated air-filled left mid quadrant small bowel without obstruction, likely ileus. 6. Advanced aortic atherosclerosis. Short segment chronic occlusion or high-grade stenosis of the right common iliac artery. Moderate to severe stenosis of the proximal left common iliac artery. Aortic Atherosclerosis (ICD10-I70.0). Electronically Signed   By: Jasmine Pang M.D.   On: 07/17/2023 21:48   DG Chest Port 1 View Result Date: 07/17/2023 CLINICAL DATA:  Question of sepsis to evaluate for abnormality. EXAM: PORTABLE CHEST 1 VIEW COMPARISON:  CT chest abdomen and pelvis 05/09/2023 FINDINGS: Power port type central venous catheter with tip over the low SVC region. Heart size and pulmonary vascularity are normal. Lungs are clear. No pleural effusion or pneumothorax is identified. Mediastinal contours appear intact. Subcutaneous emphysema in the right lateral chest wall. IMPRESSION: 1. Subcutaneous emphysema in the right chest wall. Etiology is nonspecific, possibly prior instrumentation or soft tissue infection. For 2 lungs are clear. No pleural effusions or pneumothorax identified. Electronically Signed   By: Burman Nieves M.D.   On: 07/17/2023 19:20

## 2023-08-07 NOTE — Progress Notes (Signed)
 Patient had a CT scan on 07/17/2023. He says that he is feeling better since coming out of the hospital. His appetite has been low, he has lost about 20 lbs since last October. He is eating better though. He is wanting to discuss the results.

## 2023-08-08 ENCOUNTER — Other Ambulatory Visit: Payer: Self-pay

## 2023-08-12 ENCOUNTER — Other Ambulatory Visit: Payer: Self-pay

## 2023-08-17 NOTE — Progress Notes (Signed)
 Pharmacist Chemotherapy Monitoring - Initial Assessment    Anticipated start date: 08/31/23   The following has been reviewed per standard work regarding the patient's treatment regimen: The patient's diagnosis, treatment plan and drug doses, and organ/hematologic function Lab orders and baseline tests specific to treatment regimen  The treatment plan start date, drug sequencing, and pre-medications Prior authorization status  Patient's documented medication list, including drug-drug interaction screen and prescriptions for anti-emetics and supportive care specific to the treatment regimen The drug concentrations, fluid compatibility, administration routes, and timing of the medications to be used The patient's access for treatment and lifetime cumulative dose history, if applicable  The patient's medication allergies and previous infusion related reactions, if applicable   Changes made to treatment plan:  N/A  Follow up needed:  N/A   Sharen Hones, [facility], 08/17/2023  2:38 PM

## 2023-08-22 ENCOUNTER — Encounter: Payer: Self-pay | Admitting: Internal Medicine

## 2023-08-24 ENCOUNTER — Encounter: Payer: Self-pay | Admitting: Internal Medicine

## 2023-08-24 NOTE — Addendum Note (Signed)
 Addended byMichaelyn Barter on: 08/24/2023 09:44 AM   Modules accepted: Orders

## 2023-08-31 ENCOUNTER — Inpatient Hospital Stay: Payer: Medicare Other

## 2023-08-31 ENCOUNTER — Encounter: Payer: Self-pay | Admitting: Internal Medicine

## 2023-08-31 ENCOUNTER — Inpatient Hospital Stay: Payer: Medicare Other | Attending: Internal Medicine | Admitting: Internal Medicine

## 2023-08-31 VITALS — BP 137/71 | HR 85 | Temp 95.0°F | Resp 18

## 2023-08-31 VITALS — BP 128/68 | HR 100 | Temp 97.8°F | Resp 16 | Wt 144.0 lb

## 2023-08-31 DIAGNOSIS — Z79620 Long term (current) use of immunosuppressive biologic: Secondary | ICD-10-CM | POA: Diagnosis not present

## 2023-08-31 DIAGNOSIS — C679 Malignant neoplasm of bladder, unspecified: Secondary | ICD-10-CM

## 2023-08-31 DIAGNOSIS — C7989 Secondary malignant neoplasm of other specified sites: Secondary | ICD-10-CM | POA: Insufficient documentation

## 2023-08-31 DIAGNOSIS — C779 Secondary and unspecified malignant neoplasm of lymph node, unspecified: Secondary | ICD-10-CM | POA: Insufficient documentation

## 2023-08-31 DIAGNOSIS — F17210 Nicotine dependence, cigarettes, uncomplicated: Secondary | ICD-10-CM | POA: Insufficient documentation

## 2023-08-31 DIAGNOSIS — Z5112 Encounter for antineoplastic immunotherapy: Secondary | ICD-10-CM | POA: Diagnosis present

## 2023-08-31 DIAGNOSIS — T451X5A Adverse effect of antineoplastic and immunosuppressive drugs, initial encounter: Secondary | ICD-10-CM | POA: Insufficient documentation

## 2023-08-31 LAB — CMP (CANCER CENTER ONLY)
ALT: 18 U/L (ref 0–44)
AST: 17 U/L (ref 15–41)
Albumin: 3.3 g/dL — ABNORMAL LOW (ref 3.5–5.0)
Alkaline Phosphatase: 76 U/L (ref 38–126)
Anion gap: 7 (ref 5–15)
BUN: 40 mg/dL — ABNORMAL HIGH (ref 8–23)
CO2: 18 mmol/L — ABNORMAL LOW (ref 22–32)
Calcium: 8.7 mg/dL — ABNORMAL LOW (ref 8.9–10.3)
Chloride: 113 mmol/L — ABNORMAL HIGH (ref 98–111)
Creatinine: 1.75 mg/dL — ABNORMAL HIGH (ref 0.61–1.24)
GFR, Estimated: 43 mL/min — ABNORMAL LOW (ref >60)
Glucose, Bld: 103 mg/dL — ABNORMAL HIGH (ref 70–99)
Potassium: 4 mmol/L (ref 3.5–5.1)
Sodium: 138 mmol/L (ref 135–145)
Total Bilirubin: 0.4 mg/dL (ref 0.0–1.2)
Total Protein: 6.9 g/dL (ref 6.5–8.1)

## 2023-08-31 LAB — CBC WITH DIFFERENTIAL (CANCER CENTER ONLY)
Abs Immature Granulocytes: 0.22 10*3/uL — ABNORMAL HIGH (ref 0.00–0.07)
Basophils Absolute: 0.1 10*3/uL (ref 0.0–0.1)
Basophils Relative: 1 %
Eosinophils Absolute: 0.4 10*3/uL (ref 0.0–0.5)
Eosinophils Relative: 3 %
HCT: 27.1 % — ABNORMAL LOW (ref 39.0–52.0)
Hemoglobin: 8.9 g/dL — ABNORMAL LOW (ref 13.0–17.0)
Immature Granulocytes: 2 %
Lymphocytes Relative: 21 %
Lymphs Abs: 2.2 10*3/uL (ref 0.7–4.0)
MCH: 31.9 pg (ref 26.0–34.0)
MCHC: 32.8 g/dL (ref 30.0–36.0)
MCV: 97.1 fL (ref 80.0–100.0)
Monocytes Absolute: 0.9 10*3/uL (ref 0.1–1.0)
Monocytes Relative: 8 %
Neutro Abs: 7.1 10*3/uL (ref 1.7–7.7)
Neutrophils Relative %: 65 %
Platelet Count: 427 10*3/uL — ABNORMAL HIGH (ref 150–400)
RBC: 2.79 MIL/uL — ABNORMAL LOW (ref 4.22–5.81)
RDW: 14.3 % (ref 11.5–15.5)
WBC Count: 10.8 10*3/uL — ABNORMAL HIGH (ref 4.0–10.5)
nRBC: 0 % (ref 0.0–0.2)

## 2023-08-31 LAB — TSH: TSH: 2.783 u[IU]/mL (ref 0.350–4.500)

## 2023-08-31 MED ORDER — SODIUM CHLORIDE 0.9 % IV SOLN
240.0000 mg | Freq: Once | INTRAVENOUS | Status: AC
  Administered 2023-08-31: 240 mg via INTRAVENOUS
  Filled 2023-08-31: qty 24

## 2023-08-31 MED ORDER — HEPARIN SOD (PORK) LOCK FLUSH 100 UNIT/ML IV SOLN
500.0000 [IU] | Freq: Once | INTRAVENOUS | Status: AC | PRN
  Administered 2023-08-31: 500 [IU]
  Filled 2023-08-31: qty 5

## 2023-08-31 MED ORDER — SODIUM CHLORIDE 0.9 % IV SOLN
INTRAVENOUS | Status: DC
Start: 2023-08-31 — End: 2023-08-31
  Filled 2023-08-31: qty 250

## 2023-08-31 NOTE — Progress Notes (Signed)
 [facility] CONSULT NOTE  Patient Care Team: Marisue Ivan, MD as PCP - General (Family Medicine) Michaelyn Barter, MD as Consulting Physician (Oncology)   CANCER STAGING   Cancer Staging  Bladder cancer [facility]) Staging form: Urinary Bladder, AJCC 8th Edition - Clinical stage from 12/07/2022: Stage II (cT2, cN0, cM0) - Signed by Michaelyn Barter, MD on 01/02/2023 Stage prefix: Initial diagnosis WHO/ISUP grade (low/high): High Grade Histologic grading system: 2 grade system - Pathologic stage from 07/07/2023: Stage IIIB (ypT2a, pN2, cM0) - Signed by Michaelyn Barter, MD on 08/31/2023 Stage prefix: Post-therapy Response to neoadjuvant therapy: Partial response WHO/ISUP grade (low/high): High Grade Histologic grading system: 2 grade system   ASSESSMENT & PLAN:  Ryan Dixon 68 y.o. male with pmh of HTN, HLD, current smoker referred to Medical Oncology for management of bladder cancer.   # Bladder cancer, high grade, pathologic stage IIIb -Was seen by Dr. Lonna Cobb for intermittent hematuria.  Underwent cystoscopy with biopsy of mass close to right ureteral orifice, 2 masses from the bladder neck.  All 3 biopsy showed invasive high-grade urothelial carcinoma involving muscularis propria.  -Completed 4 cycles of neoadjuvant dose dense MVAC on 04/06/2023.  Post completion of 4 cycles, developed platinum induced nephrotoxicity.  -Status post cystoprostatectomy with conduit diversion with ureter-enteric anastomosis of bilateral single ureters on 07/07/2023 by Dr. Berneice Heinrich.  Inguinal hernia was also repaired the time of cystectomy.  -Pathology showed invasive high-grade urothelial cancer invades superficial muscularis propria margins negative.  2/14 lymph nodes positive for metastatic disease. ypT2a, ypN2  -Based on Checkmate 274, on adjuvant nivolumab every 2 weeks for 1 year.  Labs reviewed and acceptable for treatment.  Will proceed with cycle 1 of nivolumab 240 mg.  Follow-up in 2  weeks.  # CKD -Developed platinum induced nephrotoxicity post completion of cycle 4 dose dense MVAC. -Baseline creatinine between 1.5-2.  Had ureteral stent removal by Dr.Manny  # Normocytic anemia -Likely secondary to anemia of chronic disease from CKD. -Iron panel reviewed.  # Hypertension- resume hydrochlorothiazide 12.5 mg daily.  Monitor BP at home.  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  # Tobacco dependence -Has cut down to 5 cigarettes a day.  Has stopped Chantix  # Access-port  Orders Placed This Encounter  Procedures   CBC with Differential (Cancer Center Only)    Standing Status:   Future    Expected Date:   09/19/2023    Expiration Date:   09/18/2024   CMP (Cancer Center only)    Standing Status:   Future    Expected Date:   09/19/2023    Expiration Date:   09/18/2024   RTC in 2 weeks for MD visit, labs, nivolumab  The total time spent in the appointment was 30 minutes encounter with patients including review of chart and various tests results, discussions about plan of care and coordination of care plan   All questions were answered. The patient knows to call the clinic with any problems, questions or concerns. No barriers to learning was detected.  Michaelyn Barter, MD 3/20/202511:19 AM   HISTORY OF PRESENTING ILLNESS:  Ryan Dixon 68 y.o. male with pmh of HTN, HLD, current smoker referred to Medical Oncology for management of bladder cancer.   01/2023 - Within 1 week of initiation of cycle 1 of ddMVAC, patient developed  bilateral constant tinnitus and decreased hearing with high-pitched sound. Nephrotoxicity with creatinine peaking at 1.6.  Supported with IV fluids. Evaluated by Dr. Jenne Campus, ENT.  Diagnosed with sensorineural hearing loss and was advised hearing aid.  Chemotherapy was  briefly placed on hold. He likely had some baseline hearing issues which was exacerbated by platinum chemotherapy.  ENT was okay to continue with chemotherapy.  Risk and benefit of continuation of platinum based chemo with worsening ototoxicity was discussed with the patient.  Patient understands and would like to proceed.  Nephrotoxicity was completely resolved with IV fluids.  -Switched to [facility] split dose cisplatin on day 1 and day 2 with cycle 2.   -After completion of cycle 4, creatinine again increased to 1.5.   Interval history Patient was seen today as follow-up, labs on maintenance nivolumab. He continues to feel better.  Appetite has improved.  Is working about 6 to 7 hours at job every day.  I have reviewed his chart and materials related to his cancer extensively and collaborated history with the patient. Summary of oncologic history is as follows: Oncology History  Bladder cancer (HCC)  10/25/2022 Initial Diagnosis   Seen by Dr. Lonna Cobb for intermittent hematuria since February 2024 along with other urinary symptoms.   10/28/2022 Imaging   CT hematuria workup Showed 2.2 cm bladder mass worrisome for cancer.  Adjacent mild diffuse bladder wall thickening.  Based on the location this may involve the extreme distal aspect of the right UVJ.  Extensive atherosclerotic changes with the occlusion of the right common iliac artery and areas of significant stenosis along the left. Please correlate with any symptoms such as claudication and further workup as clinically appropriate.  CT chest with contrast IMPRESSION: 1. Lung-RADS 2, benign appearance or behavior. Continue annual screening with low-dose chest CT without contrast in 12 months. 2. Similar ascending aortic dilatation at 4.2 cm. Recommend attention on follow-up lung cancer screening CT in 1 year.   11/29/2022 Procedure   Intraoperative findings:  Cystoscopy-urethra normal in caliber without stricture.  Prostate with  mild lateral lobe enlargement and moderate bladder neck elevation.  UOs normal-appearing bilaterally.  The right UO was anterior to the bladder tumor Bladder tumor: 3 cm papillary/nodular tumor superior to right UO and extending laterally; ~ 1 cm papillary tumor anterior bladder neck 10:00; 2-1 cm papillary tumors anterior bladder neck 2:00    11/29/2022 Pathology Results        12/07/2022 Cancer Staging   Staging form: Urinary Bladder, AJCC 8th Edition - Clinical stage from 12/07/2022: Stage II (cT2, cN0, cM0) - Signed by Michaelyn Barter, MD on 01/02/2023 Stage prefix: Initial diagnosis WHO/ISUP grade (low/high): High Grade Histologic grading system: 2 grade system   01/02/2023 Initial Diagnosis   Bladder cancer (HCC)   01/23/2023 - 04/06/2023 Chemotherapy   Patient is on Treatment Plan : BLADDER DOSE DENSE MVAC q14d     07/07/2023 Cancer Staging   Staging form: Urinary Bladder, AJCC 8th Edition - Pathologic stage from 07/07/2023: Stage IIIB (ypT2a, pN2, cM0) - Signed by Michaelyn Barter, MD on 08/31/2023 Stage prefix: Post-therapy Response to neoadjuvant therapy: Partial response WHO/ISUP grade (low/high): High Grade Histologic grading system: 2 grade system   08/31/2023 -  Chemotherapy   Patient is on Treatment Plan : BLADDER Nivolumab (240) q14d       MEDICAL HISTORY:  Past Medical History:  Diagnosis Date   Aortic atherosclerosis (HCC)    Chronic foot pain, left    History of hematuria  Hypertension    Other emphysema (HCC)    Other male erectile dysfunction    Pre-diabetes    Pure hypercholesterolemia    Tobacco dependence     SURGICAL HISTORY: Past Surgical History:  Procedure Laterality Date   ANKLE ARTHROSCOPY Left 08/11/2020   Procedure: LEFT ANKLE ARTHROSCOPY AND DEBRIDEMENT;  Surgeon: Nadara Mustard, MD;  Location: [facility];  Service: Orthopedics;  Laterality: Left;   BLADDER INSTILLATION N/A 11/29/2022   Procedure: BLADDER INSTILLATION OF  GEMCITABINE;  Surgeon: [name], MD;  Location: [facility];  Service: Urology;  Laterality: N/A;   COLONOSCOPY     CYSTOSCOPY WITH INJECTION N/A 07/07/2023   Procedure: CYSTOSCOPY WITH INJECTION;  Surgeon: [name]., MD;  Location: [facility];  Service: Urology;  Laterality: N/A;   IR IMAGING GUIDED PORT INSERTION  01/20/2023   PELVIC LYMPH NODE DISSECTION N/A 07/07/2023   Procedure: PELVIC LYMPH NODE DISSECTION;  Surgeon: [name]., MD;  Location: [facility];  Service: Urology;  Laterality: N/A;   ROBOT ASSISTED LAPAROSCOPIC COMPLETE CYSTECT ILEAL CONDUIT N/A 07/07/2023   Procedure: XI ROBOTIC ASSISTED LAPAROSCOPIC COMPLETE CYSTECT ILEAL CONDUIT, UMBILICAL HERNIA REPAIR, BILATERAL INGUINAL HERNIA REPAIR;  Surgeon: [name]., MD;  Location: [facility];  Service: Urology;  Laterality: N/A;   ROBOT ASSISTED LAPAROSCOPIC RADICAL PROSTATECTOMY N/A 07/07/2023   Procedure: XI ROBOTIC ASSISTED LAPAROSCOPIC RADICAL PROSTATECTOMY;  Surgeon: [name]., MD;  Location: [facility];  Service: Urology;  Laterality: N/A;   TONSILLECTOMY     TRANSURETHRAL RESECTION OF BLADDER TUMOR N/A 11/29/2022   Procedure: TRANSURETHRAL RESECTION OF BLADDER TUMOR (TURBT);  Surgeon: [name], MD;  Location: [facility];  Service: Urology;  Laterality: N/A;    SOCIAL HISTORY: Social History   Socioeconomic History   Marital status: Married    Spouse name: Albin Felling   Number of children: Not on file   Years of education: Not on file   Highest education level: Not on file  Occupational History   Not on file  Tobacco Use   Smoking status: Every Day    Current packs/day: 2.00    Average packs/day: 2.0 packs/day for 43.0 years (86.0 ttl pk-yrs)    Types: Cigarettes   Smokeless tobacco: Never  Substance and Sexual Activity   Alcohol use: Yes    Comment: 1-2 beers/day   Drug use: Never   Sexual activity: Not on file  Other Topics Concern   Not on file  Social History Narrative   Not  on file   Social Drivers of Health   Financial Resource Strain: Low Risk  (02/08/2023)   Received from [facility]   Overall Financial Resource Strain (CARDIA)    Difficulty of Paying Living Expenses: Not hard at all  Food Insecurity: No Food Insecurity (07/06/2023)   Hunger Vital Sign    Worried About Running Out of Food in the Last Year: Never true    Ran Out of Food in the Last Year: Never true  Transportation Needs: No Transportation Needs (07/06/2023)   PRAPARE - Administrator, Civil Service (Medical): No    Lack of Transportation (Non-Medical): No  Physical Activity: Not on file  Stress: No Stress Concern Present (01/25/2023)   [employer] of Occupational Health - Occupational Stress Questionnaire    Feeling of Stress : Not at all  Social Connections: Moderately Integrated (07/06/2023)   Social Connection and Isolation Panel [NHANES]    Frequency  of Communication with Friends and Family: More than three times a week    Frequency of Social Gatherings with Friends and Family: Once a week    Attends Religious Services: More than 4 times per year    Active Member of [employer] or Organizations: No    Attends Banker Meetings: Never    Marital Status: Married  Catering manager Violence: Not At Risk (07/06/2023)   Humiliation, Afraid, Rape, and Kick questionnaire    Fear of Current or Ex-Partner: No    Emotionally Abused: No    Physically Abused: No    Sexually Abused: No    FAMILY HISTORY: History reviewed. No pertinent family history.  ALLERGIES:  is allergic to nsaids and other.  MEDICATIONS:  Current Outpatient Medications  Medication Sig Dispense Refill   atorvastatin (LIPITOR) 20 MG tablet TAKE 1 TABLET BY MOUTH EVERY DAY AT NIGHT     hydrochlorothiazide (HYDRODIURIL) 25 MG tablet Take 25 mg by mouth daily.     oxyCODONE (ROXICODONE) 5 MG immediate release tablet Take 1 tablet (5 mg total) by mouth every 6 (six) hours as needed  for moderate pain (pain score 4-6) or severe pain (pain score 7-10) (post-operatively). 15 tablet 0   senna-docusate (SENOKOT-S) 8.6-50 MG tablet Take 1 tablet by mouth 2 (two) times daily. While taking strong pain meds to prevent constipation. 10 tablet 0   Wheat Dextrin (BENEFIBER) POWD Take 0.5 Scoops by mouth daily.     varenicline (CHANTIX) 1 MG tablet Take 1 mg by mouth 2 (two) times daily. (Patient not taking: Reported on 08/31/2023)     No current facility-administered medications for this visit.   Facility-Administered Medications Ordered in Other Visits  Medication Dose Route Frequency Provider Last Rate Last Admin   0.9 %  sodium chloride infusion   Intravenous Continuous Michaelyn Barter, MD 10 mL/hr at 08/31/23 1114 New Bag at 08/31/23 1114   nivolumab (OPDIVO) 240 mg in sodium chloride 0.9 % 100 mL chemo infusion  240 mg Intravenous Once Michaelyn Barter, MD        REVIEW OF SYSTEMS:   Pertinent information mentioned in HPI All other systems were reviewed with the patient and are negative.  PHYSICAL EXAMINATION: ECOG PERFORMANCE STATUS: 0 - Asymptomatic  Vitals:   08/31/23 0939  BP: 128/68  Pulse: 100  Resp: 16  Temp: 97.8 F (36.6 C)  SpO2: 98%   Filed Weights   08/31/23 0939  Weight: 144 lb (65.3 kg)    GENERAL:alert, no distress and comfortable SKIN: skin color, texture, turgor are normal, no rashes or significant lesions EYES: normal, conjunctiva are pink and non-injected, sclera clear OROPHARYNX:no exudate, no erythema and lips, buccal mucosa, and tongue normal  NECK: supple, thyroid normal size, non-tender, without nodularity LYMPH:  no palpable lymphadenopathy in the cervical, axillary or inguinal LUNGS: clear to auscultation and percussion with normal breathing effort HEART: regular rate & rhythm and no murmurs and no lower extremity edema ABDOMEN:abdomen soft, non-tender and normal bowel sounds Musculoskeletal:no cyanosis of digits and no clubbing   PSYCH: alert & oriented x 3 with fluent speech NEURO: no focal motor/sensory deficits  LABORATORY DATA:  I have reviewed the data as listed Lab Results  Component Value Date   WBC 10.8 (H) 08/31/2023   HGB 8.9 (L) 08/31/2023   HCT 27.1 (L) 08/31/2023   MCV 97.1 08/31/2023   PLT 427 (H) 08/31/2023   Recent Labs    07/17/23 1819 08/07/23 0952 08/31/23 0918  NA 137  138 138  K 3.3* 4.3 4.0  CL 103 111 113*  CO2 23 18* 18*  GLUCOSE 120* 121* 103*  BUN 33* 47* 40*  CREATININE 2.03* 2.32* 1.75*  CALCIUM 8.5* 8.4* 8.7*  GFRNONAA 36* 30* 43*  PROT 7.2 6.2* 6.9  ALBUMIN 3.7 2.7* 3.3*  AST 29 14* 17  ALT 36 14 18  ALKPHOS 75 63 76  BILITOT 0.5 0.3 0.4    RADIOGRAPHIC STUDIES: I have personally reviewed the radiological images as listed and agreed with the findings in the report. No results found.

## 2023-08-31 NOTE — Patient Instructions (Signed)

## 2023-08-31 NOTE — Progress Notes (Signed)
 Patient is doing well, he has no new questions or concerns for the doctor today. He is still trying to quit smoking, he stopped using the chantix, but he is down to about 5 cigarettes a day.

## 2023-09-01 ENCOUNTER — Other Ambulatory Visit: Payer: Self-pay

## 2023-09-01 LAB — T4: T4, Total: 6.5 ug/dL (ref 4.5–12.0)

## 2023-09-04 ENCOUNTER — Other Ambulatory Visit: Payer: Self-pay

## 2023-09-19 ENCOUNTER — Encounter: Payer: Self-pay | Admitting: Internal Medicine

## 2023-09-19 ENCOUNTER — Inpatient Hospital Stay

## 2023-09-19 ENCOUNTER — Inpatient Hospital Stay (HOSPITAL_BASED_OUTPATIENT_CLINIC_OR_DEPARTMENT_OTHER): Admitting: Internal Medicine

## 2023-09-19 ENCOUNTER — Inpatient Hospital Stay: Attending: Internal Medicine

## 2023-09-19 VITALS — BP 125/71 | HR 89 | Temp 98.7°F | Resp 18 | Wt 151.0 lb

## 2023-09-19 DIAGNOSIS — C679 Malignant neoplasm of bladder, unspecified: Secondary | ICD-10-CM | POA: Insufficient documentation

## 2023-09-19 DIAGNOSIS — C7989 Secondary malignant neoplasm of other specified sites: Secondary | ICD-10-CM | POA: Diagnosis not present

## 2023-09-19 DIAGNOSIS — F17210 Nicotine dependence, cigarettes, uncomplicated: Secondary | ICD-10-CM | POA: Diagnosis not present

## 2023-09-19 DIAGNOSIS — L509 Urticaria, unspecified: Secondary | ICD-10-CM | POA: Diagnosis not present

## 2023-09-19 DIAGNOSIS — Z5112 Encounter for antineoplastic immunotherapy: Secondary | ICD-10-CM | POA: Diagnosis present

## 2023-09-19 DIAGNOSIS — C779 Secondary and unspecified malignant neoplasm of lymph node, unspecified: Secondary | ICD-10-CM | POA: Insufficient documentation

## 2023-09-19 DIAGNOSIS — Z79620 Long term (current) use of immunosuppressive biologic: Secondary | ICD-10-CM | POA: Diagnosis not present

## 2023-09-19 LAB — CBC WITH DIFFERENTIAL (CANCER CENTER ONLY)
Abs Immature Granulocytes: 0.06 10*3/uL (ref 0.00–0.07)
Basophils Absolute: 0.1 10*3/uL (ref 0.0–0.1)
Basophils Relative: 1 %
Eosinophils Absolute: 0.4 10*3/uL (ref 0.0–0.5)
Eosinophils Relative: 4 %
HCT: 27.6 % — ABNORMAL LOW (ref 39.0–52.0)
Hemoglobin: 8.9 g/dL — ABNORMAL LOW (ref 13.0–17.0)
Immature Granulocytes: 1 %
Lymphocytes Relative: 18 %
Lymphs Abs: 1.4 10*3/uL (ref 0.7–4.0)
MCH: 31.1 pg (ref 26.0–34.0)
MCHC: 32.2 g/dL (ref 30.0–36.0)
MCV: 96.5 fL (ref 80.0–100.0)
Monocytes Absolute: 0.9 10*3/uL (ref 0.1–1.0)
Monocytes Relative: 12 %
Neutro Abs: 5.1 10*3/uL (ref 1.7–7.7)
Neutrophils Relative %: 64 %
Platelet Count: 355 10*3/uL (ref 150–400)
RBC: 2.86 MIL/uL — ABNORMAL LOW (ref 4.22–5.81)
RDW: 15.1 % (ref 11.5–15.5)
WBC Count: 7.9 10*3/uL (ref 4.0–10.5)
nRBC: 0 % (ref 0.0–0.2)

## 2023-09-19 LAB — CMP (CANCER CENTER ONLY)
ALT: 16 U/L (ref 0–44)
AST: 20 U/L (ref 15–41)
Albumin: 3.7 g/dL (ref 3.5–5.0)
Alkaline Phosphatase: 90 U/L (ref 38–126)
Anion gap: 9 (ref 5–15)
BUN: 32 mg/dL — ABNORMAL HIGH (ref 8–23)
CO2: 18 mmol/L — ABNORMAL LOW (ref 22–32)
Calcium: 8.8 mg/dL — ABNORMAL LOW (ref 8.9–10.3)
Chloride: 111 mmol/L (ref 98–111)
Creatinine: 1.84 mg/dL — ABNORMAL HIGH (ref 0.61–1.24)
GFR, Estimated: 40 mL/min — ABNORMAL LOW (ref >60)
Glucose, Bld: 120 mg/dL — ABNORMAL HIGH (ref 70–99)
Potassium: 4.4 mmol/L (ref 3.5–5.1)
Sodium: 138 mmol/L (ref 135–145)
Total Bilirubin: 0.5 mg/dL (ref 0.0–1.2)
Total Protein: 7 g/dL (ref 6.5–8.1)

## 2023-09-19 MED ORDER — TRIAMCINOLONE ACETONIDE 0.1 % EX CREA: 1.0000 | TOPICAL_CREAM | Freq: Two times a day (BID) | CUTANEOUS | 0 refills | Status: AC

## 2023-09-19 MED ORDER — HEPARIN SOD (PORK) LOCK FLUSH 100 UNIT/ML IV SOLN
500.0000 [IU] | Freq: Once | INTRAVENOUS | Status: AC
Start: 2023-09-19 — End: ?
  Filled 2023-09-19: qty 5

## 2023-09-19 NOTE — Patient Instructions (Signed)
 Please start Claritin 10 mg once daily for itching.   Please apply triamcinolone cream twice a day to affected areas for 7-10 days.

## 2023-09-19 NOTE — Progress Notes (Signed)
 [facility] CONSULT NOTE  Patient Care Team: Marisue Ivan, MD as PCP - General (Family Medicine) Ryan Barter, MD as Consulting Physician (Oncology)   CANCER STAGING   Cancer Staging  Bladder cancer [facility]) Staging form: Urinary Bladder, AJCC 8th Edition - Clinical stage from 12/07/2022: Stage II (cT2, cN0, cM0) - Signed by Ryan Barter, MD on 01/02/2023 Stage prefix: Initial diagnosis WHO/ISUP grade (low/high): High Grade Histologic grading system: 2 grade system - Pathologic stage from 07/07/2023: Stage IIIB (ypT2a, pN2, cM0) - Signed by Ryan Barter, MD on 08/31/2023 Stage prefix: Post-therapy Response to neoadjuvant therapy: Partial response WHO/ISUP grade (low/high): High Grade Histologic grading system: 2 grade system   ASSESSMENT & PLAN:  Ryan Dixon 68 y.o. male with pmh of HTN, HLD, current smoker referred to Medical Oncology for management of bladder cancer.   # Bladder cancer, high grade, pathologic stage IIIb -Was seen by Dr. Lonna Cobb for intermittent hematuria.  Underwent cystoscopy with biopsy of mass close to right ureteral orifice, 2 masses from the bladder neck.  All 3 biopsy showed invasive high-grade urothelial carcinoma involving muscularis propria.  -Completed 4 cycles of neoadjuvant dose dense MVAC on 04/06/2023.  Post completion of 4 cycles, developed platinum induced nephrotoxicity.  -Status post cystoprostatectomy with conduit diversion with ureter-enteric anastomosis of bilateral single ureters on 07/07/2023 by Dr. Berneice Heinrich.  Inguinal hernia was also repaired the time of cystectomy.  -Pathology showed invasive high-grade urothelial cancer invades superficial muscularis propria margins negative.  2/14 lymph nodes positive for metastatic disease. ypT2a, ypN2  -Based on Checkmate 274, on adjuvant nivolumab for 1 year.  Nivolumab maintenance started on 09/05/2023.  A day after the first nivolumab infusion, patient broke out into diffuse rash  on the bilateral arms, legs, thighs and on back.  Associated with itching.  Is not taking anything.  Advised to take Claritin 10 mg once daily.  Start on triamcinolone cream 0.1% twice a day on the affected areas for 7 days.  Will hold nivolumab today until has improvement in his symptoms.  I have added Cetirizine as a premedication prior to future nivolumab infusions.  # CKD -Developed platinum induced nephrotoxicity post completion of cycle 4 dose dense MVAC. -Baseline creatinine between 1.5-2.  Had ureteral stent removal by Dr.Manny  # Normocytic anemia -Likely secondary to anemia of chronic disease from CKD. -Iron panel reviewed.  # Hypertension- resume hydrochlorothiazide 12.5 mg daily.  Monitor BP at home.  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  # Tobacco dependence -Has cut down to 5 cigarettes a day.  Has stopped Chantix  # Access-port  Orders Placed This Encounter  Procedures   CBC with Differential (Cancer Center Only)    Standing Status:   Future    Expected Date:   09/26/2023    Expiration Date:   09/25/2024   CMP (Cancer Center only)    Standing Status:   Future    Expected Date:   09/26/2023    Expiration Date:   09/25/2024   RTC in 1 weeks for MD visit, labs, nivolumab  The total time spent in the appointment was 30 minutes encounter with patients including review of chart and various tests results, discussions about plan of care and coordination of care plan   All questions were answered. The patient knows to call the clinic with  any problems, questions or concerns. No barriers to learning was detected.  Ryan Barter, MD 4/8/202510:08 AM   HISTORY OF PRESENTING ILLNESS:  Ryan Dixon 68 y.o. male with pmh of HTN, HLD, current smoker referred to Medical Oncology for management of  bladder cancer.   01/2023 - Within 1 week of initiation of cycle 1 of ddMVAC, patient developed bilateral constant tinnitus and decreased hearing with high-pitched sound. Nephrotoxicity with creatinine peaking at 1.6.  Supported with IV fluids. Evaluated by Dr. Jenne Campus, ENT.  Diagnosed with sensorineural hearing loss and was advised hearing aid.  Chemotherapy was briefly placed on hold. He likely had some baseline hearing issues which was exacerbated by platinum chemotherapy.  ENT was okay to continue with chemotherapy.  Risk and benefit of continuation of platinum based chemo with worsening ototoxicity was discussed with the patient.  Patient understands and would like to proceed.  Nephrotoxicity was completely resolved with IV fluids.  -Switched to [facility] split dose cisplatin on day 1 and day 2 with cycle 2.   -After completion of cycle 4, creatinine again increased to 1.5.   Interval history Patient was seen today as follow-up, labs on maintenance nivolumab. After the first nivolumab infusion, the next day patient broke out into diffuse rash on the bilateral arms, legs, thighs and back.  Has noticed mild improvement in the past 2 weeks.  Continues to have itching.  Has not tried any intervention.  Otherwise he continues to feel well.  Is working full-time job.  Energy level is improving.  Appetite is improving.  Gaining weight.  I have reviewed his chart and materials related to his cancer extensively and collaborated history with the patient. Summary of oncologic history is as follows: Oncology History  Bladder cancer (HCC)  10/25/2022 Initial Diagnosis   Seen by Dr. Lonna Cobb for intermittent hematuria since February 2024 along with other urinary symptoms.   10/28/2022 Imaging   CT hematuria workup Showed 2.2 cm bladder mass worrisome for cancer.  Adjacent mild diffuse bladder wall thickening.  Based on the location this may involve the extreme distal aspect of the right UVJ.  Extensive  atherosclerotic changes with the occlusion of the right common iliac artery and areas of significant stenosis along the left. Please correlate with any symptoms such as claudication and further workup as clinically appropriate.  CT chest with contrast IMPRESSION: 1. Lung-RADS 2, benign appearance or behavior. Continue annual screening with low-dose chest CT without contrast in 12 months. 2. Similar ascending aortic dilatation at 4.2 cm. Recommend attention on follow-up lung cancer screening CT in 1 year.   11/29/2022 Procedure   Intraoperative findings:  Cystoscopy-urethra normal in caliber without stricture.  Prostate with mild lateral lobe enlargement and moderate bladder neck elevation.  UOs normal-appearing bilaterally.  The right UO was anterior to the bladder tumor Bladder tumor: 3 cm papillary/nodular tumor superior to right UO and extending laterally; ~ 1 cm papillary tumor anterior bladder neck 10:00; 2-1 cm papillary tumors anterior bladder neck 2:00    11/29/2022 Pathology Results        12/07/2022 Cancer Staging   Staging form: Urinary Bladder, AJCC 8th Edition - Clinical stage from 12/07/2022: Stage II (cT2, cN0, cM0) - Signed by Ryan Barter, MD on 01/02/2023 Stage prefix: Initial diagnosis WHO/ISUP grade (low/high): High Grade Histologic grading system: 2 grade system   01/02/2023 Initial Diagnosis   Bladder cancer (HCC)   01/23/2023 - 04/06/2023 Chemotherapy   Patient is on Treatment Plan : BLADDER DOSE DENSE MVAC  q14d     07/07/2023 Cancer Staging   Staging form: Urinary Bladder, AJCC 8th Edition - Pathologic stage from 07/07/2023: Stage IIIB (ypT2a, pN2, cM0) - Signed by Ryan Barter, MD on 08/31/2023 Stage prefix: Post-therapy Response to neoadjuvant therapy: Partial response WHO/ISUP grade (low/high): High Grade Histologic grading system: 2 grade system   08/31/2023 -  Chemotherapy   Patient is on Treatment Plan : BLADDER Nivolumab (240) q14d        MEDICAL HISTORY:  Past Medical History:  Diagnosis Date   Aortic atherosclerosis (HCC)    Chronic foot pain, left    History of hematuria    Hypertension    Other emphysema (HCC)    Other male erectile dysfunction    Pre-diabetes    Pure hypercholesterolemia    Tobacco dependence     SURGICAL HISTORY: Past Surgical History:  Procedure Laterality Date   ANKLE ARTHROSCOPY Left 08/11/2020   Procedure: LEFT ANKLE ARTHROSCOPY AND DEBRIDEMENT;  Surgeon: Nadara Mustard, MD;  Location: [facility];  Service: Orthopedics;  Laterality: Left;   BLADDER INSTILLATION N/A 11/29/2022   Procedure: BLADDER INSTILLATION OF GEMCITABINE;  Surgeon: [name], MD;  Location: [facility];  Service: Urology;  Laterality: N/A;   COLONOSCOPY     CYSTOSCOPY WITH INJECTION N/A 07/07/2023   Procedure: CYSTOSCOPY WITH INJECTION;  Surgeon: [name]., MD;  Location: [facility];  Service: Urology;  Laterality: N/A;   IR IMAGING GUIDED PORT INSERTION  01/20/2023   PELVIC LYMPH NODE DISSECTION N/A 07/07/2023   Procedure: PELVIC LYMPH NODE DISSECTION;  Surgeon: [name]., MD;  Location: [facility];  Service: Urology;  Laterality: N/A;   ROBOT ASSISTED LAPAROSCOPIC COMPLETE CYSTECT ILEAL CONDUIT N/A 07/07/2023   Procedure: XI ROBOTIC ASSISTED LAPAROSCOPIC COMPLETE CYSTECT ILEAL CONDUIT, UMBILICAL HERNIA REPAIR, BILATERAL INGUINAL HERNIA REPAIR;  Surgeon: [name]., MD;  Location: [facility];  Service: Urology;  Laterality: N/A;   ROBOT ASSISTED LAPAROSCOPIC RADICAL PROSTATECTOMY N/A 07/07/2023   Procedure: XI ROBOTIC ASSISTED LAPAROSCOPIC RADICAL PROSTATECTOMY;  Surgeon: [name]., MD;  Location: [facility];  Service: Urology;  Laterality: N/A;   TONSILLECTOMY     TRANSURETHRAL RESECTION OF BLADDER TUMOR N/A 11/29/2022   Procedure: TRANSURETHRAL RESECTION OF BLADDER TUMOR (TURBT);  Surgeon: [name], MD;  Location: [facility];  Service: Urology;  Laterality: N/A;     SOCIAL HISTORY: Social History   Socioeconomic History   Marital status: Married    Spouse name: Albin Felling   Number of children: Not on file   Years of education: Not on file   Highest education level: Not on file  Occupational History   Not on file  Tobacco Use   Smoking status: Every Day    Current packs/day: 2.00    Average packs/day: 2.0 packs/day for 43.0 years (86.0 ttl pk-yrs)    Types: Cigarettes   Smokeless tobacco: Never  Substance and Sexual Activity   Alcohol use: Yes    Comment: 1-2 beers/day   Drug use: Never   Sexual activity: Not on file  Other Topics Concern   Not on file  Social History Narrative   Not on file   Social Drivers of Health   Financial Resource Strain: Low Risk  (02/08/2023)   Received from [facility]   Overall Financial Resource Strain (CARDIA)    Difficulty of Paying Living Expenses: Not hard at all  Food Insecurity: No Food Insecurity (07/06/2023)   Hunger Vital  Sign    Worried About Programme researcher, broadcasting/film/video in the Last Year: Never true    Ran Out of Food in the Last Year: Never true  Transportation Needs: No Transportation Needs (07/06/2023)   PRAPARE - Administrator, Civil Service (Medical): No    Lack of Transportation (Non-Medical): No  Physical Activity: Not on file  Stress: No Stress Concern Present (01/25/2023)   [employer] of Occupational Health - Occupational Stress Questionnaire    Feeling of Stress : Not at all  Social Connections: Moderately Integrated (07/06/2023)   Social Connection and Isolation Panel [NHANES]    Frequency of Communication with Friends and Family: More than three times a week    Frequency of Social Gatherings with Friends and Family: Once a week    Attends Religious Services: More than 4 times per year    Active Member of [employer] or Organizations: No    Attends Banker Meetings: Never    Marital Status: Married  Catering manager Violence: Not At Risk  (07/06/2023)   Humiliation, Afraid, Rape, and Kick questionnaire    Fear of Current or Ex-Partner: No    Emotionally Abused: No    Physically Abused: No    Sexually Abused: No    FAMILY HISTORY: History reviewed. No pertinent family history.  ALLERGIES:  is allergic to nsaids and other.  MEDICATIONS:  Current Outpatient Medications  Medication Sig Dispense Refill   triamcinolone cream (KENALOG) 0.1 % Apply 1 Application topically 2 (two) times daily for 10 days. 453 g 0   atorvastatin (LIPITOR) 20 MG tablet TAKE 1 TABLET BY MOUTH EVERY DAY AT NIGHT (Patient not taking: Reported on 09/19/2023)     hydrochlorothiazide (HYDRODIURIL) 25 MG tablet Take 25 mg by mouth daily. (Patient not taking: Reported on 09/19/2023)     oxyCODONE (ROXICODONE) 5 MG immediate release tablet Take 1 tablet (5 mg total) by mouth every 6 (six) hours as needed for moderate pain (pain score 4-6) or severe pain (pain score 7-10) (post-operatively). (Patient not taking: Reported on 09/19/2023) 15 tablet 0   senna-docusate (SENOKOT-S) 8.6-50 MG tablet Take 1 tablet by mouth 2 (two) times daily. While taking strong pain meds to prevent constipation. (Patient not taking: Reported on 09/19/2023) 10 tablet 0   varenicline (CHANTIX) 1 MG tablet Take 1 mg by mouth 2 (two) times daily. (Patient not taking: Reported on 08/31/2023)     Wheat Dextrin (BENEFIBER) POWD Take 0.5 Scoops by mouth daily. (Patient not taking: Reported on 09/19/2023)     No current facility-administered medications for this visit.   Facility-Administered Medications Ordered in Other Visits  Medication Dose Route Frequency Provider Last Rate Last Admin   heparin lock flush 100 unit/mL  500 Units Intravenous Once Ryan Barter, MD        REVIEW OF SYSTEMS:   Pertinent information mentioned in HPI All other systems were reviewed with the patient and are negative.  PHYSICAL EXAMINATION: ECOG PERFORMANCE STATUS: 0 - Asymptomatic  Vitals:   09/19/23 0913   BP: 125/71  Pulse: 89  Resp: 18  Temp: 98.7 F (37.1 C)  SpO2: 100%   Filed Weights   09/19/23 0913  Weight: 151 lb (68.5 kg)    GENERAL:alert, no distress and comfortable SKIN: skin color, texture, turgor are normal, no rashes or significant lesions EYES: normal, conjunctiva are pink and non-injected, sclera clear OROPHARYNX:no exudate, no erythema and lips, buccal mucosa, and tongue normal  NECK: supple, thyroid normal size, non-tender,  without nodularity LYMPH:  no palpable lymphadenopathy in the cervical, axillary or inguinal LUNGS: clear to auscultation and percussion with normal breathing effort HEART: regular rate & rhythm and no murmurs and no lower extremity edema ABDOMEN:abdomen soft, non-tender and normal bowel sounds Musculoskeletal:no cyanosis of digits and no clubbing  PSYCH: alert & oriented x 3 with fluent speech NEURO: no focal motor/sensory deficits  LABORATORY DATA:  I have reviewed the data as listed Lab Results  Component Value Date   WBC 7.9 09/19/2023   HGB 8.9 (L) 09/19/2023   HCT 27.6 (L) 09/19/2023   MCV 96.5 09/19/2023   PLT 355 09/19/2023   Recent Labs    08/07/23 0952 08/31/23 0918 09/19/23 0851  NA 138 138 138  K 4.3 4.0 4.4  CL 111 113* 111  CO2 18* 18* 18*  GLUCOSE 121* 103* 120*  BUN 47* 40* 32*  CREATININE 2.32* 1.75* 1.84*  CALCIUM 8.4* 8.7* 8.8*  GFRNONAA 30* 43* 40*  PROT 6.2* 6.9 7.0  ALBUMIN 2.7* 3.3* 3.7  AST 14* 17 20  ALT 14 18 16   ALKPHOS 63 76 90  BILITOT 0.3 0.4 0.5    RADIOGRAPHIC STUDIES: I have personally reviewed the radiological images as listed and agreed with the findings in the report. No results found.

## 2023-09-19 NOTE — Progress Notes (Signed)
 Patient stopped taking all his medications sometime last month until further notice by his PCP and surgeon.   Starting on 09/02/2022 he broke out in what looks like hives, they are very itchy, so he wants to see if there is something he can take to help with the severe itching.

## 2023-09-20 ENCOUNTER — Other Ambulatory Visit: Payer: Self-pay

## 2023-09-21 ENCOUNTER — Other Ambulatory Visit: Payer: Self-pay

## 2023-09-26 ENCOUNTER — Inpatient Hospital Stay (HOSPITAL_BASED_OUTPATIENT_CLINIC_OR_DEPARTMENT_OTHER): Admitting: Internal Medicine

## 2023-09-26 ENCOUNTER — Inpatient Hospital Stay

## 2023-09-26 ENCOUNTER — Encounter: Payer: Self-pay | Admitting: Internal Medicine

## 2023-09-26 VITALS — BP 128/70 | HR 91 | Temp 98.1°F | Resp 18 | Wt 154.0 lb

## 2023-09-26 DIAGNOSIS — Z5112 Encounter for antineoplastic immunotherapy: Secondary | ICD-10-CM

## 2023-09-26 DIAGNOSIS — L509 Urticaria, unspecified: Secondary | ICD-10-CM

## 2023-09-26 DIAGNOSIS — C679 Malignant neoplasm of bladder, unspecified: Secondary | ICD-10-CM | POA: Diagnosis not present

## 2023-09-26 LAB — CMP (CANCER CENTER ONLY)
ALT: 14 U/L (ref 0–44)
AST: 17 U/L (ref 15–41)
Albumin: 3.7 g/dL (ref 3.5–5.0)
Alkaline Phosphatase: 100 U/L (ref 38–126)
Anion gap: 8 (ref 5–15)
BUN: 33 mg/dL — ABNORMAL HIGH (ref 8–23)
CO2: 20 mmol/L — ABNORMAL LOW (ref 22–32)
Calcium: 8.6 mg/dL — ABNORMAL LOW (ref 8.9–10.3)
Chloride: 111 mmol/L (ref 98–111)
Creatinine: 1.71 mg/dL — ABNORMAL HIGH (ref 0.61–1.24)
GFR, Estimated: 44 mL/min — ABNORMAL LOW (ref >60)
Glucose, Bld: 124 mg/dL — ABNORMAL HIGH (ref 70–99)
Potassium: 4 mmol/L (ref 3.5–5.1)
Sodium: 139 mmol/L (ref 135–145)
Total Bilirubin: 0.6 mg/dL (ref 0.0–1.2)
Total Protein: 7.1 g/dL (ref 6.5–8.1)

## 2023-09-26 LAB — CBC WITH DIFFERENTIAL (CANCER CENTER ONLY)
Abs Immature Granulocytes: 0.08 10*3/uL — ABNORMAL HIGH (ref 0.00–0.07)
Basophils Absolute: 0 10*3/uL (ref 0.0–0.1)
Basophils Relative: 0 %
Eosinophils Absolute: 0.4 10*3/uL (ref 0.0–0.5)
Eosinophils Relative: 4 %
HCT: 27.6 % — ABNORMAL LOW (ref 39.0–52.0)
Hemoglobin: 9 g/dL — ABNORMAL LOW (ref 13.0–17.0)
Immature Granulocytes: 1 %
Lymphocytes Relative: 16 %
Lymphs Abs: 1.7 10*3/uL (ref 0.7–4.0)
MCH: 31.9 pg (ref 26.0–34.0)
MCHC: 32.6 g/dL (ref 30.0–36.0)
MCV: 97.9 fL (ref 80.0–100.0)
Monocytes Absolute: 1 10*3/uL (ref 0.1–1.0)
Monocytes Relative: 10 %
Neutro Abs: 7.3 10*3/uL (ref 1.7–7.7)
Neutrophils Relative %: 69 %
Platelet Count: 357 10*3/uL (ref 150–400)
RBC: 2.82 MIL/uL — ABNORMAL LOW (ref 4.22–5.81)
RDW: 15.3 % (ref 11.5–15.5)
WBC Count: 10.6 10*3/uL — ABNORMAL HIGH (ref 4.0–10.5)
nRBC: 0 % (ref 0.0–0.2)

## 2023-09-26 MED ORDER — SODIUM CHLORIDE 0.9 % IV SOLN
INTRAVENOUS | Status: DC
  Filled 2023-09-26: qty 250

## 2023-09-26 MED ORDER — SODIUM CHLORIDE 0.9% FLUSH
10.0000 mL | INTRAVENOUS | Status: DC | PRN
  Administered 2023-09-26: 10 mL via INTRAVENOUS
  Filled 2023-09-26: qty 10

## 2023-09-26 MED ORDER — TRIAMCINOLONE ACETONIDE 0.5 % EX OINT: 1.0000 | TOPICAL_OINTMENT | Freq: Two times a day (BID) | CUTANEOUS | 0 refills | Status: DC

## 2023-09-26 MED ORDER — HEPARIN SOD (PORK) LOCK FLUSH 100 UNIT/ML IV SOLN
500.0000 [IU] | Freq: Once | INTRAVENOUS | Status: AC | PRN
  Administered 2023-09-26: 500 [IU]
  Filled 2023-09-26: qty 5

## 2023-09-26 MED ORDER — SODIUM CHLORIDE 0.9% FLUSH
10.0000 mL | INTRAVENOUS | Status: DC | PRN
  Filled 2023-09-26: qty 10

## 2023-09-26 MED ORDER — SODIUM CHLORIDE 0.9 % IV SOLN
240.0000 mg | Freq: Once | INTRAVENOUS | Status: AC
  Administered 2023-09-26: 240 mg via INTRAVENOUS
  Filled 2023-09-26: qty 24

## 2023-09-26 NOTE — Patient Instructions (Signed)
 [facility] - A DEPT OF [facility]  Discharge Instructions: Thank you for choosing [facility] to provide your oncology and hematology care.  If you have a lab appointment with the Cancer Center, please go directly to the Cancer Center and check in at the registration area.  Wear comfortable clothing and clothing appropriate for easy access to any Portacath or PICC line.   We strive to give you quality time with your provider. You may need to reschedule your appointment if you arrive late (15 or more minutes).  Arriving late affects you and other patients whose appointments are after yours.  Also, if you miss three or more appointments without notifying the office, you may be dismissed from the clinic at the provider's discretion.      For prescription refill requests, have your pharmacy contact our office and allow 72 hours for refills to be completed.    Today you received the following chemotherapy and/or immunotherapy agents : nivolumab    To help prevent nausea and vomiting after your treatment, we encourage you to take your nausea medication as directed.  BELOW ARE SYMPTOMS THAT SHOULD BE REPORTED IMMEDIATELY: *FEVER GREATER THAN 100.4 F (38 C) OR HIGHER *CHILLS OR SWEATING *NAUSEA AND VOMITING THAT IS NOT CONTROLLED WITH YOUR NAUSEA MEDICATION *UNUSUAL SHORTNESS OF BREATH *UNUSUAL BRUISING OR BLEEDING *URINARY PROBLEMS (pain or burning when urinating, or frequent urination) *BOWEL PROBLEMS (unusual diarrhea, constipation, pain near the anus) TENDERNESS IN MOUTH AND THROAT WITH OR WITHOUT PRESENCE OF ULCERS (sore throat, sores in mouth, or a toothache) UNUSUAL RASH, SWELLING OR PAIN  UNUSUAL VAGINAL DISCHARGE OR ITCHING   Items with * indicate a potential emergency and should be followed up as soon as possible or go to the Emergency Department if any problems should occur.  Please show the CHEMOTHERAPY ALERT CARD or IMMUNOTHERAPY  ALERT CARD at check-in to the Emergency Department and triage nurse.  Should you have questions after your visit or need to cancel or reschedule your appointment, please contact [facility] - A DEPT OF [facility]  [phone] and follow the prompts.  Office hours are 8:00 a.m. to 4:30 p.m. Monday - Friday. Please note that voicemails left after 4:00 p.m. may not be returned until the following business day.  We are closed weekends and major holidays. You have access to a nurse at all times for urgent questions. Please call the main number to the clinic [phone] and follow the prompts.  For any non-urgent questions, you may also contact your provider using MyChart. We now offer e-Visits for anyone 83 and older to request care online for non-urgent symptoms. For details visit [email or web address].   Also download the MyChart app! Go to the app store, search "MyChart", open the app, select [facility], and log in with your MyChart username and password.

## 2023-09-26 NOTE — Progress Notes (Signed)
 [facility] CONSULT NOTE  Patient Care Team: Marisue Ivan, MD as PCP - General (Family Medicine) Michaelyn Barter, MD as Consulting Physician (Oncology)   CANCER STAGING   Cancer Staging  Bladder cancer [facility]) Staging form: Urinary Bladder, AJCC 8th Edition - Clinical stage from 12/07/2022: Stage II (cT2, cN0, cM0) - Signed by Michaelyn Barter, MD on 01/02/2023 Stage prefix: Initial diagnosis WHO/ISUP grade (low/high): High Grade Histologic grading system: 2 grade system - Pathologic stage from 07/07/2023: Stage IIIB (ypT2a, pN2, cM0) - Signed by Michaelyn Barter, MD on 08/31/2023 Stage prefix: Post-therapy Response to neoadjuvant therapy: Partial response WHO/ISUP grade (low/high): High Grade Histologic grading system: 2 grade system   ASSESSMENT & PLAN:  Ryan Dixon 68 y.o. male with pmh of HTN, HLD, current smoker referred to Medical Oncology for management of bladder cancer.   # Bladder cancer, high grade, pathologic stage IIIb -Was seen by Dr. Lonna Cobb for intermittent hematuria.  Underwent cystoscopy with biopsy of mass close to right ureteral orifice, 2 masses from the bladder neck.  All 3 biopsy showed invasive high-grade urothelial carcinoma involving muscularis propria.  -Completed 4 cycles of neoadjuvant dose dense MVAC on 04/06/2023.  Post completion of 4 cycles, developed platinum induced nephrotoxicity.  -Status post cystoprostatectomy with conduit diversion with ureter-enteric anastomosis of bilateral single ureters on 07/07/2023 by Dr. Berneice Heinrich.  Inguinal hernia was also repaired the time of cystectomy.  -Pathology showed invasive high-grade urothelial cancer invades superficial muscularis propria margins negative.  2/14 lymph nodes positive for metastatic disease. ypT2a, ypN2  -Based on Checkmate 274, on adjuvant nivolumab for 1 year.  Nivolumab maintenance started on 09/05/2023.    -Continues to have rash on his bilateral arms and lower extremity.  On  back, no active lesions.  Treatment was held last week.  He has been using triamcinolone cream twice a day with mild improvement.  Sent another prescription of Kenalog higher strength 0.5%.  Discussed about risk and the benefit of the treatment and we will proceed with C2 nivolumab.  Labs reviewed and acceptable for treatment.  Advised to use loratadine with itching.  Will do cetirizine as premedication with nivolumab.  Patient advised to reach out to [place] if he has worsening of skin rash with the treatment.   # CKD -Developed platinum induced nephrotoxicity post completion of cycle 4 dose dense MVAC. -Baseline creatinine between 1.5-2.  Had ureteral stent removal by Dr.Manny  # Normocytic anemia -Likely secondary to anemia of chronic disease from CKD. -Iron panel reviewed.  # Hypertension- resume hydrochlorothiazide 12.5 mg daily.  Monitor BP at home.  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  # Tobacco dependence -Has cut down to 5 cigarettes a day.  Has stopped Chantix  # Access-port  Orders Placed This Encounter  Procedures   CBC with Differential (Cancer Center Only)    Standing Status:   Future    Expected Date:   10/10/2023    Expiration Date:   10/09/2024   CMP (Cancer Center only)    Standing Status:   Future    Expected Date:   10/10/2023    Expiration Date:   10/09/2024   T4    Standing Status:   Future    Expected Date:   10/10/2023    Expiration Date:   10/09/2024   TSH  Standing Status:   Future    Expected Date:   10/10/2023    Expiration Date:   10/09/2024   RTC in 2 weeks weeks for MD visit, labs, nivolumab  The total time spent in the appointment was 30 minutes encounter with patients including review of chart and various tests results, discussions about plan of care and coordination  of care plan   All questions were answered. The patient knows to call the clinic with any problems, questions or concerns. No barriers to learning was detected.  Loreatha Rodney, MD 4/15/20252:56 PM   HISTORY OF PRESENTING ILLNESS:  Ryan Dixon 68 y.o. male with pmh of HTN, HLD, current smoker referred to Medical Oncology for management of bladder cancer.   01/2023 - Within 1 week of initiation of cycle 1 of ddMVAC, patient developed bilateral constant tinnitus and decreased hearing with high-pitched sound. Nephrotoxicity with creatinine peaking at 1.6.  Supported with IV fluids. Evaluated by Dr. Silvestre Drum, ENT.  Diagnosed with sensorineural hearing loss and was advised hearing aid.  Chemotherapy was briefly placed on hold. He likely had some baseline hearing issues which was exacerbated by platinum chemotherapy.  ENT was okay to continue with chemotherapy.  Risk and benefit of continuation of platinum based chemo with worsening ototoxicity was discussed with the patient.  Patient understands and would like to proceed.  Nephrotoxicity was completely resolved with IV fluids.  -Switched to ddMVAC split dose cisplatin on day 1 and day 2 with cycle 2.   -After completion of cycle 4, creatinine again increased to 1.5.   Interval history Patient was seen today as follow-up, labs on maintenance nivolumab. Patient continues to have rash on bilateral arms and lower extremity.  Associated with itching.  He has been using triamcinolone cream which has caused mild improvement.  Denies any other concerns.  His energy level is improving.  He is working full-time.  Rash on the back is not active.  I have reviewed his chart and materials related to his cancer extensively and collaborated history with the patient. Summary of oncologic history is as follows: Oncology History  Bladder cancer (HCC)  10/25/2022 Initial Diagnosis   Seen by Dr. Cherylene Corrente for intermittent hematuria since February 2024 along with other  urinary symptoms.   10/28/2022 Imaging   CT hematuria workup Showed 2.2 cm bladder mass worrisome for cancer.  Adjacent mild diffuse bladder wall thickening.  Based on the location this may involve the extreme distal aspect of the right UVJ.  Extensive atherosclerotic changes with the occlusion of the right common iliac artery and areas of significant stenosis along the left. Please correlate with any symptoms such as claudication and further workup as clinically appropriate.  CT chest with contrast IMPRESSION: 1. Lung-RADS 2, benign appearance or behavior. Continue annual screening with low-dose chest CT without contrast in 12 months. 2. Similar ascending aortic dilatation at 4.2 cm. Recommend attention on follow-up lung cancer screening CT in 1 year.   11/29/2022 Procedure   Intraoperative findings:  Cystoscopy-urethra normal in caliber without stricture.  Prostate with mild lateral lobe enlargement and moderate bladder neck elevation.  UOs normal-appearing bilaterally.  The right UO was anterior to the bladder tumor Bladder tumor: 3 cm papillary/nodular tumor superior to right UO and extending laterally; ~ 1 cm papillary tumor anterior bladder neck 10:00; 2-1 cm papillary tumors anterior bladder neck 2:00    11/29/2022 Pathology Results        12/07/2022 Cancer Staging   Staging form: Urinary Bladder, AJCC  8th Edition - Clinical stage from 12/07/2022: Stage II (cT2, cN0, cM0) - Signed by Adore Kithcart, MD on 01/02/2023 Stage prefix: Initial diagnosis WHO/ISUP grade (low/high): High Grade Histologic grading system: 2 grade system   01/02/2023 Initial Diagnosis   Bladder cancer (HCC)   01/23/2023 - 04/06/2023 Chemotherapy   Patient is on Treatment Plan : BLADDER DOSE DENSE MVAC q14d     07/07/2023 Cancer Staging   Staging form: Urinary Bladder, AJCC 8th Edition - Pathologic stage from 07/07/2023: Stage IIIB (ypT2a, pN2, cM0) - Signed by Loreatha Rodney, MD on 08/31/2023 Stage  prefix: Post-therapy Response to neoadjuvant therapy: Partial response WHO/ISUP grade (low/high): High Grade Histologic grading system: 2 grade system   08/31/2023 -  Chemotherapy   Patient is on Treatment Plan : BLADDER Nivolumab (240) q14d       MEDICAL HISTORY:  Past Medical History:  Diagnosis Date   Aortic atherosclerosis (HCC)    Chronic foot pain, left    History of hematuria    Hypertension    Other emphysema (HCC)    Other male erectile dysfunction    Pre-diabetes    Pure hypercholesterolemia    Tobacco dependence     SURGICAL HISTORY: Past Surgical History:  Procedure Laterality Date   ANKLE ARTHROSCOPY Left 08/11/2020   Procedure: LEFT ANKLE ARTHROSCOPY AND DEBRIDEMENT;  Surgeon: Timothy Ford, MD;  Location: [facility];  Service: Orthopedics;  Laterality: Left;   BLADDER INSTILLATION N/A 11/29/2022   Procedure: BLADDER INSTILLATION OF GEMCITABINE;  Surgeon: Geraline Knapp, MD;  Location: [facility];  Service: Urology;  Laterality: N/A;   COLONOSCOPY     CYSTOSCOPY WITH INJECTION N/A 07/07/2023   Procedure: CYSTOSCOPY WITH INJECTION;  Surgeon: Melody Spurling., MD;  Location: [facility];  Service: Urology;  Laterality: N/A;   IR IMAGING GUIDED PORT INSERTION  01/20/2023   PELVIC LYMPH NODE DISSECTION N/A 07/07/2023   Procedure: PELVIC LYMPH NODE DISSECTION;  Surgeon: Melody Spurling., MD;  Location: [facility];  Service: Urology;  Laterality: N/A;   ROBOT ASSISTED LAPAROSCOPIC COMPLETE CYSTECT ILEAL CONDUIT N/A 07/07/2023   Procedure: XI ROBOTIC ASSISTED LAPAROSCOPIC COMPLETE CYSTECT ILEAL CONDUIT, UMBILICAL HERNIA REPAIR, BILATERAL INGUINAL HERNIA REPAIR;  Surgeon: Melody Spurling., MD;  Location: [facility];  Service: Urology;  Laterality: N/A;   ROBOT ASSISTED LAPAROSCOPIC RADICAL PROSTATECTOMY N/A 07/07/2023   Procedure: XI ROBOTIC ASSISTED LAPAROSCOPIC RADICAL PROSTATECTOMY;  Surgeon: Melody Spurling., MD;  Location: [facility];  Service: Urology;   Laterality: N/A;   TONSILLECTOMY     TRANSURETHRAL RESECTION OF BLADDER TUMOR N/A 11/29/2022   Procedure: TRANSURETHRAL RESECTION OF BLADDER TUMOR (TURBT);  Surgeon: Geraline Knapp, MD;  Location: [facility];  Service: Urology;  Laterality: N/A;    SOCIAL HISTORY: Social History   Socioeconomic History   Marital status: Married    Spouse name: Ethelle Herb   Number of children: Not on file   Years of education: Not on file   Highest education level: Not on file  Occupational History   Not on file  Tobacco Use   Smoking status: Every Day    Current packs/day: 2.00    Average packs/day: 2.0 packs/day for 43.0 years (86.0 ttl pk-yrs)    Types: Cigarettes   Smokeless tobacco: Never  Substance and Sexual Activity   Alcohol use: Yes    Comment: 1-2 beers/day   Drug use: Never   Sexual activity: Not on file  Other Topics Concern   Not on  file  Social History Narrative   Not on file   Social Drivers of Health   Financial Resource Strain: Low Risk  (02/08/2023)   Received from [facility]   Overall Financial Resource Strain (CARDIA)    Difficulty of Paying Living Expenses: Not hard at all  Food Insecurity: No Food Insecurity (07/06/2023)   Hunger Vital Sign    Worried About Running Out of Food in the Last Year: Never true    Ran Out of Food in the Last Year: Never true  Transportation Needs: No Transportation Needs (07/06/2023)   PRAPARE - Administrator, Civil Service (Medical): No    Lack of Transportation (Non-Medical): No  Physical Activity: Not on file  Stress: No Stress Concern Present (01/25/2023)   [employer] of Occupational Health - Occupational Stress Questionnaire    Feeling of Stress : Not at all  Social Connections: Moderately Integrated (07/06/2023)   Social Connection and Isolation Panel [NHANES]    Frequency of Communication with Friends and Family: More than three times a week    Frequency of Social Gatherings with Friends and  Family: Once a week    Attends Religious Services: More than 4 times per year    Active Member of [employer] or Organizations: No    Attends Banker Meetings: Never    Marital Status: Married  Catering manager Violence: Not At Risk (07/06/2023)   Humiliation, Afraid, Rape, and Kick questionnaire    Fear of Current or Ex-Partner: No    Emotionally Abused: No    Physically Abused: No    Sexually Abused: No    FAMILY HISTORY: History reviewed. No pertinent family history.  ALLERGIES:  is allergic to nsaids and other.  MEDICATIONS:  Current Outpatient Medications  Medication Sig Dispense Refill   triamcinolone cream (KENALOG) 0.1 % Apply 1 Application topically 2 (two) times daily for 10 days. 453 g 0   triamcinolone ointment (KENALOG) 0.5 % Apply 1 Application topically 2 (two) times daily. 30 g 0   atorvastatin (LIPITOR) 20 MG tablet TAKE 1 TABLET BY MOUTH EVERY DAY AT NIGHT (Patient not taking: Reported on 09/19/2023)     hydrochlorothiazide (HYDRODIURIL) 25 MG tablet Take 25 mg by mouth daily. (Patient not taking: Reported on 09/19/2023)     oxyCODONE (ROXICODONE) 5 MG immediate release tablet Take 1 tablet (5 mg total) by mouth every 6 (six) hours as needed for moderate pain (pain score 4-6) or severe pain (pain score 7-10) (post-operatively). (Patient not taking: Reported on 09/26/2023) 15 tablet 0   senna-docusate (SENOKOT-S) 8.6-50 MG tablet Take 1 tablet by mouth 2 (two) times daily. While taking strong pain meds to prevent constipation. (Patient not taking: Reported on 09/26/2023) 10 tablet 0   varenicline (CHANTIX) 1 MG tablet Take 1 mg by mouth 2 (two) times daily. (Patient not taking: Reported on 08/31/2023)     Wheat Dextrin (BENEFIBER) POWD Take 0.5 Scoops by mouth daily. (Patient not taking: Reported on 09/19/2023)     No current facility-administered medications for this visit.   Facility-Administered Medications Ordered in Other Visits  Medication Dose Route Frequency  Provider Last Rate Last Admin   0.9 %  sodium chloride infusion   Intravenous Continuous Michaelyn Barter, MD       heparin lock flush 100 unit/mL  500 Units Intravenous Once Michaelyn Barter, MD       heparin lock flush 100 unit/mL  500 Units Intracatheter Once PRN Michaelyn Barter, MD  nivolumab (OPDIVO) 240 mg in sodium chloride 0.9 % 100 mL chemo infusion  240 mg Intravenous Once Mallissa Lorenzen, MD       sodium chloride flush (NS) 0.9 % injection 10 mL  10 mL Intravenous PRN Rosmary Dionisio, MD   10 mL at 09/26/23 1359   sodium chloride flush (NS) 0.9 % injection 10 mL  10 mL Intracatheter PRN Braelon Sprung, MD        REVIEW OF SYSTEMS:   Pertinent information mentioned in HPI All other systems were reviewed with the patient and are negative.  PHYSICAL EXAMINATION: ECOG PERFORMANCE STATUS: 0 - Asymptomatic  Vitals:   09/26/23 1418  BP: 128/70  Pulse: 91  Resp: 18  Temp: 98.1 F (36.7 C)  SpO2: 100%   Filed Weights   09/26/23 1418  Weight: 154 lb (69.9 kg)    GENERAL:alert, no distress and comfortable SKIN: skin color, texture, turgor are normal, no rashes or significant lesions EYES: normal, conjunctiva are pink and non-injected, sclera clear OROPHARYNX:no exudate, no erythema and lips, buccal mucosa, and tongue normal  NECK: supple, thyroid normal size, non-tender, without nodularity LYMPH:  no palpable lymphadenopathy in the cervical, axillary or inguinal LUNGS: clear to auscultation and percussion with normal breathing effort HEART: regular rate & rhythm and no murmurs and no lower extremity edema ABDOMEN:abdomen soft, non-tender and normal bowel sounds Musculoskeletal:no cyanosis of digits and no clubbing  PSYCH: alert & oriented x 3 with fluent speech NEURO: no focal motor/sensory deficits  LABORATORY DATA:  I have reviewed the data as listed Lab Results  Component Value Date   WBC 10.6 (H) 09/26/2023   HGB 9.0 (L) 09/26/2023   HCT 27.6 (L) 09/26/2023    MCV 97.9 09/26/2023   PLT 357 09/26/2023   Recent Labs    08/31/23 0918 09/19/23 0851 09/26/23 1359  NA 138 138 139  K 4.0 4.4 4.0  CL 113* 111 111  CO2 18* 18* 20*  GLUCOSE 103* 120* 124*  BUN 40* 32* 33*  CREATININE 1.75* 1.84* 1.71*  CALCIUM 8.7* 8.8* 8.6*  GFRNONAA 43* 40* 44*  PROT 6.9 7.0 7.1  ALBUMIN 3.3* 3.7 3.7  AST 17 20 17   ALT 18 16 14   ALKPHOS 76 90 100  BILITOT 0.4 0.5 0.6    RADIOGRAPHIC STUDIES: I have personally reviewed the radiological images as listed and agreed with the findings in the report. No results found.

## 2023-09-26 NOTE — Progress Notes (Signed)
 Patient still has the rash, he is using the ointment that Dr. Aris Bel prescribed him, he kind of feels like it might have made the rash a little worse. He is still not taking any of his BP medications due to his PCP saying that his BP has been good so no need in starting back on them.

## 2023-09-27 ENCOUNTER — Other Ambulatory Visit: Payer: Self-pay

## 2023-09-28 ENCOUNTER — Other Ambulatory Visit: Payer: Self-pay

## 2023-10-02 ENCOUNTER — Other Ambulatory Visit: Payer: Self-pay

## 2023-10-03 ENCOUNTER — Encounter: Payer: Self-pay | Admitting: Internal Medicine

## 2023-10-10 ENCOUNTER — Inpatient Hospital Stay

## 2023-10-10 ENCOUNTER — Encounter: Payer: Self-pay | Admitting: Internal Medicine

## 2023-10-10 ENCOUNTER — Ambulatory Visit: Admitting: Dermatology

## 2023-10-10 ENCOUNTER — Inpatient Hospital Stay (HOSPITAL_BASED_OUTPATIENT_CLINIC_OR_DEPARTMENT_OTHER): Admitting: Internal Medicine

## 2023-10-10 VITALS — BP 130/67 | HR 79 | Temp 98.1°F | Resp 16 | Wt 155.0 lb

## 2023-10-10 DIAGNOSIS — Z7189 Other specified counseling: Secondary | ICD-10-CM

## 2023-10-10 DIAGNOSIS — C679 Malignant neoplasm of bladder, unspecified: Secondary | ICD-10-CM | POA: Diagnosis not present

## 2023-10-10 DIAGNOSIS — L409 Psoriasis, unspecified: Secondary | ICD-10-CM

## 2023-10-10 DIAGNOSIS — R21 Rash and other nonspecific skin eruption: Secondary | ICD-10-CM | POA: Insufficient documentation

## 2023-10-10 DIAGNOSIS — Z79899 Other long term (current) drug therapy: Secondary | ICD-10-CM

## 2023-10-10 DIAGNOSIS — Z5112 Encounter for antineoplastic immunotherapy: Secondary | ICD-10-CM | POA: Diagnosis not present

## 2023-10-10 DIAGNOSIS — Z95828 Presence of other vascular implants and grafts: Secondary | ICD-10-CM

## 2023-10-10 LAB — CMP (CANCER CENTER ONLY)
ALT: 16 U/L (ref 0–44)
AST: 18 U/L (ref 15–41)
Albumin: 3.6 g/dL (ref 3.5–5.0)
Alkaline Phosphatase: 91 U/L (ref 38–126)
Anion gap: 7 (ref 5–15)
BUN: 28 mg/dL — ABNORMAL HIGH (ref 8–23)
CO2: 25 mmol/L (ref 22–32)
Calcium: 8.4 mg/dL — ABNORMAL LOW (ref 8.9–10.3)
Chloride: 106 mmol/L (ref 98–111)
Creatinine: 1.78 mg/dL — ABNORMAL HIGH (ref 0.61–1.24)
GFR, Estimated: 42 mL/min — ABNORMAL LOW (ref >60)
Glucose, Bld: 126 mg/dL — ABNORMAL HIGH (ref 70–99)
Potassium: 4.1 mmol/L (ref 3.5–5.1)
Sodium: 138 mmol/L (ref 135–145)
Total Bilirubin: 0.2 mg/dL (ref 0.0–1.2)
Total Protein: 6.7 g/dL (ref 6.5–8.1)

## 2023-10-10 LAB — CBC WITH DIFFERENTIAL (CANCER CENTER ONLY)
Abs Immature Granulocytes: 0.06 10*3/uL (ref 0.00–0.07)
Basophils Absolute: 0 10*3/uL (ref 0.0–0.1)
Basophils Relative: 0 %
Eosinophils Absolute: 0.3 10*3/uL (ref 0.0–0.5)
Eosinophils Relative: 3 %
HCT: 27.8 % — ABNORMAL LOW (ref 39.0–52.0)
Hemoglobin: 9 g/dL — ABNORMAL LOW (ref 13.0–17.0)
Immature Granulocytes: 1 %
Lymphocytes Relative: 22 %
Lymphs Abs: 2 10*3/uL (ref 0.7–4.0)
MCH: 31.5 pg (ref 26.0–34.0)
MCHC: 32.4 g/dL (ref 30.0–36.0)
MCV: 97.2 fL (ref 80.0–100.0)
Monocytes Absolute: 0.9 10*3/uL (ref 0.1–1.0)
Monocytes Relative: 10 %
Neutro Abs: 5.9 10*3/uL (ref 1.7–7.7)
Neutrophils Relative %: 64 %
Platelet Count: 335 10*3/uL (ref 150–400)
RBC: 2.86 MIL/uL — ABNORMAL LOW (ref 4.22–5.81)
RDW: 14.7 % (ref 11.5–15.5)
WBC Count: 9.1 10*3/uL (ref 4.0–10.5)
nRBC: 0 % (ref 0.0–0.2)

## 2023-10-10 LAB — TSH: TSH: 1.629 u[IU]/mL (ref 0.350–4.500)

## 2023-10-10 MED ORDER — MUPIROCIN 2 % EX OINT: TOPICAL_OINTMENT | CUTANEOUS | 1 refills | Status: DC

## 2023-10-10 MED ORDER — HEPARIN SOD (PORK) LOCK FLUSH 100 UNIT/ML IV SOLN
500.0000 [IU] | Freq: Once | INTRAVENOUS | Status: AC
  Administered 2023-10-10: 500 [IU] via INTRAVENOUS
  Filled 2023-10-10: qty 5

## 2023-10-10 MED ORDER — PREDNISONE 10 MG PO TABS: 40.0000 mg | ORAL_TABLET | Freq: Every day | ORAL | 0 refills | Status: AC

## 2023-10-10 MED ORDER — SERNIVO 0.05 % EX EMUL: CUTANEOUS | 4 refills | Status: DC

## 2023-10-10 NOTE — Patient Instructions (Signed)

## 2023-10-10 NOTE — Patient Instructions (Addendum)
 For rash  Can use over the counter claritin in morning and bendadry at night.   Start Sernivo 0.05 % emul - apply topically to affected areas of rash twice daily as needed. Avoid face, groin and axilla   Can use mupirocin  2 % ointment to affected area at right thigh twice daily and cover until healed  Your prescription was sent to [facility] in [place]. A representative from [facility] will contact you within 3 business hours to verify your address and insurance information to schedule a free delivery. If for any reason you do not receive a phone call from them, please reach out to them. Their phone number is [phone] and their hours are Monday-Friday 9:00 am-5:00 pm.     Biopsy Wound Care Instructions  Leave the original bandage on for 24 hours if possible.  If the bandage becomes soaked or soiled before that time, it is OK to remove it and examine the wound.  A small amount of post-operative bleeding is normal.  If excessive bleeding occurs, remove the bandage, place gauze over the site and apply continuous pressure (no peeking) over the area for 30 minutes. If this does not work, please call our clinic as soon as possible or page your doctor if it is after hours.   Once a day, cleanse the wound with soap and water . It is fine to shower. If a thick crust develops you may use a Q-tip dipped into dilute hydrogen peroxide (mix 1:1 with water ) to dissolve it.  Hydrogen peroxide can slow the healing process, so use it only as needed.    After washing, apply petroleum jelly (Vaseline) or an antibiotic ointment if your doctor prescribed one for you, followed by a bandage.    For best healing, the wound should be covered with a layer of ointment at all times. If you are not able to keep the area covered with a bandage to hold the ointment in place, this may mean re-applying the ointment several times a day.  Continue this wound care until the wound has healed and is no longer open.    Itching and mild discomfort is normal during the healing process. However, if you develop pain or severe itching, please call our office.   If you have any discomfort, you can take Tylenol  (acetaminophen ) or ibuprofen as directed on the bottle. (Please do not take these if you have an allergy to them or cannot take them for another reason).  Some redness, tenderness and white or yellow material in the wound is normal healing.  If the area becomes very sore and red, or develops a thick yellow-green material (pus), it may be infected; please notify us .    If you have stitches, return to clinic as directed to have the stitches removed. You will continue wound care for 2-3 days after the stitches are removed.   Wound healing continues for up to one year following surgery. It is not unusual to experience pain in the scar from time to time during the interval.  If the pain becomes severe or the scar thickens, you should notify the office.    A slight amount of redness in a scar is expected for the first six months.  After six months, the redness will fade and the scar will soften and fade.  The color difference becomes less noticeable with time.  If there are any problems, return for a post-op surgery check at your earliest convenience.  To improve the appearance of the  scar, you can use silicone scar gel, cream, or sheets (such as Mederma or Serica) every night for up to one year. These are available over the counter (without a prescription).  Please call our office at [phone] for any questions or concerns.     Due to recent changes in healthcare laws, you may see results of your pathology and/or laboratory studies on MyChart before the doctors have had a chance to review them. We understand that in some cases there may be results that are confusing or concerning to you. Please understand that not all results are received at the same time and often the doctors may need to interpret multiple  results in order to provide you with the best plan of care or course of treatment. Therefore, we ask that you please give us  2 business days to thoroughly review all your results before contacting the office for clarification. Should we see a critical lab result, you will be contacted sooner.   If You Need Anything After Your Visit  If you have any questions or concerns for your doctor, please call our main line at [phone] and press option 4 to reach your doctor's medical assistant. If no one answers, please leave a voicemail as directed and we will return your call as soon as possible. Messages left after 4 pm will be answered the following business day.   You may also send us  a message via MyChart. We typically respond to MyChart messages within 1-2 business days.  For prescription refills, please ask your pharmacy to contact our office. Our fax number is [phone].  If you have an urgent issue when the clinic is closed that cannot wait until the next business day, you can page your doctor at the number below.    Please note that while we do our best to be available for urgent issues outside of office hours, we are not available 24/7.   If you have an urgent issue and are unable to reach us , you may choose to seek medical care at your doctor's office, retail clinic, urgent care center, or emergency room.  If you have a medical emergency, please immediately call 911 or go to the emergency department.  Pager Numbers  - Dr. Bary Likes: [phone]  - Dr. Annette Barters: [phone]  - Dr. Felipe Horton: [phone]   In the event of inclement weather, please call our main line at [phone] for an update on the status of any delays or closures.  Dermatology Medication Tips: Please keep the boxes that topical medications come in in order to help keep track of the instructions about where and how to use these. Pharmacies typically print the medication instructions only on the boxes and not  directly on the medication tubes.   If your medication is too expensive, please contact our office at [phone] option 4 or send us  a message through MyChart.   We are unable to tell what your co-pay for medications will be in advance as this is different depending on your insurance coverage. However, we may be able to find a substitute medication at lower cost or fill out paperwork to get insurance to cover a needed medication.   If a prior authorization is required to get your medication covered by your insurance company, please allow us  1-2 business days to complete this process.  Drug prices often vary depending on where the prescription is filled and some pharmacies may offer cheaper prices.  The website www.goodrx.com contains coupons for medications through different pharmacies. The  prices here do not account for what the cost may be with help from insurance (it may be cheaper with your insurance), but the website can give you the price if you did not use any insurance.  - You can print the associated coupon and take it with your prescription to the pharmacy.  - You may also stop by our office during regular business hours and pick up a GoodRx coupon card.  - If you need your prescription sent electronically to a different pharmacy, notify our office through [facility] or by phone at [phone] option 4.     Si Usted Necesita Algo Despus de Su Visita  Tambin puede enviarnos un mensaje a travs de Clinical cytogeneticist. Por lo general respondemos a los mensajes de MyChart en el transcurso de 1 a 2 das hbiles.  Para renovar recetas, por favor pida a su farmacia que se ponga en contacto con nuestra oficina. Franz Jacks de fax es [place] [phone].  Si tiene un asunto urgente cuando la clnica est cerrada y que no puede esperar hasta el siguiente da hbil, puede llamar/localizar a su doctor(a) al nmero que aparece a continuacin.   Por favor, tenga en cuenta que aunque hacemos  todo lo posible para estar disponibles para asuntos urgentes fuera del horario de [place], no estamos disponibles las 24 horas del da, los 7 [address]  [address] de la [place].   Si tiene un problema urgente y no puede comunicarse con nosotros, puede optar por buscar atencin mdica  en el consultorio de su doctor(a), en una clnica privada, en un centro de atencin urgente o en una sala de emergencias.  Si tiene Engineer, drilling, por favor llame inmediatamente al 911 o vaya a la sala de emergencias.  Nmeros de bper  - Dr. Bary Likes: [phone]  - Dra. Annette Barters: [phone]  - Dr. Felipe Horton: [phone]   En caso de inclemencias del tiempo, por favor llame a Lajuan Pila principal al [phone] para una actualizacin sobre el [place] de cualquier retraso o cierre.  Consejos para la medicacin en dermatologa: Por favor, guarde las cajas en las que vienen los medicamentos de uso tpico para ayudarle a seguir las instrucciones sobre dnde y cmo usarlos. Las farmacias generalmente imprimen las instrucciones del medicamento slo en las cajas y no directamente en los tubos del [place].   Si su medicamento es muy caro, por favor, pngase en contacto con Bettyjane Brunet llamando al [phone] y presione la opcin 4 o envenos un mensaje a travs de Clinical cytogeneticist.   No podemos decirle cul ser su copago por los medicamentos por adelantado ya que esto es diferente dependiendo de la cobertura de su seguro. Sin embargo, es posible que podamos encontrar un medicamento sustituto a Audiological scientist un formulario para que el seguro cubra el medicamento que se considera necesario.   Si se requiere una autorizacin previa para que su compaa de seguros [place] su medicamento, por favor permtanos de 1 a 2 das hbiles para completar este proceso.  Los precios de los medicamentos varan con frecuencia dependiendo del Environmental consultant de dnde se surte la receta y alguna farmacias pueden ofrecer precios ms baratos.  El  sitio web www.goodrx.com tiene cupones para medicamentos de Health and safety inspector. Los precios aqu no tienen en cuenta lo que podra costar con la ayuda del seguro (puede ser ms barato con su seguro), pero el sitio web puede darle el precio si no utiliz Tourist information centre manager.  - Puede imprimir el cupn correspondiente y llevarlo con su receta  a la farmacia.  - Tambin puede pasar por nuestra oficina durante el horario de atencin regular y Education officer, museum una tarjeta de cupones de GoodRx.  - Si necesita que su receta se enve electrnicamente a una farmacia diferente, informe a nuestra oficina a travs de MyChart de [place] o por telfono llamando al [phone] y presione la opcin 4.

## 2023-10-10 NOTE — Progress Notes (Unsigned)
 New Patient Visit   Subjective  Ryan Dixon is a 68 y.o. male who presents for the following: Patient here concerning a rash he developed after cancer treatment for his bladder cancer. Reports had a treatment in mid march and then again in April and developed an itchy rash primarily on arms, legs, feet, and hands. Broke out 2 days after both treatments.  Was prescribed tmc cream to use on affected areas but did not help.   The patient has spots, moles and lesions to be evaluated, some may be new or changing and the patient may have concern these could be cancer.  The following portions of the chart were reviewed this encounter and updated as appropriate: medications, allergies, medical history  Review of Systems:  No other skin or systemic complaints except as noted in HPI or Assessment and Plan.  Objective  Well appearing patient in no apparent distress; mood and affect are within normal limits.  A focused examination was performed of the following areas: chest, face, neck, back, b/l arms, b/l hands, b/l legs, b/l thigh, abdomen   Relevant exam findings are noted in the Assessment and Plan.                                                 Chest, abdomen, and back mostly clear, pink scaly plaques at arms and legs b/l and guttate patches with hyperkeratosis of b/l feet and b/l palms see photos   Right Thigh - Anterior Pink red scaly plaque    Assessment & Plan     RASH AND OTHER NONSPECIFIC SKIN ERUPTION Right Thigh - Anterior R/O IMMUNOTHERAPY (bladder CA treatment) drug reaction vs exacerbated psoriasis   Chest, abdomen, and back mostly clear, pink scaly plaques at arms and legs b/l and guttate patches with hyperkeratosis of b/l feet and b/l palms see photos   No family or personal history of psoriasis   Can start otc claritin in am and bendadryl qhs as needed for itch  Start Sernivio 0.05 % Emul - apply topically twice daily  to itchy rash as needed. Avoid face/groin/axilla Rx sent to [facility]. Patient instructed to send mychart if not able to get medication.   Will recheck in 2 weeks   mupirocin  ointment (BACTROBAN ) 2 % - Right Thigh - Anterior Apply topically to wound at right thigh twice daily and cover until healed  Skin / nail biopsy - Right Thigh - Anterior Type of biopsy: punch   Informed consent: discussed and consent obtained   Timeout: patient name, date of birth, surgical site, and procedure verified   Procedure prep:  Patient was prepped and draped in usual sterile fashion (the patient was cleaned and prepped) Prep type:  Isopropyl alcohol Anesthesia: the lesion was anesthetized in a standard fashion   Anesthetic:  1% lidocaine  w/ epinephrine  1-100,000 buffered w/ 8.4% NaHCO3 Punch size:  3.5 mm Suture size:  4-0 Suture type: nylon   Suture removal (days):  14 Hemostasis achieved with: suture, pressure and aluminum chloride   Outcome: patient tolerated procedure well   Post-procedure details: sterile dressing applied and wound care instructions given   Dressing type: bandage, petrolatum and pressure dressing   Specimen 1 - Surgical pathology Differential Diagnosis: R/o immunosuppressant drug reaction  vs exacerbated psoriasis   Check Margins: No Related Medications Betamethasone Dipropionate (SERNIVO) 0.05 % EMUL  Apply topically twice daily to itchy rash as needed. Avoid face, groin, axilla  Return in about 2 weeks (around 10/24/2023) for suture removal rash follow up.  IRandee Busing, CMA, am acting as scribe for Celine Collard, MD.   Documentation: I have reviewed the above documentation for accuracy and completeness, and I agree with the above.  Celine Collard, MD

## 2023-10-10 NOTE — Patient Instructions (Signed)
 Please start taking prednisone 40 mg once daily until you hear back from me next week.   Please take Claritin 10 mg once daily.

## 2023-10-10 NOTE — Progress Notes (Signed)
 The rash and the itchiness is still there, he feels like nothing is helping. He is still not on any medications due to his BP and labs looking good. He is doing good, no new questions or concerns for the doctor today. Patient has his wife with him today, and she mentioned his cough and how she doesn't feel like it has got any but the patient states that he feels like it has improved at least a little bit.

## 2023-10-10 NOTE — Progress Notes (Signed)
 [facility] CONSULT NOTE  Patient Care Team: Monique Ano, MD as PCP - General (Family Medicine) Loreatha Rodney, MD as Consulting Physician (Oncology)   CANCER STAGING   Cancer Staging  Bladder cancer [facility]) Staging form: Urinary Bladder, AJCC 8th Edition - Clinical stage from 12/07/2022: Stage II (cT2, cN0, cM0) - Signed by Roselin Wiemann, MD on 01/02/2023 Stage prefix: Initial diagnosis WHO/ISUP grade (low/high): High Grade Histologic grading system: 2 grade system - Pathologic stage from 07/07/2023: Stage IIIB (ypT2a, pN2, cM0) - Signed by Kennth Vanbenschoten, MD on 08/31/2023 Stage prefix: Post-therapy Response to neoadjuvant therapy: Partial response WHO/ISUP grade (low/high): High Grade Histologic grading system: 2 grade system   ASSESSMENT & PLAN:  Ryan Dixon 68 y.o. male with pmh of HTN, HLD, current smoker referred to Medical Oncology for management of bladder cancer.   # Bladder cancer, high grade, pathologic stage IIIb -Was seen by Dr. Cherylene Corrente for intermittent hematuria.  Underwent cystoscopy with biopsy of mass close to right ureteral orifice, 2 masses from the bladder neck.  All 3 biopsy showed invasive high-grade urothelial carcinoma involving muscularis propria.  -Completed 4 cycles of neoadjuvant dose dense MVAC on 04/06/2023.  Post completion of 4 cycles, developed platinum induced nephrotoxicity.  -Status post cystoprostatectomy with conduit diversion with ureter-enteric anastomosis of bilateral single ureters on 07/07/2023 by Dr. Secundino Dach.  Inguinal hernia was also repaired the time of cystectomy.  -Pathology showed invasive high-grade urothelial cancer invades superficial muscularis propria margins negative.  2/14 lymph nodes positive for metastatic disease. ypT2a, ypN2  -Based on Checkmate 274, on adjuvant nivolumab  for 1 year.  Nivolumab  maintenance started on 09/05/2023.  Developed pruritic maculopapular rash on bilateral forearms lower extremity  and back after cycle 1.  He was started on Kenalog  0.1% twice daily for 2 weeks.  Then uptitrate to 0.5% twice daily.  His rash is worsening.  Confluent on the bilateral thighs and new lesions on the feet making it difficult to walk.  Hold immunotherapy.  With no improvement with topical steroids, started on prednisone 0.5 mg/kg.  Prescription sent for prednisone 40 mg once daily. He is out of town next week and will continue on the same until he follows up in 2 weeks.  Add Claritin 10 mg once daily.  Advised to use heavy moisturizer such as aquaphor, Aveeno or CeraVe.  I have also sent message to [facility] [place] [facility] for their expertise.  -Schedule for CT chest abdomen pelvis with contrast for surveillance.  # Pruritic maculopapular rash -Grade 3.  Covering more than 30% of the body surface area.  Worsening -Management as above. -Hold immunotherapy today.  # CKD -Developed platinum induced nephrotoxicity post completion of cycle 4 dose dense MVAC. -Baseline creatinine between 1.5-2.  Had ureteral stent removal by Dr.Manny  # Normocytic anemia -Likely secondary to anemia of chronic disease from CKD.  # Hypertension-holding HCTZ since blood pressure has been good without it.  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  # Tobacco dependence -Has cut down to 5 cigarettes a day.  Has stopped Chantix   # Access-port  Orders Placed This Encounter  Procedures   CT CHEST ABDOMEN PELVIS W CONTRAST    Standing Status:   Future    Expected Date:   10/23/2023    Expiration Date:   10/09/2024  If indicated for the ordered procedure, I authorize the administration of contrast media per Radiology protocol:   Yes    Does the patient have a contrast media/X-ray dye allergy?:   No    Preferred imaging location?:    [facility]    If indicated for the ordered procedure, I authorize the administration of oral contrast media per Radiology protocol:   Yes   RTC in 2 weeks weeks for MD visit, labs The total time spent in the appointment was 30 minutes encounter with patients including review of chart and various tests results, discussions about plan of care and coordination of care plan   All questions were answered. The patient knows to call the clinic with any problems, questions or concerns. No barriers to learning was detected.  Loreatha Rodney, MD 4/29/20252:55 PM   HISTORY OF PRESENTING ILLNESS:  Ryan Dixon 68 y.o. male with pmh of HTN, HLD, current smoker referred to Medical Oncology for management of bladder cancer.   01/2023 - Within 1 week of initiation of cycle 1 of ddMVAC, patient developed bilateral constant tinnitus and decreased hearing with high-pitched sound. Nephrotoxicity with creatinine peaking at 1.6.  Supported with IV fluids. Evaluated by Dr. Silvestre Drum, ENT.  Diagnosed with sensorineural hearing loss and was advised hearing aid.  Chemotherapy was briefly placed on hold. He likely had some baseline hearing issues which was exacerbated by platinum chemotherapy.  ENT was okay to continue with chemotherapy.  Risk and benefit of continuation of platinum based chemo with worsening ototoxicity was discussed with the patient.  Patient understands and would like to proceed.  Nephrotoxicity was completely resolved with IV fluids.  -Switched to ddMVAC split dose cisplatin  on day 1 and day 2 with cycle 2.   -After completion of cycle 4, creatinine again increased to 1.5.   Interval history Patient was seen today as follow-up, labs on maintenance nivolumab . Patient reports worsening of rash on his bilateral lower extremities, upper extremities and back.  Specifically on bilateral thigh the rash has been more confluent.  Not able to see any normal skin.  It is pruritic.  He has been using  Kenalog  with no improvement.  He also has a new rash appearing on the bilateral feet and it makes it difficult for him to be on feet all day.  I have reviewed his chart and materials related to his cancer extensively and collaborated history with the patient. Summary of oncologic history is as follows: Oncology History  Bladder cancer (HCC)  10/25/2022 Initial Diagnosis   Seen by Dr. Cherylene Corrente for intermittent hematuria since February 2024 along with other urinary symptoms.   10/28/2022 Imaging   CT hematuria workup Showed 2.2 cm bladder mass worrisome for cancer.  Adjacent mild diffuse bladder wall thickening.  Based on the location this may involve the extreme distal aspect of the right UVJ.  Extensive atherosclerotic changes with the occlusion of the right common iliac artery and areas of significant stenosis along the left. Please correlate with any symptoms such as claudication and further workup as clinically appropriate.  CT chest with contrast IMPRESSION: 1. Lung-RADS 2, benign appearance or behavior. Continue annual screening with low-dose chest CT without contrast in 12 months. 2. Similar ascending aortic dilatation at 4.2 cm. Recommend attention on follow-up lung cancer screening CT in 1 year.   11/29/2022 Procedure   Intraoperative findings:  Cystoscopy-urethra normal in caliber without stricture.  Prostate with mild lateral lobe enlargement and moderate bladder neck elevation.  UOs normal-appearing  bilaterally.  The right UO was anterior to the bladder tumor Bladder tumor: 3 cm papillary/nodular tumor superior to right UO and extending laterally; ~ 1 cm papillary tumor anterior bladder neck 10:00; 2-1 cm papillary tumors anterior bladder neck 2:00    11/29/2022 Pathology Results        12/07/2022 Cancer Staging   Staging form: Urinary Bladder, AJCC 8th Edition - Clinical stage from 12/07/2022: Stage II (cT2, cN0, cM0) - Signed by Loreatha Rodney, MD on 01/02/2023 Stage  prefix: Initial diagnosis WHO/ISUP grade (low/high): High Grade Histologic grading system: 2 grade system   01/02/2023 Initial Diagnosis   Bladder cancer (HCC)   01/23/2023 - 04/06/2023 Chemotherapy   Patient is on Treatment Plan : BLADDER DOSE DENSE MVAC q14d     07/07/2023 Cancer Staging   Staging form: Urinary Bladder, AJCC 8th Edition - Pathologic stage from 07/07/2023: Stage IIIB (ypT2a, pN2, cM0) - Signed by Loreatha Rodney, MD on 08/31/2023 Stage prefix: Post-therapy Response to neoadjuvant therapy: Partial response WHO/ISUP grade (low/high): High Grade Histologic grading system: 2 grade system   08/31/2023 -  Chemotherapy   Patient is on Treatment Plan : BLADDER Nivolumab  (240) q14d       MEDICAL HISTORY:  Past Medical History:  Diagnosis Date   Aortic atherosclerosis (HCC)    Chronic foot pain, left    History of hematuria    Hypertension    Other emphysema (HCC)    Other male erectile dysfunction    Pre-diabetes    Pure hypercholesterolemia    Tobacco dependence     SURGICAL HISTORY: Past Surgical History:  Procedure Laterality Date   ANKLE ARTHROSCOPY Left 08/11/2020   Procedure: LEFT ANKLE ARTHROSCOPY AND DEBRIDEMENT;  Surgeon: Timothy Ford, MD;  Location: [facility];  Service: Orthopedics;  Laterality: Left;   BLADDER INSTILLATION N/A 11/29/2022   Procedure: BLADDER INSTILLATION OF GEMCITABINE ;  Surgeon: Geraline Knapp, MD;  Location: [facility];  Service: Urology;  Laterality: N/A;   COLONOSCOPY     CYSTOSCOPY WITH INJECTION N/A 07/07/2023   Procedure: CYSTOSCOPY WITH INJECTION;  Surgeon: Melody Spurling., MD;  Location: [facility];  Service: Urology;  Laterality: N/A;   IR IMAGING GUIDED PORT INSERTION  01/20/2023   PELVIC LYMPH NODE DISSECTION N/A 07/07/2023   Procedure: PELVIC LYMPH NODE DISSECTION;  Surgeon: Melody Spurling., MD;  Location: [facility];  Service: Urology;  Laterality: N/A;   ROBOT ASSISTED LAPAROSCOPIC COMPLETE CYSTECT ILEAL  CONDUIT N/A 07/07/2023   Procedure: XI ROBOTIC ASSISTED LAPAROSCOPIC COMPLETE CYSTECT ILEAL CONDUIT, UMBILICAL HERNIA REPAIR, BILATERAL INGUINAL HERNIA REPAIR;  Surgeon: Melody Spurling., MD;  Location: [facility];  Service: Urology;  Laterality: N/A;   ROBOT ASSISTED LAPAROSCOPIC RADICAL PROSTATECTOMY N/A 07/07/2023   Procedure: XI ROBOTIC ASSISTED LAPAROSCOPIC RADICAL PROSTATECTOMY;  Surgeon: Melody Spurling., MD;  Location: [facility];  Service: Urology;  Laterality: N/A;   TONSILLECTOMY     TRANSURETHRAL RESECTION OF BLADDER TUMOR N/A 11/29/2022   Procedure: TRANSURETHRAL RESECTION OF BLADDER TUMOR (TURBT);  Surgeon: Geraline Knapp, MD;  Location: [facility];  Service: Urology;  Laterality: N/A;    SOCIAL HISTORY: Social History   Socioeconomic History   Marital status: Married    Spouse name: Ethelle Herb   Number of children: Not on file   Years of education: Not on file   Highest education level: Not on file  Occupational History   Not on file  Tobacco Use   Smoking status: Every Day  Current packs/day: 2.00    Average packs/day: 2.0 packs/day for 43.0 years (86.0 ttl pk-yrs)    Types: Cigarettes   Smokeless tobacco: Never  Substance and Sexual Activity   Alcohol use: Yes    Comment: 1-2 beers/day   Drug use: Never   Sexual activity: Not on file  Other Topics Concern   Not on file  Social History Narrative   Not on file   Social Drivers of Health   Financial Resource Strain: Low Risk  (02/08/2023)   Received from [facility]   Overall Financial Resource Strain (CARDIA)    Difficulty of Paying Living Expenses: Not hard at all  Food Insecurity: No Food Insecurity (07/06/2023)   Hunger Vital Sign    Worried About Running Out of Food in the Last Year: Never true    Ran Out of Food in the Last Year: Never true  Transportation Needs: No Transportation Needs (07/06/2023)   PRAPARE - Administrator, Civil Service (Medical): No    Lack of  Transportation (Non-Medical): No  Physical Activity: Not on file  Stress: No Stress Concern Present (01/25/2023)   [employer] of Occupational Health - Occupational Stress Questionnaire    Feeling of Stress : Not at all  Social Connections: Moderately Integrated (07/06/2023)   Social Connection and Isolation Panel [NHANES]    Frequency of Communication with Friends and Family: More than three times a week    Frequency of Social Gatherings with Friends and Family: Once a week    Attends Religious Services: More than 4 times per year    Active Member of [employer] or Organizations: No    Attends Banker Meetings: Never    Marital Status: Married  Catering manager Violence: Not At Risk (07/06/2023)   Humiliation, Afraid, Rape, and Kick questionnaire    Fear of Current or Ex-Partner: No    Emotionally Abused: No    Physically Abused: No    Sexually Abused: No    FAMILY HISTORY: History reviewed. No pertinent family history.  ALLERGIES:  is allergic to nsaids and other.  MEDICATIONS:  Current Outpatient Medications  Medication Sig Dispense Refill   predniSONE (DELTASONE) 10 MG tablet Take 4 tablets (40 mg total) by mouth daily with breakfast for 20 days. 80 tablet 0   atorvastatin  (LIPITOR) 20 MG tablet TAKE 1 TABLET BY MOUTH EVERY DAY AT NIGHT (Patient not taking: Reported on 09/19/2023)     hydrochlorothiazide (HYDRODIURIL) 25 MG tablet Take 25 mg by mouth daily. (Patient not taking: Reported on 09/19/2023)     oxyCODONE  (ROXICODONE ) 5 MG immediate release tablet Take 1 tablet (5 mg total) by mouth every 6 (six) hours as needed for moderate pain (pain score 4-6) or severe pain (pain score 7-10) (post-operatively). (Patient not taking: Reported on 09/26/2023) 15 tablet 0   senna-docusate (SENOKOT-S) 8.6-50 MG tablet Take 1 tablet by mouth 2 (two) times daily. While taking strong pain meds to prevent constipation. (Patient not taking: Reported on 09/26/2023) 10 tablet 0    triamcinolone  ointment (KENALOG ) 0.5 % Apply 1 Application topically 2 (two) times daily. 30 g 0   varenicline  (CHANTIX ) 1 MG tablet Take 1 mg by mouth 2 (two) times daily. (Patient not taking: Reported on 08/31/2023)     Wheat Dextrin (BENEFIBER) POWD Take 0.5 Scoops by mouth daily. (Patient not taking: Reported on 09/19/2023)     No current facility-administered medications for this visit.   Facility-Administered Medications Ordered in Other Visits  Medication Dose Route Frequency Provider Last Rate Last Admin   heparin  lock flush 100 unit/mL  500 Units Intravenous Once Shakela Donati, MD        REVIEW OF SYSTEMS:   Pertinent information mentioned in HPI All other systems were reviewed with the patient and are negative.  PHYSICAL EXAMINATION: ECOG PERFORMANCE STATUS: 0 - Asymptomatic  Vitals:   10/10/23 1308  BP: 130/67  Pulse: 79  Resp: 16  Temp: 98.1 F (36.7 C)  SpO2: 99%    Filed Weights   10/10/23 1308  Weight: 155 lb (70.3 kg)     GENERAL:alert, no distress and comfortable SKIN: skin color, texture, turgor are normal, no rashes or significant lesions EYES: normal, conjunctiva are pink and non-injected, sclera clear OROPHARYNX:no exudate, no erythema and lips, buccal mucosa, and tongue normal  NECK: supple, thyroid  normal size, non-tender, without nodularity LYMPH:  no palpable lymphadenopathy in the cervical, axillary or inguinal LUNGS: clear to auscultation and percussion with normal breathing effort HEART: regular rate & rhythm and no murmurs and no lower extremity edema ABDOMEN:abdomen soft, non-tender and normal bowel sounds Musculoskeletal:no cyanosis of digits and no clubbing  PSYCH: alert & oriented x 3 with fluent speech NEURO: no focal motor/sensory deficits  LABORATORY DATA:  I have reviewed the data as listed Lab Results  Component Value Date   WBC 9.1 10/10/2023   HGB 9.0 (L) 10/10/2023   HCT 27.8 (L) 10/10/2023   MCV 97.2 10/10/2023   PLT 335  10/10/2023   Recent Labs    09/19/23 0851 09/26/23 1359 10/10/23 1245  NA 138 139 138  K 4.4 4.0 4.1  CL 111 111 106  CO2 18* 20* 25  GLUCOSE 120* 124* 126*  BUN 32* 33* 28*  CREATININE 1.84* 1.71* 1.78*  CALCIUM  8.8* 8.6* 8.4*  GFRNONAA 40* 44* 42*  PROT 7.0 7.1 6.7  ALBUMIN  3.7 3.7 3.6  AST 20 17 18   ALT 16 14 16   ALKPHOS 90 100 91  BILITOT 0.5 0.6 0.2    RADIOGRAPHIC STUDIES: I have personally reviewed the radiological images as listed and agreed with the findings in the report. No results found.

## 2023-10-11 ENCOUNTER — Encounter: Payer: Self-pay | Admitting: Dermatology

## 2023-10-11 ENCOUNTER — Other Ambulatory Visit: Payer: Self-pay

## 2023-10-11 LAB — T4: T4, Total: 5.7 ug/dL (ref 4.5–12.0)

## 2023-10-12 ENCOUNTER — Other Ambulatory Visit: Payer: Self-pay

## 2023-10-12 ENCOUNTER — Telehealth: Payer: Self-pay

## 2023-10-12 DIAGNOSIS — R21 Rash and other nonspecific skin eruption: Secondary | ICD-10-CM

## 2023-10-12 MED ORDER — SERNIVO 0.05 % EX EMUL: CUTANEOUS | 4 refills | Status: DC

## 2023-10-12 NOTE — Progress Notes (Signed)
 Sernivo  transfer to [facility]. aw

## 2023-10-12 NOTE — Telephone Encounter (Signed)
 Called patient to see if he was able to get prescription of Sernivo  we had sent to [facility] last week. Patient did not answer. LM for patient to return call if he was unable to get prescription. Dr. Bary Likes would like to send another medication in if patient was unable to get prescription.

## 2023-10-18 ENCOUNTER — Encounter: Payer: Self-pay | Admitting: Dermatology

## 2023-10-18 LAB — SURGICAL PATHOLOGY

## 2023-10-19 ENCOUNTER — Telehealth: Payer: Self-pay

## 2023-10-19 NOTE — Telephone Encounter (Signed)
 Left pt msg to call for bx results/sh

## 2023-10-19 NOTE — Telephone Encounter (Signed)
-----   Message from Celine Collard sent at 10/18/2023  5:45 PM EDT ----- FINAL DIAGNOSIS        1. Skin, right thigh - anterior :       PSORIASIS   Psoriasis Continue Sernivo  lotion Rx Will discuss further at next visit

## 2023-10-19 NOTE — Telephone Encounter (Signed)
 Patient advised of BX results. aw

## 2023-10-23 ENCOUNTER — Ambulatory Visit
Admission: RE | Admit: 2023-10-23 | Discharge: 2023-10-23 | Disposition: A | Discharge status: Home / Self Care | Source: Ambulatory Visit | Attending: Internal Medicine | Admitting: Internal Medicine

## 2023-10-23 ENCOUNTER — Telehealth: Payer: Self-pay | Admitting: *Deleted

## 2023-10-23 DIAGNOSIS — C679 Malignant neoplasm of bladder, unspecified: Secondary | ICD-10-CM

## 2023-10-23 NOTE — Telephone Encounter (Signed)
-----   Message from Loreatha Rodney sent at 10/23/2023 11:53 AM EDT ----- Regarding: CT Hi Shatasia Cutshaw,   I am finishing up my reminder list in inbasket.   He is scheduled for CT c/a/p today afternoon.   Please change the order to 'without' contrast. His kidneys are not doing that good.   Thank you  Kavita

## 2023-10-23 NOTE — Telephone Encounter (Signed)
 New order entered. Msg sent to scheduling and the new order was updated. I cnl the other order for w contrast.

## 2023-10-24 ENCOUNTER — Ambulatory Visit: Admitting: Dermatology

## 2023-10-24 ENCOUNTER — Encounter: Payer: Self-pay | Admitting: Dermatology

## 2023-10-24 DIAGNOSIS — C679 Malignant neoplasm of bladder, unspecified: Secondary | ICD-10-CM | POA: Diagnosis not present

## 2023-10-24 DIAGNOSIS — Z79899 Other long term (current) drug therapy: Secondary | ICD-10-CM | POA: Diagnosis not present

## 2023-10-24 DIAGNOSIS — L409 Psoriasis, unspecified: Secondary | ICD-10-CM

## 2023-10-24 DIAGNOSIS — Z7189 Other specified counseling: Secondary | ICD-10-CM

## 2023-10-24 NOTE — Patient Instructions (Signed)

## 2023-10-24 NOTE — Progress Notes (Signed)
   Follow-Up Visit   Subjective  Ryan Dixon is a 68 y.o. male who presents for the following: Psoriasis bx proven, arms, legs, hands, feet, pt put on prednisone  from oncologist 10/12/23 20 day taper, has improved, pt has not started Sernivo  The patient has spots, moles and lesions to be evaluated, some may be new or changing and the patient may have concern these could be cancer.  The following portions of the chart were reviewed this encounter and updated as appropriate: medications, allergies, medical history  Review of Systems:  No other skin or systemic complaints except as noted in HPI or Assessment and Plan.  Objective  Well appearing patient in no apparent distress; mood and affect are within normal limits.  A focused examination was performed of the following areas: Arms, legs, hands  Relevant exam findings are noted in the Assessment and Plan.   Assessment & Plan   PSORIASIS Biopsy proven 10/10/23 Psoriasis may have been exacerbated by Immunotherapy or by stress on body. Pt advised rash is Psoriasis - NOT an allergic reaction to his Immunotherapy for Bladder Cancer. He is much improved after systemic steroids given by Oncologist. Arms, legs, feet hands Exam: Well-demarcated erythematous papules/plaques with silvery scale, guttate pink scaly papules.  50% BSA on Prednisone , pt originally 80% before starting Prednisone  prescribed by Oncologist.  Chronic and persistent condition with duration or expected duration over one year. Condition is bothersome/symptomatic for patient. Currently flared.  patient denies joint pain  Psoriasis is a chronic non-curable, but treatable genetic/hereditary disease that may have other systemic features affecting other organ systems such as joints (Psoriatic Arthritis). It is associated with an increased risk of inflammatory bowel disease, heart disease, non-alcoholic fatty liver disease, and depression.  Treatments include light and laser  treatments; topical medications; and systemic medications including oral and injectables.  Treatment Plan: Encounter for Removal of Sutures - Incision site at the right ant thigh is clean, dry and intact - Wound cleansed, sutures removed, wound cleansed and steri strips applied.  - Discussed pathology results showing psoriasis  - Patient advised to keep steri-strips dry until they fall off. - Scars remodel for a full year. - Once steri-strips fall off, patient can apply over-the-counter silicone scar cream each night to help with scar remodeling if desired. - Patient advised to call with any concerns or if they notice any new or changing lesions.   Discussed systemic treatments.  Best option to try firs would be oarl treatment Otezla (oral PDE4 inhibitor)  but need to coordinate with Oncologist.  Advised of potential side effects of Headache, GI symptoms and depression and weight loss.   Once off systemic steroids, if Psoriasis starts recurring, can initiate Otezla if Oncologist agrees. Will start at 10 mg every day and titrate up to 30 mg every day.  If tolerated, may increase to 30 bid after follow up if no significant side effect and if pt has some evidence of improvement.  Samples of Otezla 30mg  given x 3 Lot [id number], exp 01/10/25, patient advised not to start until Dr. Bary Likes has consulted with oncologist and pt has finished prednisone    Return in about 8 weeks (around 12/19/2023) for Psoriasis f/u.  I, Rollie Clipper, RMA, am acting as scribe for Celine Collard, MD .   Documentation: I have reviewed the above documentation for accuracy and completeness, and I agree with the above.  Celine Collard, MD

## 2023-10-25 ENCOUNTER — Other Ambulatory Visit: Payer: Self-pay | Admitting: Oncology

## 2023-10-25 ENCOUNTER — Other Ambulatory Visit: Payer: Self-pay

## 2023-10-26 ENCOUNTER — Encounter: Payer: Self-pay | Admitting: Oncology

## 2023-10-26 ENCOUNTER — Inpatient Hospital Stay: Attending: Internal Medicine

## 2023-10-26 ENCOUNTER — Telehealth: Payer: Self-pay

## 2023-10-26 ENCOUNTER — Inpatient Hospital Stay (HOSPITAL_BASED_OUTPATIENT_CLINIC_OR_DEPARTMENT_OTHER): Admitting: Oncology

## 2023-10-26 VITALS — BP 154/70 | HR 72 | Temp 97.9°F | Resp 16 | Ht 70.0 in | Wt 155.8 lb

## 2023-10-26 DIAGNOSIS — D649 Anemia, unspecified: Secondary | ICD-10-CM | POA: Insufficient documentation

## 2023-10-26 DIAGNOSIS — C679 Malignant neoplasm of bladder, unspecified: Secondary | ICD-10-CM

## 2023-10-26 DIAGNOSIS — N289 Disorder of kidney and ureter, unspecified: Secondary | ICD-10-CM | POA: Insufficient documentation

## 2023-10-26 DIAGNOSIS — D72829 Elevated white blood cell count, unspecified: Secondary | ICD-10-CM | POA: Diagnosis not present

## 2023-10-26 LAB — CBC WITH DIFFERENTIAL/PLATELET
Abs Immature Granulocytes: 0.28 10*3/uL — ABNORMAL HIGH (ref 0.00–0.07)
Basophils Absolute: 0 10*3/uL (ref 0.0–0.1)
Basophils Relative: 0 %
Eosinophils Absolute: 0 10*3/uL (ref 0.0–0.5)
Eosinophils Relative: 0 %
HCT: 33.6 % — ABNORMAL LOW (ref 39.0–52.0)
Hemoglobin: 11 g/dL — ABNORMAL LOW (ref 13.0–17.0)
Immature Granulocytes: 2 %
Lymphocytes Relative: 8 %
Lymphs Abs: 1.1 10*3/uL (ref 0.7–4.0)
MCH: 31.3 pg (ref 26.0–34.0)
MCHC: 32.7 g/dL (ref 30.0–36.0)
MCV: 95.5 fL (ref 80.0–100.0)
Monocytes Absolute: 0.6 10*3/uL (ref 0.1–1.0)
Monocytes Relative: 4 %
Neutro Abs: 12 10*3/uL — ABNORMAL HIGH (ref 1.7–7.7)
Neutrophils Relative %: 86 %
Platelets: 334 10*3/uL (ref 150–400)
RBC: 3.52 MIL/uL — ABNORMAL LOW (ref 4.22–5.81)
RDW: 14.6 % (ref 11.5–15.5)
WBC: 13.9 10*3/uL — ABNORMAL HIGH (ref 4.0–10.5)
nRBC: 0 % (ref 0.0–0.2)

## 2023-10-26 LAB — COMPREHENSIVE METABOLIC PANEL WITH GFR
ALT: 32 U/L (ref 0–44)
AST: 24 U/L (ref 15–41)
Albumin: 3.8 g/dL (ref 3.5–5.0)
Alkaline Phosphatase: 69 U/L (ref 38–126)
Anion gap: 7 (ref 5–15)
BUN: 33 mg/dL — ABNORMAL HIGH (ref 8–23)
CO2: 25 mmol/L (ref 22–32)
Calcium: 8.6 mg/dL — ABNORMAL LOW (ref 8.9–10.3)
Chloride: 103 mmol/L (ref 98–111)
Creatinine, Ser: 1.52 mg/dL — ABNORMAL HIGH (ref 0.61–1.24)
GFR, Estimated: 50 mL/min — ABNORMAL LOW (ref >60)
Glucose, Bld: 119 mg/dL — ABNORMAL HIGH (ref 70–99)
Potassium: 5 mmol/L (ref 3.5–5.1)
Sodium: 135 mmol/L (ref 135–145)
Total Bilirubin: 0.4 mg/dL (ref 0.0–1.2)
Total Protein: 6.8 g/dL (ref 6.5–8.1)

## 2023-10-26 NOTE — Progress Notes (Signed)
 [name]  [phone] [phone]  ID: Ryan Dixon OB: [birth year]  MR#: [id number]  GUY#:[id number]  Patient Care Team: Monique Ano, MD as PCP - General (Family Medicine) Loreatha Rodney, MD as Consulting Physician (Oncology) Shellie Dials, MD as Consulting Physician (Oncology)  CHIEF COMPLAINT: Pathologic stage IIIb high-grade urothelial carcinoma.  INTERVAL HISTORY: Patient returns to clinic today for repeat laboratory work and further evaluation.  He has grade 3 rash which he developed after 1 infusion of nivolumab  has essentially resolved.  He currently feels well and is back to his baseline.  He has no neurologic complaints.  He denies any recent fevers or illnesses.  He has a good appetite and denies weight loss.  He has no chest pain, shortness of breath, cough, or hemoptysis.  He denies any nausea, vomiting, constipation, or diarrhea.  He has no urinary complaints.  Patient offers no further specific complaints today.  REVIEW OF SYSTEMS:   Review of Systems  Constitutional: Negative.  Negative for fever, malaise/fatigue and weight loss.  Respiratory: Negative.  Negative for cough, hemoptysis and shortness of breath.   Cardiovascular: Negative.  Negative for chest pain and leg swelling.  Gastrointestinal: Negative.  Negative for abdominal pain.  Genitourinary: Negative.  Negative for dysuria.  Musculoskeletal: Negative.  Negative for back pain.  Skin: Negative.  Negative for itching and rash.  Neurological: Negative.  Negative for dizziness, focal weakness, weakness and headaches.  Psychiatric/Behavioral: Negative.  The patient is not nervous/anxious.     As per HPI. Otherwise, a complete review of systems is negative.  PAST MEDICAL HISTORY: Past Medical History:  Diagnosis Date   Aortic atherosclerosis (HCC)    Chronic foot pain, left    History of hematuria    Hypertension    Other emphysema (HCC)    Other male  erectile dysfunction    Pre-diabetes    Pure hypercholesterolemia    Tobacco dependence     PAST SURGICAL HISTORY: Past Surgical History:  Procedure Laterality Date   ANKLE ARTHROSCOPY Left 08/11/2020   Procedure: LEFT ANKLE ARTHROSCOPY AND DEBRIDEMENT;  Surgeon: Haydyn Liddell Ford, MD;  Location: [facility];  Service: Orthopedics;  Laterality: Left;   BLADDER INSTILLATION N/A 11/29/2022   Procedure: BLADDER INSTILLATION OF GEMCITABINE ;  Surgeon: Geraline Knapp, MD;  Location: [facility];  Service: Urology;  Laterality: N/A;   COLONOSCOPY     CYSTOSCOPY WITH INJECTION N/A 07/07/2023   Procedure: CYSTOSCOPY WITH INJECTION;  Surgeon: Melody Spurling., MD;  Location: [facility];  Service: Urology;  Laterality: N/A;   IR IMAGING GUIDED PORT INSERTION  01/20/2023   PELVIC LYMPH NODE DISSECTION N/A 07/07/2023   Procedure: PELVIC LYMPH NODE DISSECTION;  Surgeon: Melody Spurling., MD;  Location: [facility];  Service: Urology;  Laterality: N/A;   ROBOT ASSISTED LAPAROSCOPIC COMPLETE CYSTECT ILEAL CONDUIT N/A 07/07/2023   Procedure: XI ROBOTIC ASSISTED LAPAROSCOPIC COMPLETE CYSTECT ILEAL CONDUIT, UMBILICAL HERNIA REPAIR, BILATERAL INGUINAL HERNIA REPAIR;  Surgeon: Melody Spurling., MD;  Location: [facility];  Service: Urology;  Laterality: N/A;   ROBOT ASSISTED LAPAROSCOPIC RADICAL PROSTATECTOMY N/A 07/07/2023   Procedure: XI ROBOTIC ASSISTED LAPAROSCOPIC RADICAL PROSTATECTOMY;  Surgeon: Melody Spurling., MD;  Location: [facility];  Service: Urology;  Laterality: N/A;   TONSILLECTOMY     TRANSURETHRAL RESECTION OF BLADDER TUMOR N/A 11/29/2022   Procedure: TRANSURETHRAL RESECTION OF BLADDER TUMOR (TURBT);  Surgeon: Geraline Knapp, MD;  Location: [facility];  Service: Urology;  Laterality: N/A;    FAMILY HISTORY: No family history on file.  ADVANCED DIRECTIVES (Y/N):  N  HEALTH MAINTENANCE: Social History   Tobacco Use   Smoking status: Every Day    Current packs/day: 2.00     Average packs/day: 2.0 packs/day for 43.0 years (86.0 ttl pk-yrs)    Types: Cigarettes   Smokeless tobacco: Never  Substance Use Topics   Alcohol use: Yes    Comment: 1-2 beers/day   Drug use: Never     Colonoscopy:  PAP:  Bone density:  Lipid panel:  Allergies  Allergen Reactions   Nsaids Other (See Comments)    GI upset   Other Other (See Comments)    Anti-inflammatories cause GI upset, N/V    Current Outpatient Medications  Medication Sig Dispense Refill   predniSONE  (DELTASONE ) 10 MG tablet Take 4 tablets (40 mg total) by mouth daily with breakfast for 20 days. 80 tablet 0   atorvastatin  (LIPITOR) 20 MG tablet TAKE 1 TABLET BY MOUTH EVERY DAY AT NIGHT (Patient not taking: Reported on 09/19/2023)     Betamethasone  Dipropionate (SERNIVO ) 0.05 % EMUL Apply topically twice daily to itchy rash as needed. Avoid face, groin, axilla (Patient not taking: Reported on 10/26/2023) 120 mL 4   hydrochlorothiazide (HYDRODIURIL) 25 MG tablet Take 25 mg by mouth daily. (Patient not taking: Reported on 09/19/2023)     mupirocin  ointment (BACTROBAN ) 2 % Apply topically to wound at right thigh twice daily and cover until healed (Patient not taking: Reported on 10/26/2023) 30 g 1   oxyCODONE  (ROXICODONE ) 5 MG immediate release tablet Take 1 tablet (5 mg total) by mouth every 6 (six) hours as needed for moderate pain (pain score 4-6) or severe pain (pain score 7-10) (post-operatively). (Patient not taking: Reported on 10/26/2023) 15 tablet 0   senna-docusate (SENOKOT-S) 8.6-50 MG tablet Take 1 tablet by mouth 2 (two) times daily. While taking strong pain meds to prevent constipation. (Patient not taking: Reported on 10/26/2023) 10 tablet 0   triamcinolone  ointment (KENALOG ) 0.5 % Apply 1 Application topically 2 (two) times daily. (Patient not taking: Reported on 10/26/2023) 30 g 0   varenicline  (CHANTIX ) 1 MG tablet Take 1 mg by mouth 2 (two) times daily. (Patient not taking: Reported on 08/31/2023)     Wheat  Dextrin (BENEFIBER) POWD Take 0.5 Scoops by mouth daily. (Patient not taking: Reported on 09/19/2023)     No current facility-administered medications for this visit.   Facility-Administered Medications Ordered in Other Visits  Medication Dose Route Frequency Provider Last Rate Last Admin   heparin  lock flush 100 unit/mL  500 Units Intravenous Once Agrawal, Kavita, MD        OBJECTIVE: Vitals:   10/26/23 0903  BP: (!) 154/70  Pulse: 72  Resp: 16  Temp: 97.9 F (36.6 C)  SpO2: 100%     Body mass index is 22.35 kg/m.    ECOG FS:0 - Asymptomatic  General: Well-developed, well-nourished, no acute distress. Eyes: Pink conjunctiva, anicteric sclera. HEENT: Normocephalic, moist mucous membranes. Lungs: No audible wheezing or coughing. Heart: Regular rate and rhythm. Abdomen: Soft, nontender, no obvious distention. Musculoskeletal: No edema, cyanosis, or clubbing. Neuro: Alert, answering all questions appropriately. Cranial nerves grossly intact. Skin: No rashes or petechiae noted. Psych: Normal affect.  LAB RESULTS:  Lab Results  Component Value Date   NA 135 10/26/2023   K 5.0 10/26/2023   CL 103 10/26/2023   CO2 25 10/26/2023   GLUCOSE 119 (H) 10/26/2023  BUN 33 (H) 10/26/2023   CREATININE 1.52 (H) 10/26/2023   CALCIUM  8.6 (L) 10/26/2023   PROT 6.8 10/26/2023   ALBUMIN  3.8 10/26/2023   AST 24 10/26/2023   ALT 32 10/26/2023   ALKPHOS 69 10/26/2023   BILITOT 0.4 10/26/2023   GFRNONAA 50 (L) 10/26/2023    Lab Results  Component Value Date   WBC 13.9 (H) 10/26/2023   NEUTROABS 12.0 (H) 10/26/2023   HGB 11.0 (L) 10/26/2023   HCT 33.6 (L) 10/26/2023   MCV 95.5 10/26/2023   PLT 334 10/26/2023     STUDIES: CT CHEST ABDOMEN PELVIS WO CONTRAST Result Date: 10/23/2023 CLINICAL DATA:  Bladder cancer, invasive, assess treatment response Bladder cancer. Monitoring Dx: Malignant neoplasm of urinary bladder, unspecified site C67.9. * Tracking Code: BO * EXAM: CT CHEST,  ABDOMEN AND PELVIS WITHOUT CONTRAST TECHNIQUE: Multidetector CT imaging of the chest, abdomen and pelvis was performed following the standard protocol without IV contrast. RADIATION DOSE REDUCTION: This exam was performed according to the departmental dose-optimization program which includes automated exposure control, adjustment of the mA and/or kV according to patient size and/or use of iterative reconstruction technique. COMPARISON:  CT scan abdomen pelvis from 07/17/2023 and CT scan chest from 05/09/2023. FINDINGS: CT CHEST FINDINGS Cardiovascular: Normal cardiac size. No pericardial effusion. Redemonstration of dilation of ascending aorta measuring up to 4.2 cm in diameter, grossly similar to the prior study. There are coronary artery calcifications, in keeping with coronary artery disease. There are also mild peripheral atherosclerotic vascular calcifications of thoracic aorta and its major branches. Mediastinum/Nodes: Visualized thyroid  gland appears grossly unremarkable. No solid / cystic mediastinal masses. The esophagus is nondistended precluding optimal assessment. No axillary, mediastinal or hilar lymphadenopathy by size criteria. Lungs/Pleura: The central tracheo-bronchial tree is patent. Mild-to-moderate upper lobe predominant centrilobular emphysematous changes noted. No mass or consolidation. No pleural effusion or pneumothorax. There multiple calcified granulomas scattered throughout bilateral lungs with largest measuring up to 6-7 mm. No suspicious lung nodule. Musculoskeletal: A CT Port-a-Cath is seen in the right upper chest wall with the catheter terminating in the cavo-atrial junction region. Visualized soft tissues of the chest wall are otherwise grossly unremarkable. No suspicious osseous lesions. There are mild multilevel degenerative changes in the visualized spine. CT ABDOMEN PELVIS FINDINGS Hepatobiliary: The liver is normal in size. Non-cirrhotic configuration. No suspicious mass. No  intrahepatic or extrahepatic bile duct dilation. No calcified gallstones. Normal gallbladder wall thickness. No pericholecystic inflammatory changes. Pancreas: Unremarkable. No pancreatic ductal dilatation or surrounding inflammatory changes. Spleen: Within normal limits. No focal lesion. Adrenals/Urinary Tract: Adrenal glands are unremarkable. No suspicious renal mass. No hydronephrosis. No renal or ureteric calculi. Urinary bladder is surgically absent. There is ileal conduit with right upper abdominal urostomy. Stomach/Bowel: No disproportionate dilation of the small or large bowel loops. No evidence of abnormal bowel wall thickening or inflammatory changes. The appendix is unremarkable. Note is made of postsurgical changes from recent urostomy. Vascular/Lymphatic: There is small amount of free fluid in the right side of the pelvis, grossly similar to the prior study. No pneumoperitoneum. No abdominal or pelvic lymphadenopathy, by size criteria. No aneurysmal dilation of the major abdominal arteries. There are moderate peripheral atherosclerotic vascular calcifications of the aorta and its major branches. Redemonstration of moderate to severe narrowing of bilateral common iliac arteries. Reproductive: Patient is status post surgical removal of prostate and bilateral seminal vesicles. Other: There is a tiny fat containing umbilical hernia. Right upper quadrant urostomy noted. The soft tissues and abdominal wall are  otherwise unremarkable. Musculoskeletal: No suspicious osseous lesions. There are mild - moderate multilevel degenerative changes in the visualized spine. IMPRESSION: 1. No metastatic bladder cancer seen within the chest, abdomen or pelvis. 2. Multiple other nonacute observations, as described above. Aortic Atherosclerosis (ICD10-I70.0). Electronically Signed   By: Beula Brunswick M.D.   On: 10/23/2023 14:20    ASSESSMENT: Pathologic stage IIIb high-grade urothelial carcinoma.  PLAN:    Pathologic  stage IIIb high-grade urothelial carcinoma: Patient received neoadjuvant chemotherapy with dose dense MVAC completing treatment on April 05, 2023.  He subsequently underwent cystoprostatectomy on July 07, 2023.  Patient was noted to have residual disease and 2 of 14 lymph nodes positive for malignancy.  He started on adjuvant nivolumab  with plans to do 1 year of maintenance treatment, but developed grade 3 rash.  Per dermatology, rash was related to an exacerbation of psoriasis and not a reaction to his immunotherapy.  Rash resolved with topical treatment and systemic steroids.  Despite this, will not reinitiate maintenance treatment.  His most recent imaging on Oct 23, 2023 reviewed independently and report as above with no obvious evidence of recurrent or progressive disease.  No intervention is needed.  Continue follow-up with urology as scheduled.  Return to clinic in 3 months with repeat imaging and further evaluation at which point patient can likely be transition to imaging every 6 months. Psoriasis exacerbation: Improved with systemic steroids.  Proceed with Otezla as per dermatology. Renal insufficiency: Patient developed platinum induced nephrotoxicity after the completion of cycle 4 of his dose dense MVAC.  His most recent creatinine has improved to 1.25.  Monitor. Anemia: Patient's hemoglobin has trended up to 11.0.  Monitor. Leukocytosis: Likely secondary to steroid use.  Monitor.  Patient expressed understanding and was in agreement with this plan. He also understands that He can call clinic at any time with any questions, concerns, or complaints.    Cancer Staging  Bladder cancer [facility]) Staging form: Urinary Bladder, AJCC 8th Edition - Clinical stage from 12/07/2022: Stage II (cT2, cN0, cM0) - Signed by Agrawal, Kavita, MD on 01/02/2023 Stage prefix: Initial diagnosis WHO/ISUP grade (low/high): High Grade Histologic grading system: 2 grade system - Pathologic stage from 07/07/2023:  Stage IIIB (ypT2a, pN2, cM0) - Signed by Agrawal, Kavita, MD on 08/31/2023 Stage prefix: Post-therapy Response to neoadjuvant therapy: Partial response WHO/ISUP grade (low/high): High Grade Histologic grading system: 2 grade system   Shellie Dials, MD   10/26/2023 1:26 PM

## 2023-10-26 NOTE — Progress Notes (Signed)
 Has had a rash break out all over body and was given prednisone  that helped a lot. Treatment was stopped until rash resolved. Has not had tx in 3-4 weeks.

## 2023-10-26 NOTE — Telephone Encounter (Signed)
-----   Message from Shellie Dials sent at 10/25/2023 12:45 PM EDT ----- I will meet him for the first time tomorrow.  I see no problem with starting Otezla.  While there is not a tremendous amount of data, there is no specifically noted interactions between Otezla and nivolumab .  Having said that, it is unclear if nivolumab  will even be restarted.  -Tim ----- Message ----- From: Sheffield Dense, CMA Sent: 10/24/2023   1:30 PM EDT To: Shellie Dials, MD  Patient advised you will be his new oncologist and he has an upcoming appointment with you on Thursday.  Dr. Bary Likes would like to start patient on Otezla for his recent diagnosis of psoriasis.  Attached is today's office visit note.  Please advise if you are ok with patient starting Otezla.  Thank you,    Rollie Clipper, RMA [facility]

## 2023-11-07 ENCOUNTER — Encounter: Payer: Self-pay | Admitting: Oncology

## 2023-11-07 ENCOUNTER — Encounter: Payer: Self-pay | Admitting: Dermatology

## 2023-11-07 ENCOUNTER — Other Ambulatory Visit: Payer: Self-pay | Admitting: *Deleted

## 2023-11-07 MED ORDER — PREDNISONE 10 MG PO TABS: 40.0000 mg | ORAL_TABLET | Freq: Every day | ORAL | 0 refills | Status: DC

## 2023-12-04 ENCOUNTER — Other Ambulatory Visit: Payer: Self-pay | Admitting: Oncology

## 2023-12-18 ENCOUNTER — Other Ambulatory Visit: Payer: Self-pay | Admitting: *Deleted

## 2023-12-18 ENCOUNTER — Encounter: Payer: Self-pay | Admitting: Oncology

## 2023-12-18 MED ORDER — PREDNISONE 10 MG PO TABS: 40.0000 mg | ORAL_TABLET | Freq: Every day | ORAL | 0 refills | Status: DC

## 2023-12-19 ENCOUNTER — Inpatient Hospital Stay: Attending: Internal Medicine

## 2023-12-19 ENCOUNTER — Ambulatory Visit: Admitting: Dermatology

## 2023-12-19 DIAGNOSIS — C679 Malignant neoplasm of bladder, unspecified: Secondary | ICD-10-CM | POA: Diagnosis present

## 2023-12-19 DIAGNOSIS — Z95828 Presence of other vascular implants and grafts: Secondary | ICD-10-CM

## 2023-12-19 MED ORDER — SODIUM CHLORIDE 0.9% FLUSH
10.0000 mL | INTRAVENOUS | Status: DC | PRN
  Administered 2023-12-19: 10 mL via INTRAVENOUS
  Filled 2023-12-19: qty 10

## 2023-12-19 MED ORDER — SODIUM CHLORIDE 0.9% FLUSH
10.0000 mL | Freq: Once | INTRAVENOUS | Status: DC
  Filled 2023-12-19: qty 10

## 2023-12-19 MED ORDER — HEPARIN SOD (PORK) LOCK FLUSH 100 UNIT/ML IV SOLN
500.0000 [IU] | Freq: Once | INTRAVENOUS | Status: AC
  Administered 2023-12-19: 500 [IU] via INTRAVENOUS
  Filled 2023-12-19: qty 5

## 2023-12-19 MED ORDER — HEPARIN SOD (PORK) LOCK FLUSH 100 UNIT/ML IV SOLN
500.0000 [IU] | Freq: Once | INTRAVENOUS | Status: DC
  Filled 2023-12-19: qty 5

## 2023-12-28 ENCOUNTER — Emergency Department

## 2023-12-28 ENCOUNTER — Emergency Department
Admission: EM | Admit: 2023-12-28 | Discharge: 2023-12-28 | Disposition: A | Discharge status: Home / Self Care | Attending: Emergency Medicine | Admitting: Emergency Medicine

## 2023-12-28 ENCOUNTER — Other Ambulatory Visit: Payer: Self-pay

## 2023-12-28 DIAGNOSIS — Z8551 Personal history of malignant neoplasm of bladder: Secondary | ICD-10-CM | POA: Insufficient documentation

## 2023-12-28 DIAGNOSIS — I10 Essential (primary) hypertension: Secondary | ICD-10-CM | POA: Insufficient documentation

## 2023-12-28 DIAGNOSIS — Z87891 Personal history of nicotine dependence: Secondary | ICD-10-CM | POA: Diagnosis not present

## 2023-12-28 DIAGNOSIS — J441 Chronic obstructive pulmonary disease with (acute) exacerbation: Secondary | ICD-10-CM | POA: Diagnosis not present

## 2023-12-28 DIAGNOSIS — R059 Cough, unspecified: Secondary | ICD-10-CM | POA: Diagnosis present

## 2023-12-28 LAB — BASIC METABOLIC PANEL WITH GFR
Anion gap: 9 (ref 5–15)
BUN: 37 mg/dL — ABNORMAL HIGH (ref 8–23)
CO2: 24 mmol/L (ref 22–32)
Calcium: 8.7 mg/dL — ABNORMAL LOW (ref 8.9–10.3)
Chloride: 107 mmol/L (ref 98–111)
Creatinine, Ser: 2.08 mg/dL — ABNORMAL HIGH (ref 0.61–1.24)
GFR, Estimated: 34 mL/min — ABNORMAL LOW (ref >60)
Glucose, Bld: 113 mg/dL — ABNORMAL HIGH (ref 70–99)
Potassium: 4.1 mmol/L (ref 3.5–5.1)
Sodium: 140 mmol/L (ref 135–145)

## 2023-12-28 LAB — CBC WITH DIFFERENTIAL/PLATELET
Abs Immature Granulocytes: 0.29 K/uL — ABNORMAL HIGH (ref 0.00–0.07)
Basophils Absolute: 0 K/uL (ref 0.0–0.1)
Basophils Relative: 0 %
Eosinophils Absolute: 0.1 K/uL (ref 0.0–0.5)
Eosinophils Relative: 1 %
HCT: 36.1 % — ABNORMAL LOW (ref 39.0–52.0)
Hemoglobin: 11.7 g/dL — ABNORMAL LOW (ref 13.0–17.0)
Immature Granulocytes: 3 %
Lymphocytes Relative: 17 %
Lymphs Abs: 2 K/uL (ref 0.7–4.0)
MCH: 31.2 pg (ref 26.0–34.0)
MCHC: 32.4 g/dL (ref 30.0–36.0)
MCV: 96.3 fL (ref 80.0–100.0)
Monocytes Absolute: 1.1 K/uL — ABNORMAL HIGH (ref 0.1–1.0)
Monocytes Relative: 9 %
Neutro Abs: 8.3 K/uL — ABNORMAL HIGH (ref 1.7–7.7)
Neutrophils Relative %: 70 %
Platelets: 235 K/uL (ref 150–400)
RBC: 3.75 MIL/uL — ABNORMAL LOW (ref 4.22–5.81)
RDW: 13.9 % (ref 11.5–15.5)
WBC: 11.8 K/uL — ABNORMAL HIGH (ref 4.0–10.5)
nRBC: 0 % (ref 0.0–0.2)

## 2023-12-28 LAB — RESP PANEL BY RT-PCR (RSV, FLU A&B, COVID)  RVPGX2
Influenza A by PCR: NEGATIVE
Influenza B by PCR: NEGATIVE
Resp Syncytial Virus by PCR: NEGATIVE
SARS Coronavirus 2 by RT PCR: NEGATIVE

## 2023-12-28 MED ORDER — PREDNISONE 10 MG PO TABS: 40.0000 mg | ORAL_TABLET | Freq: Every day | ORAL | 0 refills | Status: AC

## 2023-12-28 MED ORDER — IPRATROPIUM-ALBUTEROL 0.5-2.5 (3) MG/3ML IN SOLN
3.0000 mL | Freq: Once | RESPIRATORY_TRACT | Status: AC
  Administered 2023-12-28: 3 mL via RESPIRATORY_TRACT
  Filled 2023-12-28: qty 3

## 2023-12-28 MED ORDER — ALBUTEROL SULFATE HFA 108 (90 BASE) MCG/ACT IN AERS: 2.0000 | INHALATION_SPRAY | Freq: Four times a day (QID) | RESPIRATORY_TRACT | 2 refills | Status: DC | PRN

## 2023-12-28 MED ORDER — PREDNISONE 20 MG PO TABS
60.0000 mg | ORAL_TABLET | Freq: Once | ORAL | Status: AC
  Administered 2023-12-28: 60 mg via ORAL
  Filled 2023-12-28: qty 3

## 2023-12-28 MED ORDER — AMOXICILLIN-POT CLAVULANATE 875-125 MG PO TABS: 1.0000 | ORAL_TABLET | Freq: Two times a day (BID) | ORAL | 0 refills | Status: AC

## 2023-12-28 NOTE — ED Triage Notes (Signed)
 Pt reports he was working under the house with a paint like silicone this past Tuesday, pt reports since he has had sob cough and congestion. Pt has hx bladder cancer, pt is in remission not currently getting treatment.

## 2023-12-28 NOTE — ED Provider Notes (Signed)
 [facility] Provider Note    Event Date/Time   First MD Initiated Contact with Patient 12/28/23 [phone]     (approximate)   History   Cough   HPI Ryan Dixon is a 68 y.o. male with history of emphysema, HTN presenting today for cough.  Patient states for the past 2 to 3 days he has had worsening cough, congestion, and shortness of breath.  States he was told he might have early stages of emphysema in the past but not on any breathing treatments.  They have noted wheezing at home.  Denies any chest pain, leg pain, leg swelling, body aches, nausea, vomiting.  Also has been repairing leaks in his house with exposure to wet and damp environments.  No fevers.  Reports prior history of significant 2 pack/day smoking for multiple decades.     Physical Exam   Triage Vital Signs: ED Triage Vitals  Encounter Vitals Group     BP 12/28/23 0110 (!) 155/78     Girls Systolic BP Percentile --      Girls Diastolic BP Percentile --      Boys Systolic BP Percentile --      Boys Diastolic BP Percentile --      Pulse Rate 12/28/23 0110 93     Resp 12/28/23 0110 18     Temp 12/28/23 0110 97.9 F (36.6 C)     Temp src --      SpO2 12/28/23 0110 95 %     Weight 12/28/23 0109 160 lb (72.6 kg)     Height 12/28/23 0109 5' 10 (1.778 m)     Head Circumference --      Peak Flow --      Pain Score 12/28/23 0109 0     Pain Loc --      Pain Education --      Exclude from Growth Chart --     Most recent vital signs: Vitals:   12/28/23 0110  BP: (!) 155/78  Pulse: 93  Resp: 18  Temp: 97.9 F (36.6 C)  SpO2: 95%   I have reviewed the vital signs. General:  Awake, alert, no acute distress. Head:  Normocephalic, Atraumatic. EENT:  PERRL, EOMI, Oral mucosa pink and moist, Neck is supple. Cardiovascular: Regular rate, 2+ distal pulses. Respiratory:  Normal respiratory effort, symmetrical expansion, no distress.  Expiratory wheezing noted throughout. Extremities:  Moving  all four extremities through full ROM without pain.   Neuro:  Alert and oriented.  Interacting appropriately.   Skin:  Warm, dry, no rash.   Psych: Appropriate affect.    ED Results / Procedures / Treatments   Labs (all labs ordered are listed, but only abnormal results are displayed) Labs Reviewed  CBC WITH DIFFERENTIAL/PLATELET - Abnormal; Notable for the following components:      Result Value   WBC 11.8 (*)    RBC 3.75 (*)    Hemoglobin 11.7 (*)    HCT 36.1 (*)    Neutro Abs 8.3 (*)    Monocytes Absolute 1.1 (*)    Abs Immature Granulocytes 0.29 (*)    All other components within normal limits  BASIC METABOLIC PANEL WITH GFR - Abnormal; Notable for the following components:   Glucose, Bld 113 (*)    BUN 37 (*)    Creatinine, Ser 2.08 (*)    Calcium  8.7 (*)    GFR, Estimated 34 (*)    All other components within normal limits  RESP  PANEL BY RT-PCR (RSV, FLU A&B, COVID)  [id number]     EKG My EKG interpretation: Rate of 89, normal sinus rhythm, normal axis, normal intervals.  No acute ST elevations or depressions   RADIOLOGY Independently interpreted chest x-ray with no acute pathology   PROCEDURES:  Critical Care performed: No  Procedures   MEDICATIONS ORDERED IN ED: Medications  ipratropium-albuterol  (DUONEB) 0.5-2.5 (3) MG/3ML nebulizer solution 3 mL (3 mLs Nebulization Given 12/28/23 0421)  predniSONE  (DELTASONE ) tablet 60 mg (60 mg Oral Given 12/28/23 0420)     IMPRESSION / MDM / ASSESSMENT AND PLAN / ED COURSE  I reviewed the triage vital signs and the nursing notes.                              Differential diagnosis includes, but is not limited to, COPD/emphysema exacerbation, COVID, flu, RSV, pneumonia  Patient's presentation is most consistent with acute complicated illness / injury requiring diagnostic workup.  Patient is a 68 year old male presenting today for cough, congestion, and wheezing.  On exam, he does have wheezing present with no formal  diagnosis of COPD/emphysema but does have chronic smoking history.  Will give DuoNeb and prednisone  here at this time.  EKG with no concerning findings.  Chest x-ray negative.  CBC largely reassuring.  BMP shows mild AKI on CKD but nothing significant.  Negative for COVID, flu, RSV.  Patient reassessed following treatment and feeling significantly better.  No significant ongoing wheezing present at this time.  Stable for discharge for COPD exacerbation with albuterol  inhaler, prednisone , and Augmentin .  Has PCP follow-up in the next 2 weeks.  Given strict return precautions for worsening breathing symptoms.  Clinical Course as of 12/28/23 0509  Thu Dec 28, 2023  0508 Patient feeling significantly better and no significant ongoing wheezing. [DW]    Clinical Course User Index [DW] Malvina Alm DASEN, MD     FINAL CLINICAL IMPRESSION(S) / ED DIAGNOSES   Final diagnoses:  COPD exacerbation (HCC)     Rx / DC Orders   ED Discharge Orders          Ordered    predniSONE  (DELTASONE ) 10 MG tablet  Daily        12/28/23 0508    albuterol  (VENTOLIN  HFA) 108 (90 Base) MCG/ACT inhaler  Every 6 hours PRN        12/28/23 0508    amoxicillin -clavulanate (AUGMENTIN ) 875-125 MG tablet  2 times daily        12/28/23 0508             Note:  This document was prepared using Dragon voice recognition software and may include unintentional dictation errors.   Malvina Alm DASEN, MD 12/28/23 [phone]

## 2023-12-28 NOTE — Discharge Instructions (Addendum)
 I have sent an albuterol  inhaler, steroids, and antibiotics to your pharmacy.  Please take these as prescribed.  You likely will not need to take the albuterol  after the first 24 hours and can take it as needed after that.  Elsewise, you can do 2 puffs every 4 hours for the first day.  I suspect this is likely a first-time COPD exacerbation and you should follow-up as planned with your primary care provider for ongoing outpatient management.  Please return for any severe or worsening breathing symptoms.

## 2024-01-11 ENCOUNTER — Encounter: Payer: Self-pay | Admitting: Dermatology

## 2024-01-11 ENCOUNTER — Ambulatory Visit: Admitting: Dermatology

## 2024-01-11 DIAGNOSIS — L409 Psoriasis, unspecified: Secondary | ICD-10-CM | POA: Diagnosis not present

## 2024-01-11 DIAGNOSIS — Z79899 Other long term (current) drug therapy: Secondary | ICD-10-CM

## 2024-01-11 DIAGNOSIS — Z7189 Other specified counseling: Secondary | ICD-10-CM

## 2024-01-11 NOTE — Progress Notes (Signed)
   Follow-Up Visit   Subjective  Ryan Dixon is a 68 y.o. male who presents for the following:  8 week psoriasis follow up, bx proven psoriasis that has been exacerbated by stress and immunotherapy on body. Given Sernivio sample but not able to get covered by insurance. Consulted with patient's oncologist regarding starting on Otezla given bladder cancer. Oncologist oked starting Otezla and samples were given.   Patient seen in May and started on Otezla samples. Reports he did not have any side effected like headache,depression or weight loss.Reports some mild GI Symptoms.  Patient reports he has not noticed much improvement with areas.  Ran out of samples 2 weeks ago. Still reports some flares at feet and back of arms.  The following portions of the chart were reviewed this encounter and updated as appropriate: medications, allergies, medical history  Review of Systems:  No other skin or systemic complaints except as noted in HPI or Assessment and Plan.  Objective  Well appearing patient in no apparent distress; mood and affect are within normal limits.  A focused examination was performed of the following areas: B/l hands, b/l arms, b/l feet   Relevant exam findings are noted in the Assessment and Plan.                   Assessment & Plan   PSORIASIS Mild improvement on Otezla for 2 months Biopsy proven 10/10/23 Hx of exacerbation due to immunotherapy or stress on body from treatment of Bladder Cancer.  Arms, legs, feet hands Exam: Well-demarcated erythematous papules/plaques with silvery scale, guttate pink scaly papules at feet, arms, hands. Mild improvement on Otezla See photos   50% BSA and 80% before  Chronic and persistent condition with duration or expected duration over one year. Condition is improving with treatment but not currently at goal. patient denies joint pain   Psoriasis is a chronic non-curable, but treatable genetic/hereditary disease that may  have other systemic features affecting other organ systems such as joints (Psoriatic Arthritis). It is associated with an increased risk of inflammatory bowel disease, heart disease, non-alcoholic fatty liver disease, and depression.  Treatments include light and laser treatments; topical medications; and systemic medications including oral and injectables.   Treatment Plan: Patient denied headache, depression, weight loss some mild GI symptoms while on Otezla - noted mild improvement.   While taking for 2 months Will discontinue Otezla. We discussed other treatment options such as Sotyktu   Will consider Sotyktu if screening labs okay and if approved by his oncologist. Reviewed recent labs from May 2025 and July 2025 ok  Patient states he has no risk factors for HIV Pending biologic start labs and clearance from Oncologist Dr. Jacobo will plan to start Sotyktu     Bladder Cancer  Not currently on treatment  Has been treated with immunotherapy and improved after systemic steriods given by Oncologist  Treated by oncologist Dr. Evalene Jacobo  MEDICATION MANAGEMENT   Related Procedures Hepatitis B surface antibody,qualitative Hepatitis B surface antigen Hepatitis C antibody QuantiFERON-TB Gold Plus PSORIASIS    Return in about 3 months (around 04/12/2024) for psoriasis.  IEleanor Blush, CMA, am acting as scribe for Alm Rhyme, MD.   Documentation: I have reviewed the above documentation for accuracy and completeness, and I agree with the above.  Alm Rhyme, MD

## 2024-01-11 NOTE — Patient Instructions (Signed)

## 2024-01-26 ENCOUNTER — Other Ambulatory Visit
Admission: RE | Admit: 2024-01-26 | Discharge: 2024-01-26 | Disposition: A | Discharge status: Home / Self Care | Attending: Dermatology | Admitting: Dermatology

## 2024-01-26 ENCOUNTER — Ambulatory Visit
Admission: RE | Admit: 2024-01-26 | Discharge: 2024-01-26 | Disposition: A | Discharge status: Home / Self Care | Source: Ambulatory Visit | Attending: Oncology | Admitting: Oncology

## 2024-01-26 ENCOUNTER — Inpatient Hospital Stay: Attending: Internal Medicine

## 2024-01-26 DIAGNOSIS — F17210 Nicotine dependence, cigarettes, uncomplicated: Secondary | ICD-10-CM | POA: Diagnosis not present

## 2024-01-26 DIAGNOSIS — D649 Anemia, unspecified: Secondary | ICD-10-CM | POA: Diagnosis not present

## 2024-01-26 DIAGNOSIS — D72829 Elevated white blood cell count, unspecified: Secondary | ICD-10-CM | POA: Diagnosis not present

## 2024-01-26 DIAGNOSIS — C679 Malignant neoplasm of bladder, unspecified: Secondary | ICD-10-CM | POA: Diagnosis present

## 2024-01-26 DIAGNOSIS — L409 Psoriasis, unspecified: Secondary | ICD-10-CM | POA: Insufficient documentation

## 2024-01-26 DIAGNOSIS — C7951 Secondary malignant neoplasm of bone: Secondary | ICD-10-CM | POA: Diagnosis not present

## 2024-01-26 DIAGNOSIS — N289 Disorder of kidney and ureter, unspecified: Secondary | ICD-10-CM | POA: Diagnosis not present

## 2024-01-26 LAB — CMP (CANCER CENTER ONLY)
ALT: 22 U/L (ref 0–44)
AST: 27 U/L (ref 15–41)
Albumin: 3.5 g/dL (ref 3.5–5.0)
Alkaline Phosphatase: 691 U/L — ABNORMAL HIGH (ref 38–126)
Anion gap: 9 (ref 5–15)
BUN: 37 mg/dL — ABNORMAL HIGH (ref 8–23)
CO2: 23 mmol/L (ref 22–32)
Calcium: 8.5 mg/dL — ABNORMAL LOW (ref 8.9–10.3)
Chloride: 104 mmol/L (ref 98–111)
Creatinine: 1.52 mg/dL — ABNORMAL HIGH (ref 0.61–1.24)
GFR, Estimated: 50 mL/min — ABNORMAL LOW (ref >60)
Glucose, Bld: 188 mg/dL — ABNORMAL HIGH (ref 70–99)
Potassium: 4.5 mmol/L (ref 3.5–5.1)
Sodium: 136 mmol/L (ref 135–145)
Total Bilirubin: 0.6 mg/dL (ref 0.0–1.2)
Total Protein: 6.8 g/dL (ref 6.5–8.1)

## 2024-01-26 LAB — CBC WITH DIFFERENTIAL (CANCER CENTER ONLY)
Abs Immature Granulocytes: 0.48 K/uL — ABNORMAL HIGH (ref 0.00–0.07)
Basophils Absolute: 0 K/uL (ref 0.0–0.1)
Basophils Relative: 0 %
Eosinophils Absolute: 0 K/uL (ref 0.0–0.5)
Eosinophils Relative: 0 %
HCT: 34.6 % — ABNORMAL LOW (ref 39.0–52.0)
Hemoglobin: 11.5 g/dL — ABNORMAL LOW (ref 13.0–17.0)
Immature Granulocytes: 4 %
Lymphocytes Relative: 6 %
Lymphs Abs: 0.8 K/uL (ref 0.7–4.0)
MCH: 31.2 pg (ref 26.0–34.0)
MCHC: 33.2 g/dL (ref 30.0–36.0)
MCV: 93.8 fL (ref 80.0–100.0)
Monocytes Absolute: 0.5 K/uL (ref 0.1–1.0)
Monocytes Relative: 4 %
Neutro Abs: 11.6 K/uL — ABNORMAL HIGH (ref 1.7–7.7)
Neutrophils Relative %: 86 %
Platelet Count: 350 K/uL (ref 150–400)
RBC: 3.69 MIL/uL — ABNORMAL LOW (ref 4.22–5.81)
RDW: 14.1 % (ref 11.5–15.5)
WBC Count: 13.4 K/uL — ABNORMAL HIGH (ref 4.0–10.5)
nRBC: 0 % (ref 0.0–0.2)

## 2024-01-26 MED ORDER — HEPARIN SOD (PORK) LOCK FLUSH 100 UNIT/ML IV SOLN
INTRAVENOUS | Status: AC
  Filled 2024-01-26: qty 5

## 2024-01-26 MED ORDER — HEPARIN SOD (PORK) LOCK FLUSH 100 UNIT/ML IV SOLN
500.0000 [IU] | Freq: Once | INTRAVENOUS | Status: DC
  Filled 2024-01-26: qty 5

## 2024-01-26 MED ORDER — IOHEXOL 300 MG/ML  SOLN
100.0000 mL | Freq: Once | INTRAMUSCULAR | Status: AC | PRN
  Administered 2024-01-26: 100 mL via INTRAVENOUS

## 2024-01-26 NOTE — Patient Instructions (Signed)

## 2024-01-29 ENCOUNTER — Other Ambulatory Visit
Admission: RE | Admit: 2024-01-29 | Discharge: 2024-01-29 | Disposition: A | Discharge status: Home / Self Care | Attending: Dermatology | Admitting: Dermatology

## 2024-01-29 DIAGNOSIS — Z5181 Encounter for therapeutic drug level monitoring: Secondary | ICD-10-CM | POA: Diagnosis present

## 2024-01-29 DIAGNOSIS — Z79899 Other long term (current) drug therapy: Secondary | ICD-10-CM | POA: Diagnosis not present

## 2024-01-29 LAB — HEPATITIS B SURFACE ANTIGEN: Hepatitis B Surface Ag: NONREACTIVE

## 2024-01-29 LAB — HEPATITIS C ANTIBODY: HCV Ab: NONREACTIVE

## 2024-01-30 LAB — MISC LABCORP TEST (SEND OUT): LabCorp test name: 6395

## 2024-01-30 LAB — QUANTIFERON-TB GOLD PLUS (RQFGPL)
QuantiFERON Mitogen Value: 0.13 [IU]/mL
QuantiFERON Nil Value: 0 [IU]/mL
QuantiFERON TB1 Ag Value: 0 [IU]/mL
QuantiFERON TB2 Ag Value: 0 [IU]/mL

## 2024-01-30 LAB — QUANTIFERON-TB GOLD PLUS: QuantiFERON-TB Gold Plus: UNDETERMINED — AB

## 2024-02-01 ENCOUNTER — Ambulatory Visit: Payer: Self-pay | Admitting: Dermatology

## 2024-02-01 DIAGNOSIS — Z79899 Other long term (current) drug therapy: Secondary | ICD-10-CM

## 2024-02-01 DIAGNOSIS — L409 Psoriasis, unspecified: Secondary | ICD-10-CM

## 2024-02-02 ENCOUNTER — Ambulatory Visit: Admitting: Oncology

## 2024-02-05 ENCOUNTER — Encounter: Payer: Self-pay | Admitting: Oncology

## 2024-02-05 ENCOUNTER — Inpatient Hospital Stay (HOSPITAL_BASED_OUTPATIENT_CLINIC_OR_DEPARTMENT_OTHER): Admitting: Oncology

## 2024-02-05 VITALS — BP 116/70 | HR 92 | Temp 97.6°F | Resp 16 | Wt 151.5 lb

## 2024-02-05 DIAGNOSIS — C679 Malignant neoplasm of bladder, unspecified: Secondary | ICD-10-CM | POA: Diagnosis not present

## 2024-02-05 NOTE — Progress Notes (Signed)
 [facility]  [phone] Fax:([phone]  ID: Ryan Dixon OB: 02-14-1956  MR#: [id number]  RDW#:[id number]  Patient Care Team: [name], MD as PCP - General (Family Medicine) Jacobo Ryan PARAS, MD as Consulting Physician (Oncology)  CHIEF COMPLAINT: Stage IV high-grade urothelial carcinoma.  INTERVAL HISTORY: Patient returns to clinic today for repeat laboratory work, further evaluation, and discussion of his imaging results.  He continues to feel well and remains asymptomatic.  He has no neurologic complaints.  He denies any recent fevers or illnesses.  He has a good appetite and denies weight loss.  He has no chest pain, shortness of breath, cough, or hemoptysis.  He denies any abdominal pain.  He denies any nausea, vomiting, constipation, or diarrhea.  He has no urinary complaints.  Patient offers no specific complaints today.  REVIEW OF SYSTEMS:   Review of Systems  Constitutional: Negative.  Negative for fever, malaise/fatigue and weight loss.  Respiratory: Negative.  Negative for cough, hemoptysis and shortness of breath.   Cardiovascular: Negative.  Negative for chest pain and leg swelling.  Gastrointestinal: Negative.  Negative for abdominal pain.  Genitourinary: Negative.  Negative for dysuria.  Musculoskeletal: Negative.  Negative for back pain.  Skin: Negative.  Negative for itching and rash.  Neurological: Negative.  Negative for dizziness, focal weakness, weakness and headaches.  Psychiatric/Behavioral: Negative.  The patient is not nervous/anxious.     As per HPI. Otherwise, a complete review of systems is negative.  PAST MEDICAL HISTORY: Past Medical History:  Diagnosis Date   Aortic atherosclerosis (HCC)    Chronic foot pain, left    History of hematuria    Hypertension    Other emphysema (HCC)    Other male erectile dysfunction    Pre-diabetes    Pure hypercholesterolemia    Tobacco dependence     PAST SURGICAL  HISTORY: Past Surgical History:  Procedure Laterality Date   ANKLE ARTHROSCOPY Left 08/11/2020   Procedure: LEFT ANKLE ARTHROSCOPY AND DEBRIDEMENT;  Surgeon: [name], MD;  Location: [facility];  Service: Orthopedics;  Laterality: Left;   BLADDER INSTILLATION N/A 11/29/2022   Procedure: BLADDER INSTILLATION OF GEMCITABINE ;  Surgeon: [name], MD;  Location: [facility];  Service: Urology;  Laterality: N/A;   COLONOSCOPY     CYSTOSCOPY WITH INJECTION N/A 07/07/2023   Procedure: CYSTOSCOPY WITH INJECTION;  Surgeon: [name], MD;  Location: [facility];  Service: Urology;  Laterality: N/A;   IR IMAGING GUIDED PORT INSERTION  01/20/2023   PELVIC LYMPH NODE DISSECTION N/A 07/07/2023   Procedure: PELVIC LYMPH NODE DISSECTION;  Surgeon: [name], MD;  Location: [facility];  Service: Urology;  Laterality: N/A;   ROBOT ASSISTED LAPAROSCOPIC COMPLETE CYSTECT ILEAL CONDUIT N/A 07/07/2023   Procedure: XI ROBOTIC ASSISTED LAPAROSCOPIC COMPLETE CYSTECT ILEAL CONDUIT, UMBILICAL HERNIA REPAIR, BILATERAL INGUINAL HERNIA REPAIR;  Surgeon: [name], MD;  Location: [facility];  Service: Urology;  Laterality: N/A;   ROBOT ASSISTED LAPAROSCOPIC RADICAL PROSTATECTOMY N/A 07/07/2023   Procedure: XI ROBOTIC ASSISTED LAPAROSCOPIC RADICAL PROSTATECTOMY;  Surgeon: [name], MD;  Location: [facility];  Service: Urology;  Laterality: N/A;   TONSILLECTOMY     TRANSURETHRAL RESECTION OF BLADDER TUMOR N/A 11/29/2022   Procedure: TRANSURETHRAL RESECTION OF BLADDER TUMOR (TURBT);  Surgeon: [name], MD;  Location: [facility];  Service: Urology;  Laterality: N/A;    FAMILY HISTORY: History reviewed. No pertinent family history.  ADVANCED DIRECTIVES (  Y/N):  N  HEALTH MAINTENANCE: Social History   Tobacco Use   Smoking status: Every Day    Current packs/day: 2.00    Average packs/day: 2.0 packs/day for 43.0 years (86.0 ttl pk-yrs)    Types: Cigarettes    Smokeless tobacco: Never  Substance Use Topics   Alcohol use: Yes    Comment: 1-2 beers/day   Drug use: Never     Colonoscopy:  PAP:  Bone density:  Lipid panel:  Allergies  Allergen Reactions   Nsaids Other (See Comments)    GI upset   Other Other (See Comments)    Anti-inflammatories cause GI upset, N/V    Current Outpatient Medications  Medication Sig Dispense Refill   albuterol  (VENTOLIN  HFA) 108 (90 Base) MCG/ACT inhaler Inhale 2 puffs into the lungs every 6 (six) hours as needed for wheezing or shortness of breath. 8 g 2   predniSONE  (DELTASONE ) 10 MG tablet Take 4 tablets (40 mg total) by mouth daily with breakfast. 120 tablet 0   atorvastatin  (LIPITOR) 20 MG tablet TAKE 1 TABLET BY MOUTH EVERY DAY AT NIGHT (Patient not taking: Reported on 02/05/2024)     Betamethasone  Dipropionate (SERNIVO ) 0.05 % EMUL Apply topically twice daily to itchy rash as needed. Avoid face, groin, axilla (Patient not taking: Reported on 02/05/2024) 120 mL 4   hydrochlorothiazide (HYDRODIURIL) 25 MG tablet Take 25 mg by mouth daily. (Patient not taking: Reported on 02/05/2024)     mupirocin  ointment (BACTROBAN ) 2 % Apply topically to wound at right thigh twice daily and cover until healed (Patient not taking: Reported on 02/05/2024) 30 g 1   oxyCODONE  (ROXICODONE ) 5 MG immediate release tablet Take 1 tablet (5 mg total) by mouth every 6 (six) hours as needed for moderate pain (pain score 4-6) or severe pain (pain score 7-10) (post-operatively). (Patient not taking: Reported on 02/05/2024) 15 tablet 0   senna-docusate (SENOKOT-S) 8.6-50 MG tablet Take 1 tablet by mouth 2 (two) times daily. While taking strong pain meds to prevent constipation. (Patient not taking: Reported on 02/05/2024) 10 tablet 0   triamcinolone  ointment (KENALOG ) 0.5 % Apply 1 Application topically 2 (two) times daily. (Patient not taking: Reported on 02/05/2024) 30 g 0   varenicline  (CHANTIX ) 1 MG tablet Take 1 mg by mouth 2 (two) times  daily. (Patient not taking: Reported on 02/05/2024)     Wheat Dextrin (BENEFIBER) POWD Take 0.5 Scoops by mouth daily. (Patient not taking: Reported on 02/05/2024)     No current facility-administered medications for this visit.   Facility-Administered Medications Ordered in Other Visits  Medication Dose Route Frequency Provider Last Rate Last Admin   heparin  lock flush 100 unit/mL  500 Units Intravenous Once Agrawal, Kavita, MD        OBJECTIVE: Vitals:   02/05/24 0937  BP: 116/70  Pulse: 92  Resp: 16  Temp: 97.6 F (36.4 C)  SpO2: 100%     Body mass index is 21.74 kg/m.    ECOG FS:0 - Asymptomatic  General: Well-developed, well-nourished, no acute distress. Eyes: Pink conjunctiva, anicteric sclera. HEENT: Normocephalic, moist mucous membranes. Lungs: No audible wheezing or coughing. Heart: Regular rate and rhythm. Abdomen: Soft, nontender, no obvious distention. Musculoskeletal: No edema, cyanosis, or clubbing. Neuro: Alert, answering all questions appropriately. Cranial nerves grossly intact. Skin: No rashes or petechiae noted. Psych: Normal affect.  LAB RESULTS:  Lab Results  Component Value Date   NA 136 01/26/2024   K 4.5 01/26/2024   CL 104 01/26/2024  CO2 23 01/26/2024   GLUCOSE 188 (H) 01/26/2024   BUN 37 (H) 01/26/2024   CREATININE 1.52 (H) 01/26/2024   CALCIUM  8.5 (L) 01/26/2024   PROT 6.8 01/26/2024   ALBUMIN  3.5 01/26/2024   AST 27 01/26/2024   ALT 22 01/26/2024   ALKPHOS 691 (H) 01/26/2024   BILITOT 0.6 01/26/2024   GFRNONAA 50 (L) 01/26/2024    Lab Results  Component Value Date   WBC 13.4 (H) 01/26/2024   NEUTROABS 11.6 (H) 01/26/2024   HGB 11.5 (L) 01/26/2024   HCT 34.6 (L) 01/26/2024   MCV 93.8 01/26/2024   PLT 350 01/26/2024     STUDIES: CT CHEST ABDOMEN PELVIS W CONTRAST Result Date: 02/01/2024 EXAM:  CT CHEST ABDOMEN PELVIS WITH IV CONTRAST INDICATION:  Malignant neoplasm of urinary bladder, unspecified site TECHNIQUE: Spiral CT  scanning was performed through the chest, abdomen and pelvis after the patient received intravenous contrast. COMPARISON: 10/23/2023 FINDINGS: The cardiac size is within normal limits. There is no thoracic aortic aneurysm. No filling defects are identified in the central pulmonary arteries. The thyroid  gland and esophagus are within normal limits. No mass or adenopathy is identified in the chest. There are no pleural or pericardial effusions. A right-sided chest wall port is present. There is been no change in the upper lobe predominant centrilobular emphysema. No suspicious pulmonary mass has developed. Innumerable low-attenuation hepatic masses have developed measuring up to 2.2 cm. There is no hepatic or portal venous thrombosis. No biliary ductal dilatation is present. There is no significant abnormality identified in the spleen, pancreas and gallbladder. No renal calculus, obstruction, or soft tissue mass is present. There are no adrenal masses. The ileal conduit and right-sided urostomy are unchanged. There is no bowel dilatation. No adenopathy is identified. No abdominal aortic aneurysm is present. The patient is status post cystoprostatectomy. There is no recurrent mass. There is mild free fluid in the dependent portion of the pelvis, increased from the previous examination. Sigmoid diverticulosis is present with no associated inflammation. Numerous small areas of sclerosis have developed throughout the visualized bones. IMPRESSION: 1. Interval development of diffuse hepatic metastases. 2. Interval development of diffuse osseous metastases. 3. Nonspecific free pelvic fluid, increased from previous examination. 4. No local tumor recurrence. Please note that CT scanning at this site utilizes multiple dose reduction techniques, including automatic exposure control, adjustment of the MAA and/or KVP according to the patient's size, and use of iterative reconstruction. Electronically signed by: Eddy Oar MD  02/01/2024 12:25 PM EDT RP Workstation: [id number]    ASSESSMENT: Stage IV high-grade urothelial carcinoma.  PLAN:    Stage IV high-grade urothelial carcinoma: Patient was initially stage IIIb and received neoadjuvant chemotherapy with dose dense MVAC completing treatment on April 05, 2023.  He subsequently underwent cystoprostatectomy on July 07, 2023.  Patient was noted to have residual disease and 2 of 14 lymph nodes positive for malignancy.  He started on adjuvant nivolumab  with plans to do 1 year of maintenance treatment, but developed grade 3 rash.  Per dermatology, rash was related to an exacerbation of psoriasis and not a reaction to his immunotherapy.  Rash resolved with topical treatment and systemic steroids.  Despite this, patient elected to not to reinitiate maintenance treatment.  Imaging on Oct 23, 2023 with no obvious evidence of recurrent or progressive disease.  Repeat imaging on January 26, 2024 revealed new widespread metastatic disease involving liver and bone.  Case discussed with dermatology and current plan is to proceed with treatment using enfortumab  vedotin and pembrolizumab if patient can be transitioned off his prednisone .  Patient will have video-assisted telemedicine visit on Thursday for treatment planning.  He will then initiate treatment after he returns from his deep sea fishing trip in [place]  in approximately 2 weeks.  Psoriasis exacerbation: Case discussed with dermatology.  Pending final infectious workup for hepatitis B, C, and TB, okay from an oncology standpoint to initiate Sotyku. Renal insufficiency: Patient developed platinum induced nephrotoxicity after the completion of cycle 4 of his dose dense MVAC.  His most recent creatinine is 1.52.  Monitor.   Anemia: Hemoglobin mildly improved to 11.5.  Monitor.   Leukocytosis: Likely secondary to steroid use.  Monitor.  I spent a total of 30 minutes reviewing chart data, face-to-face evaluation with the  patient, counseling and coordination of care as detailed above.   Patient expressed understanding and was in agreement with this plan. He also understands that He can call clinic at any time with any questions, concerns, or complaints.    Cancer Staging  Bladder cancer [facility]) Staging form: Urinary Bladder, AJCC 8th Edition - Clinical stage from 12/07/2022: Stage II (cT2, cN0, cM0) - Signed by Agrawal, Kavita, MD on 01/02/2023 Stage prefix: Initial diagnosis WHO/ISUP grade (low/high): High Grade Histologic grading system: 2 grade system - Pathologic stage from 07/07/2023: Stage IIIB (ypT2a, ypN2, cM0) - Signed by Agrawal, Kavita, MD on 08/31/2023 Stage prefix: Post-therapy Response to neoadjuvant therapy: Partial response WHO/ISUP grade (low/high): High Grade Histologic grading system: 2 grade system   Ryan JINNY Reusing, MD   02/05/2024 5:10 PM

## 2024-02-05 NOTE — Progress Notes (Signed)
 DISCONTINUE ON PATHWAY REGIMEN - Bladder     A cycle is every 14 days:     Nivolumab    **Always confirm dose/schedule in your pharmacy ordering system**  PRIOR TREATMENT: [id number]: Nivolumab  240 mg q14 Days for up to 1 Year of Therapy  START ON PATHWAY REGIMEN - Bladder     A cycle is every 21 days:     Enfortumab vedotin-ejfv      Pembrolizumab   **Always confirm dose/schedule in your pharmacy ordering system**  Patient Characteristics: Advanced/Metastatic Disease, First Line Therapeutic Status: Advanced/Metastatic Disease Line of Therapy: First Line Intent of Therapy: Non-Curative / Palliative Intent, Discussed with Patient

## 2024-02-05 NOTE — Telephone Encounter (Signed)
 Patient returned Ryan Dixon's call and he was advised of that information.   He states he just seen Oncologist and there as been a major set back in his cancer diagnosis and his oncologist is suppose to be reaching out to Dr. Hester. aw

## 2024-02-05 NOTE — Telephone Encounter (Addendum)
 Called  [facility] regarding results for Hepatitis B surface Antibody . They will fax results to our office awaiting fax.   Tried calling patient regarding need to repeat QTB Gold lab work. No answer. LM for patient to call back. Printed order for lab and put paper work at front desk for pickup.  Will contact Oncologist for approval once all lab work is completed.      ----- Message from Alm Rhyme sent at 02/01/2024 11:45 AM EDT ----- Labs from 01/29/2024 showed: Hepatitis C = Non-reactive / normal Hepatitis B Surface Ag Nonreactive/Normal  Hepatitis B surface Antibody - Pending - Please contact lab and ask about result  Quantiferon Gold / TB test = INTERMEDIATE Needs to be repeated next week - give order to pt.  If labs come back OK, may start Sotyktu for Psoriasis pending Oncologist approval.  If Oncologist has not reviewed and commended, please ask Oncologist to review and comment (after all lab back) ----- Message ----- From: Rebecka, Lab In [place] Sent: 01/29/2024   4:59 PM EDT To: Alm JAYSON Rhyme, MD

## 2024-02-05 NOTE — Telephone Encounter (Signed)
 Hep B surface results in media for review. aw

## 2024-02-06 ENCOUNTER — Other Ambulatory Visit: Payer: Self-pay

## 2024-02-06 NOTE — Telephone Encounter (Signed)
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  We can still consider the oral pill, Sotyktu for his Psoriasis and this has been discussed with Dr  Jacobo.  We must get a NEGATIVE Quantiferon gold TB test first though. ----- Message ----- From: Teresa Alan SAUNDERS, CMA Sent: 02/05/2024  11:06 AM EDT To: Alm JAYSON Rhyme, MD  ----- Message from Alan SAUNDERS Teresa, CMA sent at 02/05/2024 11:06 AM EDT -----

## 2024-02-07 ENCOUNTER — Telehealth: Payer: Self-pay | Admitting: *Deleted

## 2024-02-07 ENCOUNTER — Encounter: Payer: Self-pay | Admitting: *Deleted

## 2024-02-07 NOTE — Telephone Encounter (Signed)
 RN reached out to patient to discuss prednisone  taper as advised by Dr. Jacobo. Advised patient that Dr. Jacobo had discussed prednisone  taper with Dr. Hester and it is ok to taper at this time. Patient advised of the following:   Prednisone  30 mg once a day for 5 days, 20 mg once a day for 5 days, 10 mg once a day for 5 days, 5 mg once a day for 5 days.  Patient verbalized understanding and is in agreement with plan. Patient will have virtual visit as scheduled tomorrow afternoon with Dr. Jacobo to further discuss treatment.   MyChart message sent also with taper instructions.

## 2024-02-08 ENCOUNTER — Inpatient Hospital Stay (HOSPITAL_BASED_OUTPATIENT_CLINIC_OR_DEPARTMENT_OTHER): Admitting: Oncology

## 2024-02-08 ENCOUNTER — Encounter: Payer: Self-pay | Admitting: Oncology

## 2024-02-08 DIAGNOSIS — C679 Malignant neoplasm of bladder, unspecified: Secondary | ICD-10-CM

## 2024-02-08 MED ORDER — CARBOXYMETHYLCELL-GLYCERIN PF 0.5-0.9 % OP SOLN: 2.0000 [drp] | Freq: Four times a day (QID) | OPHTHALMIC | 11 refills | Status: DC

## 2024-02-08 MED ORDER — PROCHLORPERAZINE MALEATE 10 MG PO TABS: 10.0000 mg | ORAL_TABLET | Freq: Four times a day (QID) | ORAL | 1 refills | Status: DC | PRN

## 2024-02-08 MED ORDER — ONDANSETRON HCL 8 MG PO TABS: 8.0000 mg | ORAL_TABLET | Freq: Three times a day (TID) | ORAL | 1 refills | Status: DC | PRN

## 2024-02-08 MED ORDER — LIDOCAINE-PRILOCAINE 2.5-2.5 % EX CREA: TOPICAL_CREAM | CUTANEOUS | 3 refills | Status: DC

## 2024-02-08 NOTE — Progress Notes (Signed)
 [facility]  [phone] [phone]  ID: Ryan Dixon OB: 24-May-1956  MR#: [id number]  RDW#:[id number]  Patient Care Team: [name], MD as PCP - General (Family Medicine) [name], MD as Consulting Physician (Oncology)  I connected with Ryan Dixon on 02/08/24 at  2:30 PM EDT by video enabled telemedicine visit and verified that I am speaking with the correct person using two identifiers.   I discussed the limitations, risks, security and privacy concerns of performing an evaluation and management service by telemedicine and the availability of in-person appointments. I also discussed with the patient that there may be a patient responsible charge related to this service. The patient expressed understanding and agreed to proceed.   Other persons participating in the visit and their role in the encounter: Patient, MD.  Patient's location: Home. Provider's location: Clinic.  CHIEF COMPLAINT: Stage IV high-grade urothelial carcinoma.  INTERVAL HISTORY: Patient agreed to video-assisted telemedicine visit for further evaluation and discussion of his treatment plan.  He has now initiated his steroid taper.  He currently feels well and is asymptomatic. He has no neurologic complaints.  He denies any recent fevers or illnesses.  He has a good appetite and denies weight loss.  He has no chest pain, shortness of breath, cough, or hemoptysis.  He denies any abdominal pain.  He denies any nausea, vomiting, constipation, or diarrhea.  He has no urinary complaints.  Patient offers no specific complaints today.  REVIEW OF SYSTEMS:   Review of Systems  Constitutional: Negative.  Negative for fever, malaise/fatigue and weight loss.  Respiratory: Negative.  Negative for cough, hemoptysis and shortness of breath.   Cardiovascular: Negative.  Negative for chest pain and leg swelling.  Gastrointestinal: Negative.  Negative for abdominal pain.   Genitourinary: Negative.  Negative for dysuria.  Musculoskeletal: Negative.  Negative for back pain.  Skin: Negative.  Negative for itching and rash.  Neurological: Negative.  Negative for dizziness, focal weakness, weakness and headaches.  Psychiatric/Behavioral: Negative.  The patient is not nervous/anxious.     As per HPI. Otherwise, a complete review of systems is negative.  PAST MEDICAL HISTORY: Past Medical History:  Diagnosis Date   Aortic atherosclerosis (HCC)    Chronic foot pain, left    History of hematuria    Hypertension    Other emphysema (HCC)    Other male erectile dysfunction    Pre-diabetes    Pure hypercholesterolemia    Tobacco dependence     PAST SURGICAL HISTORY: Past Surgical History:  Procedure Laterality Date   ANKLE ARTHROSCOPY Left 08/11/2020   Procedure: LEFT ANKLE ARTHROSCOPY AND DEBRIDEMENT;  Surgeon: [name], MD;  Location: [facility];  Service: Orthopedics;  Laterality: Left;   BLADDER INSTILLATION N/A 11/29/2022   Procedure: BLADDER INSTILLATION OF GEMCITABINE ;  Surgeon: [name], MD;  Location: [facility];  Service: Urology;  Laterality: N/A;   COLONOSCOPY     CYSTOSCOPY WITH INJECTION N/A 07/07/2023   Procedure: CYSTOSCOPY WITH INJECTION;  Surgeon: [name], MD;  Location: [facility];  Service: Urology;  Laterality: N/A;   IR IMAGING GUIDED PORT INSERTION  01/20/2023   PELVIC LYMPH NODE DISSECTION N/A 07/07/2023   Procedure: PELVIC LYMPH NODE DISSECTION;  Surgeon: [name], MD;  Location: [facility];  Service: Urology;  Laterality: N/A;   ROBOT ASSISTED LAPAROSCOPIC COMPLETE CYSTECT ILEAL CONDUIT N/A 07/07/2023   Procedure: XI ROBOTIC ASSISTED LAPAROSCOPIC COMPLETE CYSTECT ILEAL CONDUIT, UMBILICAL  HERNIA REPAIR, BILATERAL INGUINAL HERNIA REPAIR;  Surgeon: [name], MD;  Location: [facility];  Service: Urology;  Laterality: N/A;   ROBOT ASSISTED LAPAROSCOPIC RADICAL PROSTATECTOMY N/A 07/07/2023    Procedure: XI ROBOTIC ASSISTED LAPAROSCOPIC RADICAL PROSTATECTOMY;  Surgeon: [name], MD;  Location: [facility];  Service: Urology;  Laterality: N/A;   TONSILLECTOMY     TRANSURETHRAL RESECTION OF BLADDER TUMOR N/A 11/29/2022   Procedure: TRANSURETHRAL RESECTION OF BLADDER TUMOR (TURBT);  Surgeon: [name], MD;  Location: [facility];  Service: Urology;  Laterality: N/A;    FAMILY HISTORY: History reviewed. No pertinent family history.  ADVANCED DIRECTIVES (Y/N):  N  HEALTH MAINTENANCE: Social History   Tobacco Use   Smoking status: Every Day    Current packs/day: 2.00    Average packs/day: 2.0 packs/day for 43.0 years (86.0 ttl pk-yrs)    Types: Cigarettes   Smokeless tobacco: Never  Substance Use Topics   Alcohol use: Yes    Comment: 1-2 beers/day   Drug use: Never     Colonoscopy:  PAP:  Bone density:  Lipid panel:  Allergies  Allergen Reactions   Nsaids Other (See Comments)    GI upset   Other Other (See Comments)    Anti-inflammatories cause GI upset, N/V    Current Outpatient Medications  Medication Sig Dispense Refill   albuterol  (VENTOLIN  HFA) 108 (90 Base) MCG/ACT inhaler Inhale 2 puffs into the lungs every 6 (six) hours as needed for wheezing or shortness of breath. 8 g 2   atorvastatin  (LIPITOR) 20 MG tablet TAKE 1 TABLET BY MOUTH EVERY DAY AT NIGHT (Patient not taking: Reported on 02/05/2024)     Betamethasone  Dipropionate (SERNIVO ) 0.05 % EMUL Apply topically twice daily to itchy rash as needed. Avoid face, groin, axilla (Patient not taking: Reported on 02/05/2024) 120 mL 4   hydrochlorothiazide (HYDRODIURIL) 25 MG tablet Take 25 mg by mouth daily. (Patient not taking: Reported on 02/05/2024)     mupirocin  ointment (BACTROBAN ) 2 % Apply topically to wound at right thigh twice daily and cover until healed (Patient not taking: Reported on 02/05/2024) 30 g 1   oxyCODONE  (ROXICODONE ) 5 MG immediate release tablet Take 1 tablet (5 mg total) by mouth  every 6 (six) hours as needed for moderate pain (pain score 4-6) or severe pain (pain score 7-10) (post-operatively). (Patient not taking: Reported on 02/05/2024) 15 tablet 0   predniSONE  (DELTASONE ) 10 MG tablet Take 4 tablets (40 mg total) by mouth daily with breakfast. 120 tablet 0   senna-docusate (SENOKOT-S) 8.6-50 MG tablet Take 1 tablet by mouth 2 (two) times daily. While taking strong pain meds to prevent constipation. (Patient not taking: Reported on 02/05/2024) 10 tablet 0   triamcinolone  ointment (KENALOG ) 0.5 % Apply 1 Application topically 2 (two) times daily. (Patient not taking: Reported on 02/05/2024) 30 g 0   varenicline  (CHANTIX ) 1 MG tablet Take 1 mg by mouth 2 (two) times daily. (Patient not taking: Reported on 02/05/2024)     Wheat Dextrin (BENEFIBER) POWD Take 0.5 Scoops by mouth daily. (Patient not taking: Reported on 02/05/2024)     No current facility-administered medications for this visit.   Facility-Administered Medications Ordered in Other Visits  Medication Dose Route Frequency Provider Last Rate Last Admin   heparin  lock flush 100 unit/mL  500 Units Intravenous Once Agrawal, Kavita, MD        OBJECTIVE: There were no vitals filed for this visit.    There is no  height or weight on file to calculate BMI.    ECOG FS:0 - Asymptomatic  General: Well-developed, well-nourished, no acute distress. HEENT: Normocephalic. Neuro: Alert, answering all questions appropriately. Cranial nerves grossly intact. Psych: Normal affect.  LAB RESULTS:  Lab Results  Component Value Date   NA 136 01/26/2024   K 4.5 01/26/2024   CL 104 01/26/2024   CO2 23 01/26/2024   GLUCOSE 188 (H) 01/26/2024   BUN 37 (H) 01/26/2024   CREATININE 1.52 (H) 01/26/2024   CALCIUM  8.5 (L) 01/26/2024   PROT 6.8 01/26/2024   ALBUMIN  3.5 01/26/2024   AST 27 01/26/2024   ALT 22 01/26/2024   ALKPHOS 691 (H) 01/26/2024   BILITOT 0.6 01/26/2024   GFRNONAA 50 (L) 01/26/2024    Lab Results  Component  Value Date   WBC 13.4 (H) 01/26/2024   NEUTROABS 11.6 (H) 01/26/2024   HGB 11.5 (L) 01/26/2024   HCT 34.6 (L) 01/26/2024   MCV 93.8 01/26/2024   PLT 350 01/26/2024     STUDIES: CT CHEST ABDOMEN PELVIS W CONTRAST Result Date: 02/01/2024 EXAM:  CT CHEST ABDOMEN PELVIS WITH IV CONTRAST INDICATION:  Malignant neoplasm of urinary bladder, unspecified site TECHNIQUE: Spiral CT scanning was performed through the chest, abdomen and pelvis after the patient received intravenous contrast. COMPARISON: 10/23/2023 FINDINGS: The cardiac size is within normal limits. There is no thoracic aortic aneurysm. No filling defects are identified in the central pulmonary arteries. The thyroid  gland and esophagus are within normal limits. No mass or adenopathy is identified in the chest. There are no pleural or pericardial effusions. A right-sided chest wall port is present. There is been no change in the upper lobe predominant centrilobular emphysema. No suspicious pulmonary mass has developed. Innumerable low-attenuation hepatic masses have developed measuring up to 2.2 cm. There is no hepatic or portal venous thrombosis. No biliary ductal dilatation is present. There is no significant abnormality identified in the spleen, pancreas and gallbladder. No renal calculus, obstruction, or soft tissue mass is present. There are no adrenal masses. The ileal conduit and right-sided urostomy are unchanged. There is no bowel dilatation. No adenopathy is identified. No abdominal aortic aneurysm is present. The patient is status post cystoprostatectomy. There is no recurrent mass. There is mild free fluid in the dependent portion of the pelvis, increased from the previous examination. Sigmoid diverticulosis is present with no associated inflammation. Numerous small areas of sclerosis have developed throughout the visualized bones. IMPRESSION: 1. Interval development of diffuse hepatic metastases. 2. Interval development of diffuse osseous  metastases. 3. Nonspecific free pelvic fluid, increased from previous examination. 4. No local tumor recurrence. Please note that CT scanning at this site utilizes multiple dose reduction techniques, including automatic exposure control, adjustment of the MAA and/or KVP according to the patient's size, and use of iterative reconstruction. Electronically signed by: Eddy Oar MD 02/01/2024 12:25 PM EDT RP Workstation: [id number]    ASSESSMENT: Stage IV high-grade urothelial carcinoma.  PLAN:    Stage IV high-grade urothelial carcinoma: Patient was initially stage IIIb and received neoadjuvant chemotherapy with dose dense MVAC completing treatment on April 05, 2023.  He subsequently underwent cystoprostatectomy on July 07, 2023.  Patient was noted to have residual disease and 2 of 14 lymph nodes positive for malignancy.  He started on adjuvant nivolumab  with plans to do 1 year of maintenance treatment, but developed grade 3 rash.  Per dermatology, rash was related to an exacerbation of psoriasis and not a reaction to his immunotherapy.  Rash  resolved with topical treatment and systemic steroids.  Despite this, patient elected to not to reinitiate maintenance treatment.  Imaging on Oct 23, 2023 with no obvious evidence of recurrent or progressive disease.  Repeat imaging on January 26, 2024 revealed new widespread metastatic disease involving liver and bone.  Case discussed with dermatology and current plan is to proceed with treatment using pembrolizumab and enfortumab vedotin on day 1 with Enfortumab only on day 8.  This will be a 21-day cycle.  Patient is going on a fishing trip to [place] , therefore return to clinic on February 23, 2024 to initiate cycle 1, day 1.  Psoriasis exacerbation: Case discussed with dermatology.  Pending final infectious workup for hepatitis B, C, and TB, okay from an oncology standpoint to initiate Sotyku.  Patient is now tapering off steroids. Renal insufficiency:  Patient developed platinum induced nephrotoxicity after the completion of cycle 4 of his dose dense MVAC.  His most recent creatinine is 1.52.  Monitor.   Anemia: Hemoglobin mildly improved to 11.5.  Monitor.   Leukocytosis: Likely secondary to steroid use.  Monitor.  I provided 20 minutes of face-to-face video visit time during this encounter which included chart review, counseling, and coordination of care as documented above.    Patient expressed understanding and was in agreement with this plan. He also understands that He can call clinic at any time with any questions, concerns, or complaints.    Cancer Staging  Bladder cancer [facility]) Staging form: Urinary Bladder, AJCC 8th Edition - Clinical stage from 12/07/2022: Stage II (cT2, cN0, cM0) - Signed by Agrawal, Kavita, MD on 01/02/2023 Stage prefix: Initial diagnosis WHO/ISUP grade (low/high): High Grade Histologic grading system: 2 grade system - Pathologic stage from 07/07/2023: Stage IIIB (ypT2a, ypN2, cM0) - Signed by Agrawal, Kavita, MD on 08/31/2023 Stage prefix: Post-therapy Response to neoadjuvant therapy: Partial response WHO/ISUP grade (low/high): High Grade Histologic grading system: 2 grade system   Evalene JINNY Reusing, MD   02/08/2024 2:19 PM

## 2024-02-08 NOTE — Progress Notes (Signed)
 Patient is doing ok, he has started tapering off of the prednisone , he is on the 30 mg for 5 days. He isn't really having any new symptoms.

## 2024-02-09 ENCOUNTER — Other Ambulatory Visit: Payer: Self-pay

## 2024-02-09 NOTE — Progress Notes (Signed)
 Pharmacist Chemotherapy Monitoring - Initial Assessment    Anticipated start date: 02/23/24   The following has been reviewed per standard work regarding the patient's treatment regimen: The patient's diagnosis, treatment plan and drug doses, and organ/hematologic function Lab orders and baseline tests specific to treatment regimen  The treatment plan start date, drug sequencing, and pre-medications Prior authorization status  Patient's documented medication list, including drug-drug interaction screen and prescriptions for anti-emetics and supportive care specific to the treatment regimen The drug concentrations, fluid compatibility, administration routes, and timing of the medications to be used The patient's access for treatment and lifetime cumulative dose history, if applicable  The patient's medication allergies and previous infusion related reactions, if applicable   Changes made to treatment plan:  N/A  Follow up needed:  N/A  Maudie FORBES Andreas, PharmD, BCPS Clinical Pharmacist   02/09/2024  2:08 PM

## 2024-02-11 ENCOUNTER — Other Ambulatory Visit: Payer: Self-pay

## 2024-02-11 LAB — QUANTIFERON-TB GOLD PLUS
QuantiFERON Mitogen Value: 0.04 [IU]/mL
QuantiFERON Nil Value: 0.02 [IU]/mL
QuantiFERON TB1 Ag Value: 0.04 [IU]/mL
QuantiFERON TB2 Ag Value: 0.03 [IU]/mL
QuantiFERON-TB Gold Plus: UNDETERMINED — AB

## 2024-02-13 ENCOUNTER — Encounter: Payer: Self-pay | Admitting: Oncology

## 2024-02-14 ENCOUNTER — Other Ambulatory Visit: Payer: Self-pay | Admitting: Oncology

## 2024-02-19 ENCOUNTER — Encounter: Payer: Self-pay | Admitting: Oncology

## 2024-02-20 NOTE — Telephone Encounter (Signed)
-----   Message from Ryan Dixon sent at 02/14/2024 11:18 AM EDT ----- Pt has had 2 INDETERMINATE Quantiferon Gold / TB tests in a row: 01/29/2024 and 02/07/2024. Pt is being considered for oral Sotyktu (similar to JAK inhibitor) for Psoriasis. It has been Ok'd by his Oncologist.  Please refer to Infectious Disease to advise on results of INDETERMINATE Quantiferon Gold tests. ----- Message ----- From: Rebecka Memos Lab Results In Sent: 02/11/2024   7:35 AM EDT To: Ryan JAYSON Rhyme, MD

## 2024-02-20 NOTE — Addendum Note (Signed)
 Addended by: LAURA CASTELLANI on: 02/20/2024 08:54 AM   Modules accepted: Orders

## 2024-02-20 NOTE — Telephone Encounter (Signed)
 Discussed labs results with patient   Pt has had 2 INDETERMINATE Quantiferon Gold / TB tests in a row: 01/29/2024 and 02/07/2024. Pt is being considered for oral Sotyktu (similar to JAK inhibitor) for Psoriasis. It has been Ok'd by his Oncologist.   Please refer to Infectious Disease to advise on results of INDETERMINATE Quantiferon Gold tests.  Referral sent to infectious disease

## 2024-02-22 ENCOUNTER — Other Ambulatory Visit
Admission: RE | Admit: 2024-02-22 | Discharge: 2024-02-22 | Disposition: A | Discharge status: Home / Self Care | Source: Ambulatory Visit | Attending: Infectious Diseases | Admitting: Infectious Diseases

## 2024-02-22 ENCOUNTER — Encounter: Payer: Self-pay | Admitting: Infectious Diseases

## 2024-02-22 ENCOUNTER — Ambulatory Visit: Attending: Infectious Diseases | Admitting: Infectious Diseases

## 2024-02-22 VITALS — BP 107/66 | HR 87 | Temp 98.1°F | Ht 70.0 in | Wt 149.0 lb

## 2024-02-22 DIAGNOSIS — C7951 Secondary malignant neoplasm of bone: Secondary | ICD-10-CM | POA: Insufficient documentation

## 2024-02-22 DIAGNOSIS — R7612 Nonspecific reaction to cell mediated immunity measurement of gamma interferon antigen response without active tuberculosis: Secondary | ICD-10-CM | POA: Insufficient documentation

## 2024-02-22 DIAGNOSIS — Z9221 Personal history of antineoplastic chemotherapy: Secondary | ICD-10-CM | POA: Diagnosis not present

## 2024-02-22 DIAGNOSIS — Z8551 Personal history of malignant neoplasm of bladder: Secondary | ICD-10-CM | POA: Diagnosis not present

## 2024-02-22 DIAGNOSIS — L409 Psoriasis, unspecified: Secondary | ICD-10-CM | POA: Insufficient documentation

## 2024-02-22 DIAGNOSIS — J449 Chronic obstructive pulmonary disease, unspecified: Secondary | ICD-10-CM | POA: Insufficient documentation

## 2024-02-22 DIAGNOSIS — F17210 Nicotine dependence, cigarettes, uncomplicated: Secondary | ICD-10-CM | POA: Diagnosis not present

## 2024-02-22 DIAGNOSIS — C787 Secondary malignant neoplasm of liver and intrahepatic bile duct: Secondary | ICD-10-CM | POA: Diagnosis not present

## 2024-02-22 DIAGNOSIS — Z227 Latent tuberculosis: Secondary | ICD-10-CM | POA: Insufficient documentation

## 2024-02-22 DIAGNOSIS — C689 Malignant neoplasm of urinary organ, unspecified: Secondary | ICD-10-CM | POA: Insufficient documentation

## 2024-02-22 NOTE — Patient Instructions (Addendum)
 VISIT SUMMARY:  Today, we discussed your recent indeterminate TB test results and your ongoing treatment for bladder cancer, including the new findings of metastasis to your liver and bone. We also reviewed your history of psoriasis and the treatment plan moving forward.  YOUR PLAN:  -INDETERMINATE QUANTIFERON GOLD TESTS: Your TB tests have returned indeterminate results, likely due to your previous steroid use or chemotherapy. Since you need a negative TB test before starting your psoraissis  treatment, we will schedule a TB skin test to confirm the indeterminate results as you are off prednisone  ' we will start you on rifampin 600 mg (two capsules daily) for four months to treat any potential latent TB infection. We will monitor your liver function and other relevant labs during this treatment.,. Please discuss this plan with your oncologist, Dr. Jacobo, to coordinate your care.  -DRUG-INDUCED PSORIASIS: You developed psoriasis after being treated with nivolumab , which has since been stopped. The condition was managed with prednisone , but we cannot continue prednisone  long-term. Your dermatologist plans to start you on Sotyku, a medication for psoriasis. You will also begin pembrolizumab  for your liver and bone metastasis. We will monitor for any psoriasis flare-ups and adjust your treatment as necessary. Please coordinate with Dr. Jacobo and Dr. Hester regarding your psoriasis management and potential drug interactions. We will also monitor for side effects of pembrolizumab , including thyroid  issues, liver problems, and skin conditions.  INSTRUCTIONS:  Please follow up with your oncologist, Dr. Jacobo, to discuss the chemotherapy plan and coordinate your care. We will also schedule a TB skin test to confirm the indeterminate results. Will start rifampin after that  Monitor for any side effects f, and report any issues to your healthcare team immediately.

## 2024-02-22 NOTE — Progress Notes (Signed)
 NAME: RAUL TORRANCE  DOB: 1956-03-18  MRN: [id number]  Date/Time: 02/22/2024 11:26 AM    ?  AMARRI SATTERLY is a 68 year old male with bladder cancer who presents with concerns regarding indeterminate TB test results prior to starting treatment for psoriasis  He has a history of bladder cancer diagnosed a year ago, for which he underwent chemo first followed by cystoprostectomy, pelvic lymph node resection  and ileal conduit on 07/07/23  2 of the nodes were positive and hence he was started on Nivulomab.  He developed a grade three rash after being treated with nivolumab , and it was Discontinued in April . Saw Dermatologist who did biopsy and diagnosed  psoriasis . e The rash primarily affected his arms and legs and was treated with prednisone , which he has since stopped. A repeat imaging done  in Aug 2025 showed extensive hepatic and osseous mets and the plan is to restart him on immune therapy with pembroluzimab, vedotin enfortumab  The dermatologist wants to start him on a JK, TK inhbitor  Deucravacitinib so that he wont get psoriasis flare up with PDL1 inhibitor He has undergone two quant goild , both of which returned indeterminate results.This was done when he was on steroids  He has not traveled outside the country recently except for vacations to the [place], and he has no known exposure to TB. 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 A chest x-ray in July showed no active TB. No fever, chills, weight loss, night sweats  He smokes about five cigarettes a day and has a history of COPD for which he occasionally uses an inhaler. He is not currently taking any medications, including blood pressure or cholesterol medications, which were discontinued by his oncology team. He occasionally takes Aleve for pain relief.   Past Medical History:  Diagnosis Date   Aortic atherosclerosis (HCC)    Chronic foot pain, left    History of hematuria     Hypertension    Other emphysema (HCC)    Other male erectile dysfunction    Pre-diabetes    Pure hypercholesterolemia    Tobacco dependence     Past Surgical History:  Procedure Laterality Date   ANKLE ARTHROSCOPY Left 08/11/2020   Procedure: LEFT ANKLE ARTHROSCOPY AND DEBRIDEMENT;  Surgeon: [name], MD;  Location: [facility];  Service: Orthopedics;  Laterality: Left;   BLADDER INSTILLATION N/A 11/29/2022   Procedure: BLADDER INSTILLATION OF GEMCITABINE ;  Surgeon: [name], MD;  Location: [facility];  Service: Urology;  Laterality: N/A;   COLONOSCOPY     CYSTOSCOPY WITH INJECTION N/A 07/07/2023   Procedure: CYSTOSCOPY WITH INJECTION;  Surgeon: [name], MD;  Location: [facility];  Service: Urology;  Laterality: N/A;   IR IMAGING GUIDED PORT INSERTION  01/20/2023   PELVIC LYMPH NODE DISSECTION N/A 07/07/2023   Procedure: PELVIC LYMPH NODE DISSECTION;  Surgeon: [name], MD;  Location: [facility];  Service: Urology;  Laterality: N/A;   ROBOT ASSISTED LAPAROSCOPIC COMPLETE CYSTECT ILEAL CONDUIT N/A 07/07/2023   Procedure: XI ROBOTIC ASSISTED LAPAROSCOPIC COMPLETE CYSTECT ILEAL CONDUIT, UMBILICAL HERNIA REPAIR, BILATERAL INGUINAL HERNIA REPAIR;  Surgeon: [name], MD;  Location: [facility];  Service: Urology;  Laterality: N/A;   ROBOT ASSISTED LAPAROSCOPIC RADICAL PROSTATECTOMY N/A 07/07/2023   Procedure: XI ROBOTIC ASSISTED LAPAROSCOPIC RADICAL PROSTATECTOMY;  Surgeon: [name], MD;  Location: [facility];  Service: Urology;  Laterality: N/A;   TONSILLECTOMY     TRANSURETHRAL RESECTION OF BLADDER TUMOR N/A 11/29/2022   Procedure: TRANSURETHRAL RESECTION OF BLADDER TUMOR (TURBT);  Surgeon: [name], MD;  Location: [facility];  Service: Urology;  Laterality: N/A;    Social History   Socioeconomic History   Marital status: Married    Spouse name: Tobias   Number of children: Not on file   Years of education: Not on file    Highest education level: Not on file  Occupational History   Not on file  Tobacco Use   Smoking status: Every Day    Current packs/day: 2.00    Average packs/day: 2.0 packs/day for 43.0 years (86.0 ttl pk-yrs)    Types: Cigarettes   Smokeless tobacco: Never  Substance and Sexual Activity   Alcohol use: Yes    Comment: 1-2 beers/day   Drug use: Never   Sexual activity: Not on file  Other Topics Concern   Not on file  Social History Narrative   Not on file   Social Drivers of Health   Financial Resource Strain: Low Risk  (02/08/2023)   Received from [facility]   Overall Financial Resource Strain (CARDIA)    Difficulty of Paying Living Expenses: Not hard at all  Food Insecurity: No Food Insecurity (07/06/2023)   Hunger Vital Sign    Worried About Running Out of Food in the Last Year: Never true    Ran Out of Food in the Last Year: Never true  Transportation Needs: No Transportation Needs (07/06/2023)   PRAPARE - Administrator, Civil Service (Medical): No    Lack of Transportation (Non-Medical): No  Physical Activity: Not on file  Stress: No Stress Concern Present (01/25/2023)   [employer] of Occupational Health - Occupational Stress Questionnaire    Feeling of Stress : Not at all  Social Connections: Moderately Integrated (07/06/2023)   Social Connection and Isolation Panel    Frequency of Communication with Friends and Family: More than three times a week    Frequency of Social Gatherings with Friends and Family: Once a week    Attends Religious Services: More than 4 times per year    Active Member of [employer] or Organizations: No    Attends Banker Meetings: Never    Marital Status: Married  Catering manager Violence: Not At Risk (07/06/2023)   Humiliation, Afraid, Rape, and Kick questionnaire    Fear of Current or Ex-Partner: No    Emotionally Abused: No    Physically Abused: No    Sexually Abused: No    No family history  on file. Allergies  Allergen Reactions   Nsaids Other (See Comments)    GI upset   Other Other (See Comments)    Anti-inflammatories cause GI upset, N/V   I? Current Outpatient Medications  Medication Sig Dispense Refill   albuterol  (VENTOLIN  HFA) 108 (90 Base) MCG/ACT inhaler Inhale 2 puffs into the lungs every 6 (six) hours as needed for wheezing or shortness of breath. 8 g 2   atorvastatin  (LIPITOR) 20 MG tablet TAKE 1 TABLET BY MOUTH EVERY DAY AT NIGHT     Betamethasone  Dipropionate (SERNIVO ) 0.05 % EMUL Apply topically twice daily to itchy rash as needed. Avoid face, groin, axilla 120 mL 4   Carboxymethylcell-Glycerin  PF 0.5-0.9 % SOLN Place 2 drops into both eyes 4 (four) times daily. 1 each 11   lidocaine -prilocaine  (EMLA ) cream Apply to affected area once  30 g 3   mupirocin  ointment (BACTROBAN ) 2 % Apply topically to wound at right thigh twice daily and cover until healed 30 g 1   ondansetron  (ZOFRAN ) 8 MG tablet Take 1 tablet (8 mg total) by mouth every 8 (eight) hours as needed for nausea or vomiting. 60 tablet 1   oxyCODONE  (ROXICODONE ) 5 MG immediate release tablet Take 1 tablet (5 mg total) by mouth every 6 (six) hours as needed for moderate pain (pain score 4-6) or severe pain (pain score 7-10) (post-operatively). 15 tablet 0   prochlorperazine  (COMPAZINE ) 10 MG tablet Take 1 tablet (10 mg total) by mouth every 6 (six) hours as needed for nausea or vomiting. 60 tablet 1   senna-docusate (SENOKOT-S) 8.6-50 MG tablet Take 1 tablet by mouth 2 (two) times daily. While taking strong pain meds to prevent constipation. 10 tablet 0   triamcinolone  ointment (KENALOG ) 0.5 % Apply 1 Application topically 2 (two) times daily. 30 g 0   varenicline  (CHANTIX ) 1 MG tablet Take 1 mg by mouth 2 (two) times daily.     Wheat Dextrin (BENEFIBER) POWD Take 0.5 Scoops by mouth daily.     hydrochlorothiazide (HYDRODIURIL) 25 MG tablet Take 25 mg by mouth daily. (Patient not taking: Reported on  02/22/2024)     No current facility-administered medications for this visit.   Facility-Administered Medications Ordered in Other Visits  Medication Dose Route Frequency Provider Last Rate Last Admin   heparin  lock flush 100 unit/mL  500 Units Intravenous Once Agrawal, Kavita, MD         Abtx:  Anti-infectives (From admission, onward)    None       REVIEW OF SYSTEMS:  Const: negative fever, negative chills, negative weight loss Eyes: negative diplopia or visual changes, negative eye pain ENT: negative coryza, negative sore throat Resp: negative cough, hemoptysis, dyspnea Cards: negative for chest pain, palpitations, lower extremity edema GU: negative for frequency, dysuria and hematuria GI: Negative for abdominal pain, diarrhea, bleeding, constipation Skin:as above Heme: negative for easy bruising and gum/nose bleeding MS: negative for myalgias, arthralgias, back pain and muscle weakness Neurolo:negative for headaches, dizziness, vertigo, memory problems  Psych: negative for feelings of anxiety, depression  Endocrine: negative for thyroid , diabetes Allergy/Immunology- NSAID Objective:  VITALS:  BP 107/66   Pulse 87   Temp 98.1 F (36.7 C) (Temporal)   Ht 5' 10 (1.778 m)   Wt 149 lb (67.6 kg)   SpO2 96%   BMI 21.38 kg/m   PHYSICAL EXAM:  General: Alert, cooperative, no distress, appears stated age.  Head: Normocephalic, without obvious abnormality, atraumatic. Eyes: Conjunctivae clear, anicteric sclerae. Pupils are equal ENT Nares normal. No drainage or sinus tenderness. Lips, mucosa, and tongue normal. No Thrush Neck: Supple, symmetrical, no adenopathy, thyroid : non tender no carotid bruit and no JVD. Back: No CVA tenderness. Lungs: Clear to auscultation bilaterally. No Wheezing or Rhonchi. No rales. Heart: Regular rate and rhythm, no murmur, rub or gallop. Abdomen: Soft, non-tender,not distended. Bowel sounds normal. No masses Extremities: atraumatic, no  cyanosis. No edema. No clubbing Skin: No rashes or lesions. Or bruising Lymph: Cervical, supraclavicular normal. Neurologic: Grossly non-focal Pertinent Labs Lab Results CBC    Component Value Date/Time   WBC 13.4 (H) 01/26/2024 1302   WBC 11.8 (H) 12/28/2023 0111   RBC 3.69 (L) 01/26/2024 1302   HGB 11.5 (L) 01/26/2024 1302   HCT 34.6 (L) 01/26/2024 1302   PLT 350 01/26/2024 1302   MCV 93.8 01/26/2024 1302  MCH 31.2 01/26/2024 1302   MCHC 33.2 01/26/2024 1302   RDW 14.1 01/26/2024 1302   LYMPHSABS 0.8 01/26/2024 1302   MONOABS 0.5 01/26/2024 1302   EOSABS 0.0 01/26/2024 1302   BASOSABS 0.0 01/26/2024 1302       Latest Ref Rng & Units 01/26/2024    1:02 PM 12/28/2023    1:11 AM 10/26/2023    8:53 AM  CMP  Glucose 70 - 99 mg/dL 811  886  880   BUN 8 - 23 mg/dL 37  37  33   Creatinine 0.61 - 1.24 mg/dL 8.47  7.91  8.47   Sodium 135 - 145 mmol/L 136  140  135   Potassium 3.5 - 5.1 mmol/L 4.5  4.1  5.0   Chloride 98 - 111 mmol/L 104  107  103   CO2 22 - 32 mmol/L 23  24  25    Calcium  8.9 - 10.3 mg/dL 8.5  8.7  8.6   Total Protein 6.5 - 8.1 g/dL 6.8   6.8   Total Bilirubin 0.0 - 1.2 mg/dL 0.6   0.4   Alkaline Phos 38 - 126 U/L 691   69   AST 15 - 41 U/L 27   24   ALT 0 - 44 U/L 22   32       Microbiology: No results found for this or any previous visit (from the past 240 hours).  Lines and Device Date on insertion # of days DC  Central line     Foley     ETT       IMAGING RESULTS: I have personally reviewed the films ? Impression/Recommendation ?Indeterminate Quantiferon Gold tests   Two indeterminate Quantiferon Gold tests likely result from prior steroid use or chemotherapy affecting immune response. He shows no active TB symptoms or known exposure, with a history of travel to the [place] two years ago without known TB contact. A chest X-ray from July showed no active TB.  The quant god was done in order to start Deucravacitinib for psoraisis  This is a  difficult situation The Treatment for latent TB rifampin/Inh has the potential to cause skin rash and the former can cause psoriasiform rash  So need to weigh the risk and benefit of Rx  I plan to do a repeat Quant gold ,now that he is off steroids Will also do PPD if needed  I discussed rifampin   600 mg, two capsules daily for four months to treat potential latent TB infection. Monitor for drug-drug interactions, especially with rifampin, and observe liver function and other relevant labs during treatment with patient . Consider alternative medication if rifampin causes adverse effects. 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 The condition resolved with prednisone , but prednisone  cannot be continued. The dermatologist plans to start Sotyku, a Janus kinase inhibitor, for psoriasis management.  Scheduled to start pembrolizumab  for liver and bone metastasis treatment. Start pembrolizumab  treatment as scheduled and initiate . Monitor for any psoriasis flare-ups and adjust treatment as necessary. Coordinate with Dr. Jacobo and Dr. Hester regarding the management of psoriasis and potential drug interactions. Monitor for side effects of pembrolizumab , including thyroid  dysfunction, hepatitis, and dermatological conditions. Provide information on rifampin and its potential side effects. Consider alternative TB treatment options if rifampin causes adverse reactions. ?  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    ________________________________________________ Discussed with patient,in detail Follow up with tests

## 2024-02-23 ENCOUNTER — Encounter: Payer: Self-pay | Admitting: Oncology

## 2024-02-23 ENCOUNTER — Inpatient Hospital Stay: Attending: Internal Medicine

## 2024-02-23 ENCOUNTER — Inpatient Hospital Stay (HOSPITAL_BASED_OUTPATIENT_CLINIC_OR_DEPARTMENT_OTHER): Admitting: Oncology

## 2024-02-23 ENCOUNTER — Inpatient Hospital Stay

## 2024-02-23 VITALS — BP 110/65 | HR 81 | Temp 96.0°F | Resp 19

## 2024-02-23 VITALS — BP 112/67 | HR 84 | Temp 97.4°F | Resp 16 | Wt 147.5 lb

## 2024-02-23 DIAGNOSIS — C787 Secondary malignant neoplasm of liver and intrahepatic bile duct: Secondary | ICD-10-CM | POA: Diagnosis not present

## 2024-02-23 DIAGNOSIS — Z79620 Long term (current) use of immunosuppressive biologic: Secondary | ICD-10-CM | POA: Diagnosis not present

## 2024-02-23 DIAGNOSIS — C679 Malignant neoplasm of bladder, unspecified: Secondary | ICD-10-CM | POA: Insufficient documentation

## 2024-02-23 DIAGNOSIS — C7951 Secondary malignant neoplasm of bone: Secondary | ICD-10-CM | POA: Insufficient documentation

## 2024-02-23 DIAGNOSIS — F17210 Nicotine dependence, cigarettes, uncomplicated: Secondary | ICD-10-CM | POA: Insufficient documentation

## 2024-02-23 DIAGNOSIS — D649 Anemia, unspecified: Secondary | ICD-10-CM | POA: Diagnosis present

## 2024-02-23 DIAGNOSIS — Z5112 Encounter for antineoplastic immunotherapy: Secondary | ICD-10-CM | POA: Insufficient documentation

## 2024-02-23 LAB — CMP (CANCER CENTER ONLY)
ALT: 61 U/L — ABNORMAL HIGH (ref 0–44)
AST: 70 U/L — ABNORMAL HIGH (ref 15–41)
Albumin: 3.2 g/dL — ABNORMAL LOW (ref 3.5–5.0)
Alkaline Phosphatase: 2506 U/L — ABNORMAL HIGH (ref 38–126)
Anion gap: 7 (ref 5–15)
BUN: 30 mg/dL — ABNORMAL HIGH (ref 8–23)
CO2: 18 mmol/L — ABNORMAL LOW (ref 22–32)
Calcium: 8.4 mg/dL — ABNORMAL LOW (ref 8.9–10.3)
Chloride: 111 mmol/L (ref 98–111)
Creatinine: 2.02 mg/dL — ABNORMAL HIGH (ref 0.61–1.24)
GFR, Estimated: 36 mL/min — ABNORMAL LOW (ref >60)
Glucose, Bld: 120 mg/dL — ABNORMAL HIGH (ref 70–99)
Potassium: 4.5 mmol/L (ref 3.5–5.1)
Sodium: 136 mmol/L (ref 135–145)
Total Bilirubin: 1 mg/dL (ref 0.0–1.2)
Total Protein: 6.3 g/dL — ABNORMAL LOW (ref 6.5–8.1)

## 2024-02-23 LAB — CBC WITH DIFFERENTIAL (CANCER CENTER ONLY)
Abs Immature Granulocytes: 0.65 K/uL — ABNORMAL HIGH (ref 0.00–0.07)
Basophils Absolute: 0 K/uL (ref 0.0–0.1)
Basophils Relative: 0 %
Eosinophils Absolute: 0.2 K/uL (ref 0.0–0.5)
Eosinophils Relative: 3 %
HCT: 29 % — ABNORMAL LOW (ref 39.0–52.0)
Hemoglobin: 9.6 g/dL — ABNORMAL LOW (ref 13.0–17.0)
Immature Granulocytes: 9 %
Lymphocytes Relative: 17 %
Lymphs Abs: 1.2 K/uL (ref 0.7–4.0)
MCH: 30.7 pg (ref 26.0–34.0)
MCHC: 33.1 g/dL (ref 30.0–36.0)
MCV: 92.7 fL (ref 80.0–100.0)
Monocytes Absolute: 0.5 K/uL (ref 0.1–1.0)
Monocytes Relative: 7 %
Neutro Abs: 4.4 K/uL (ref 1.7–7.7)
Neutrophils Relative %: 64 %
Platelet Count: 69 K/uL — ABNORMAL LOW (ref 150–400)
RBC: 3.13 MIL/uL — ABNORMAL LOW (ref 4.22–5.81)
RDW: 15 % (ref 11.5–15.5)
WBC Count: 7 K/uL (ref 4.0–10.5)
nRBC: 2 % — ABNORMAL HIGH (ref 0.0–0.2)

## 2024-02-23 LAB — TSH: TSH: 3.122 u[IU]/mL (ref 0.350–4.500)

## 2024-02-23 MED ORDER — PROCHLORPERAZINE MALEATE 10 MG PO TABS
10.0000 mg | ORAL_TABLET | Freq: Once | ORAL | Status: AC
  Administered 2024-02-23: 10 mg via ORAL
  Filled 2024-02-23: qty 1

## 2024-02-23 MED ORDER — SODIUM CHLORIDE 0.9 % IV SOLN
1.2500 mg/kg | Freq: Once | INTRAVENOUS | Status: AC
  Administered 2024-02-23: 90 mg via INTRAVENOUS
  Filled 2024-02-23: qty 9

## 2024-02-23 MED ORDER — SODIUM CHLORIDE 0.9 % IV SOLN
200.0000 mg | Freq: Once | INTRAVENOUS | Status: AC
  Administered 2024-02-23: 200 mg via INTRAVENOUS
  Filled 2024-02-23: qty 200

## 2024-02-23 MED ORDER — SODIUM CHLORIDE 0.9 % IV SOLN
INTRAVENOUS | Status: DC
  Filled 2024-02-23: qty 250

## 2024-02-23 NOTE — Progress Notes (Signed)
 [facility]  Telephone:(336) [phone] Fax:(336) [phone]  ID: Ryan Dixon OB: 1956-04-02  MR#: [id number]  RDW#:[id number]  Patient Care Team: [name], MD as PCP - General (Family Medicine) [name], MD as Consulting Physician (Oncology)  CHIEF COMPLAINT: Stage IV high-grade urothelial carcinoma.  INTERVAL HISTORY: Patient returns to clinic today for further evaluation and consideration of cycle 1, day 1 of Enfortumab and pembrolizumab .  He has now completed his steroid taper.  He currently feels well and is asymptomatic. He has no neurologic complaints.  He denies any recent fevers or illnesses.  He has a good appetite and denies weight loss.  He has no chest pain, shortness of breath, cough, or hemoptysis.  He denies any abdominal pain.  He denies any nausea, vomiting, constipation, or diarrhea.  He has no urinary complaints.  Patient offers no specific complaints today.  REVIEW OF SYSTEMS:   Review of Systems  Constitutional: Negative.  Negative for fever, malaise/fatigue and weight loss.  Respiratory: Negative.  Negative for cough, hemoptysis and shortness of breath.   Cardiovascular: Negative.  Negative for chest pain and leg swelling.  Gastrointestinal: Negative.  Negative for abdominal pain.  Genitourinary: Negative.  Negative for dysuria.  Musculoskeletal: Negative.  Negative for back pain.  Skin: Negative.  Negative for itching and rash.  Neurological: Negative.  Negative for dizziness, focal weakness, weakness and headaches.  Psychiatric/Behavioral: Negative.  The patient is not nervous/anxious.     As per HPI. Otherwise, a complete review of systems is negative.  PAST MEDICAL HISTORY: Past Medical History:  Diagnosis Date   Aortic atherosclerosis (HCC)    Chronic foot pain, left    History of hematuria    Hypertension    Other emphysema (HCC)    Other male erectile dysfunction    Pre-diabetes    Pure hypercholesterolemia     Tobacco dependence     PAST SURGICAL HISTORY: Past Surgical History:  Procedure Laterality Date   ANKLE ARTHROSCOPY Left 08/11/2020   Procedure: LEFT ANKLE ARTHROSCOPY AND DEBRIDEMENT;  Surgeon: [name], MD;  Location: [facility];  Service: Orthopedics;  Laterality: Left;   BLADDER INSTILLATION N/A 11/29/2022   Procedure: BLADDER INSTILLATION OF GEMCITABINE ;  Surgeon: [name], MD;  Location: [facility];  Service: Urology;  Laterality: N/A;   COLONOSCOPY     CYSTOSCOPY WITH INJECTION N/A 07/07/2023   Procedure: CYSTOSCOPY WITH INJECTION;  Surgeon: [name], MD;  Location: [facility];  Service: Urology;  Laterality: N/A;   IR IMAGING GUIDED PORT INSERTION  01/20/2023   PELVIC LYMPH NODE DISSECTION N/A 07/07/2023   Procedure: PELVIC LYMPH NODE DISSECTION;  Surgeon: [name], MD;  Location: [facility];  Service: Urology;  Laterality: N/A;   ROBOT ASSISTED LAPAROSCOPIC COMPLETE CYSTECT ILEAL CONDUIT N/A 07/07/2023   Procedure: XI ROBOTIC ASSISTED LAPAROSCOPIC COMPLETE CYSTECT ILEAL CONDUIT, UMBILICAL HERNIA REPAIR, BILATERAL INGUINAL HERNIA REPAIR;  Surgeon: [name], MD;  Location: [facility];  Service: Urology;  Laterality: N/A;   ROBOT ASSISTED LAPAROSCOPIC RADICAL PROSTATECTOMY N/A 07/07/2023   Procedure: XI ROBOTIC ASSISTED LAPAROSCOPIC RADICAL PROSTATECTOMY;  Surgeon: [name], MD;  Location: [facility];  Service: Urology;  Laterality: N/A;   TONSILLECTOMY     TRANSURETHRAL RESECTION OF BLADDER TUMOR N/A 11/29/2022   Procedure: TRANSURETHRAL RESECTION OF BLADDER TUMOR (TURBT);  Surgeon: [name], MD;  Location: [facility];  Service: Urology;  Laterality: N/A;    FAMILY HISTORY: History  reviewed. No pertinent family history.  ADVANCED DIRECTIVES (Y/N):  N  HEALTH MAINTENANCE: Social History   Tobacco Use   Smoking status: Every Day    Current packs/day: 2.00    Average packs/day: 2.0 packs/day for 43.0 years (86.0 ttl  pk-yrs)    Types: Cigarettes   Smokeless tobacco: Never  Substance Use Topics   Alcohol use: Yes    Comment: 1-2 beers/day   Drug use: Never     Colonoscopy:  PAP:  Bone density:  Lipid panel:  Allergies  Allergen Reactions   Nsaids Other (See Comments)    GI upset   Other Other (See Comments)    Anti-inflammatories cause GI upset, N/V    Current Outpatient Medications  Medication Sig Dispense Refill   albuterol  (VENTOLIN  HFA) 108 (90 Base) MCG/ACT inhaler Inhale 2 puffs into the lungs every 6 (six) hours as needed for wheezing or shortness of breath. 8 g 2   atorvastatin  (LIPITOR) 20 MG tablet TAKE 1 TABLET BY MOUTH EVERY DAY AT NIGHT     Betamethasone  Dipropionate (SERNIVO ) 0.05 % EMUL Apply topically twice daily to itchy rash as needed. Avoid face, groin, axilla 120 mL 4   Carboxymethylcell-Glycerin  PF 0.5-0.9 % SOLN Place 2 drops into both eyes 4 (four) times daily. 1 each 11   lidocaine -prilocaine  (EMLA ) cream Apply to affected area once 30 g 3   mupirocin  ointment (BACTROBAN ) 2 % Apply topically to wound at right thigh twice daily and cover until healed 30 g 1   ondansetron  (ZOFRAN ) 8 MG tablet Take 1 tablet (8 mg total) by mouth every 8 (eight) hours as needed for nausea or vomiting. 60 tablet 1   oxyCODONE  (ROXICODONE ) 5 MG immediate release tablet Take 1 tablet (5 mg total) by mouth every 6 (six) hours as needed for moderate pain (pain score 4-6) or severe pain (pain score 7-10) (post-operatively). 15 tablet 0   prochlorperazine  (COMPAZINE ) 10 MG tablet Take 1 tablet (10 mg total) by mouth every 6 (six) hours as needed for nausea or vomiting. 60 tablet 1   senna-docusate (SENOKOT-S) 8.6-50 MG tablet Take 1 tablet by mouth 2 (two) times daily. While taking strong pain meds to prevent constipation. 10 tablet 0   triamcinolone  ointment (KENALOG ) 0.5 % Apply 1 Application topically 2 (two) times daily. 30 g 0   varenicline  (CHANTIX ) 1 MG tablet Take 1 mg by mouth 2 (two) times  daily.     Wheat Dextrin (BENEFIBER) POWD Take 0.5 Scoops by mouth daily.     hydrochlorothiazide (HYDRODIURIL) 25 MG tablet Take 25 mg by mouth daily. (Patient not taking: Reported on 02/23/2024)     No current facility-administered medications for this visit.   Facility-Administered Medications Ordered in Other Visits  Medication Dose Route Frequency Provider Last Rate Last Admin   heparin  lock flush 100 unit/mL  500 Units Intravenous Once Agrawal, Kavita, MD        OBJECTIVE: Vitals:   02/23/24 0931  BP: 112/67  Pulse: 84  Resp: 16  Temp: (!) 97.4 F (36.3 C)  SpO2: 98%      Body mass index is 21.16 kg/m.    ECOG FS:0 - Asymptomatic  General: Well-developed, well-nourished, no acute distress. Eyes: Pink conjunctiva, anicteric sclera. HEENT: Normocephalic, moist mucous membranes. Lungs: No audible wheezing or coughing. Heart: Regular rate and rhythm. Abdomen: Soft, nontender, no obvious distention. Musculoskeletal: No edema, cyanosis, or clubbing. Neuro: Alert, answering all questions appropriately. Cranial nerves grossly intact. Skin: No rashes or petechiae  noted. Psych: Normal affect.  LAB RESULTS:  Lab Results  Component Value Date   NA 136 01/26/2024   K 4.5 01/26/2024   CL 104 01/26/2024   CO2 23 01/26/2024   GLUCOSE 188 (H) 01/26/2024   BUN 37 (H) 01/26/2024   CREATININE 1.52 (H) 01/26/2024   CALCIUM  8.5 (L) 01/26/2024   PROT 6.8 01/26/2024   ALBUMIN  3.5 01/26/2024   AST 27 01/26/2024   ALT 22 01/26/2024   ALKPHOS 691 (H) 01/26/2024   BILITOT 0.6 01/26/2024   GFRNONAA 50 (L) 01/26/2024    Lab Results  Component Value Date   WBC 13.4 (H) 01/26/2024   NEUTROABS 11.6 (H) 01/26/2024   HGB 11.5 (L) 01/26/2024   HCT 34.6 (L) 01/26/2024   MCV 93.8 01/26/2024   PLT 350 01/26/2024     STUDIES: CT CHEST ABDOMEN PELVIS W CONTRAST Result Date: 02/01/2024 EXAM:  CT CHEST ABDOMEN PELVIS WITH IV CONTRAST INDICATION:  Malignant neoplasm of urinary bladder,  unspecified site TECHNIQUE: Spiral CT scanning was performed through the chest, abdomen and pelvis after the patient received intravenous contrast. COMPARISON: 10/23/2023 FINDINGS: The cardiac size is within normal limits. There is no thoracic aortic aneurysm. No filling defects are identified in the central pulmonary arteries. The thyroid  gland and esophagus are within normal limits. No mass or adenopathy is identified in the chest. There are no pleural or pericardial effusions. A right-sided chest wall port is present. There is been no change in the upper lobe predominant centrilobular emphysema. No suspicious pulmonary mass has developed. Innumerable low-attenuation hepatic masses have developed measuring up to 2.2 cm. There is no hepatic or portal venous thrombosis. No biliary ductal dilatation is present. There is no significant abnormality identified in the spleen, pancreas and gallbladder. No renal calculus, obstruction, or soft tissue mass is present. There are no adrenal masses. The ileal conduit and right-sided urostomy are unchanged. There is no bowel dilatation. No adenopathy is identified. No abdominal aortic aneurysm is present. The patient is status post cystoprostatectomy. There is no recurrent mass. There is mild free fluid in the dependent portion of the pelvis, increased from the previous examination. Sigmoid diverticulosis is present with no associated inflammation. Numerous small areas of sclerosis have developed throughout the visualized bones. IMPRESSION: 1. Interval development of diffuse hepatic metastases. 2. Interval development of diffuse osseous metastases. 3. Nonspecific free pelvic fluid, increased from previous examination. 4. No local tumor recurrence. Please note that CT scanning at this site utilizes multiple dose reduction techniques, including automatic exposure control, adjustment of the MAA and/or KVP according to the patient's size, and use of iterative reconstruction.  Electronically signed by: Eddy Oar MD 02/01/2024 12:25 PM EDT RP Workstation: [id number]    ASSESSMENT: Stage IV high-grade urothelial carcinoma.  PLAN:    Stage IV high-grade urothelial carcinoma: Patient was initially stage IIIb and received neoadjuvant chemotherapy with dose dense MVAC completing treatment on April 05, 2023.  He subsequently underwent cystoprostatectomy on July 07, 2023.  Patient was noted to have residual disease and 2 of 14 lymph nodes positive for malignancy.  He started on adjuvant nivolumab  with plans to do 1 year of maintenance treatment, but developed grade 3 rash.  Per dermatology, rash was related to an exacerbation of psoriasis and not a reaction to his immunotherapy.  Rash resolved with topical treatment and systemic steroids.  Despite this, patient elected to not to reinitiate maintenance treatment.  Imaging on Oct 23, 2023 with no obvious evidence of recurrent or progressive disease.  Repeat imaging on January 26, 2024 revealed new widespread metastatic disease involving liver and bone.  Case discussed with dermatology and current plan is to proceed with treatment using pembrolizumab  and enfortumab vedotin  on day 1 with Enfortumab only on day 8.  This will be a 21-day cycle.  Despite thrombocytopenia and mild transaminitis, we will proceed with cycle 1, day 1 of treatment today.  Return to clinic in 1 week for further evaluation and consideration of cycle 1, day 8.   Psoriasis exacerbation: Case discussed with dermatology.  Pending final infectious workup for hepatitis B, C, and TB, okay from an oncology standpoint to initiate Sotyku.  Patient has completed his steroid taper.   Inconclusive TB results: Continue follow-up with per ID.   Renal insufficiency: Patient developed platinum induced nephrotoxicity after the completion of cycle 4 of his dose dense MVAC.  Creatinine has trended up to 2.02.  Neither Enfortumab or pembrolizumab  need to be dosed reduced in renal  insufficiency.   Transaminitis: Possibly related to progressive malignancy.  Either in 4 to Mab or pembrolizumab  need to be dose reduced with hepatic insufficiency with a normal bilirubin.  Proceed with treatment as above.   Thrombocytopenia: Patient's platelet count has decreased to 69.  Proceed cautiously with treatment as above.   Anemia: Hemoglobin has trended down to 9.6, monitor. Leukocytosis: Resolved.   Patient expressed understanding and was in agreement with this plan. He also understands that He can call clinic at any time with any questions, concerns, or complaints.    Cancer Staging  Bladder cancer [facility]) Staging form: Urinary Bladder, AJCC 8th Edition - Clinical stage from 12/07/2022: Stage II (cT2, cN0, cM0) - Signed by Agrawal, Kavita, MD on 01/02/2023 Stage prefix: Initial diagnosis WHO/ISUP grade (low/high): High Grade Histologic grading system: 2 grade system - Pathologic stage from 07/07/2023: Stage IIIB (ypT2a, ypN2, cM0) - Signed by Agrawal, Kavita, MD on 08/31/2023 Stage prefix: Post-therapy Response to neoadjuvant therapy: Partial response WHO/ISUP grade (low/high): High Grade Histologic grading system: 2 grade system   Evalene JINNY Reusing, MD   02/23/2024 9:53 AM

## 2024-02-23 NOTE — Patient Instructions (Signed)

## 2024-02-25 LAB — QUANTIFERON-TB GOLD PLUS: QuantiFERON-TB Gold Plus: NEGATIVE

## 2024-02-25 LAB — QUANTIFERON-TB GOLD PLUS (RQFGPL)
QuantiFERON Mitogen Value: 4.14 [IU]/mL
QuantiFERON Nil Value: 0.02 [IU]/mL
QuantiFERON TB1 Ag Value: 0.03 [IU]/mL
QuantiFERON TB2 Ag Value: 0.02 [IU]/mL

## 2024-02-26 LAB — T4: T4, Total: 5.2 ug/dL (ref 4.5–12.0)

## 2024-02-27 ENCOUNTER — Ambulatory Visit: Payer: Self-pay

## 2024-02-28 ENCOUNTER — Other Ambulatory Visit: Payer: Self-pay

## 2024-03-01 ENCOUNTER — Inpatient Hospital Stay: Admitting: Oncology

## 2024-03-01 ENCOUNTER — Encounter: Payer: Self-pay | Admitting: Oncology

## 2024-03-01 ENCOUNTER — Inpatient Hospital Stay

## 2024-03-01 VITALS — BP 99/57 | HR 92 | Temp 97.0°F | Resp 16 | Wt 141.6 lb

## 2024-03-01 DIAGNOSIS — C679 Malignant neoplasm of bladder, unspecified: Secondary | ICD-10-CM

## 2024-03-01 DIAGNOSIS — Z5112 Encounter for antineoplastic immunotherapy: Secondary | ICD-10-CM | POA: Diagnosis not present

## 2024-03-01 LAB — CMP (CANCER CENTER ONLY)
ALT: 42 U/L (ref 0–44)
AST: 62 U/L — ABNORMAL HIGH (ref 15–41)
Albumin: 2.9 g/dL — ABNORMAL LOW (ref 3.5–5.0)
Alkaline Phosphatase: 2244 U/L — ABNORMAL HIGH (ref 38–126)
Anion gap: 9 (ref 5–15)
BUN: 48 mg/dL — ABNORMAL HIGH (ref 8–23)
CO2: 16 mmol/L — ABNORMAL LOW (ref 22–32)
Calcium: 7.9 mg/dL — ABNORMAL LOW (ref 8.9–10.3)
Chloride: 109 mmol/L (ref 98–111)
Creatinine: 2.45 mg/dL — ABNORMAL HIGH (ref 0.61–1.24)
GFR, Estimated: 28 mL/min — ABNORMAL LOW (ref >60)
Glucose, Bld: 118 mg/dL — ABNORMAL HIGH (ref 70–99)
Potassium: 4.6 mmol/L (ref 3.5–5.1)
Sodium: 134 mmol/L — ABNORMAL LOW (ref 135–145)
Total Bilirubin: 1.1 mg/dL (ref 0.0–1.2)
Total Protein: 6.1 g/dL — ABNORMAL LOW (ref 6.5–8.1)

## 2024-03-01 LAB — CBC WITH DIFFERENTIAL (CANCER CENTER ONLY)
Abs Immature Granulocytes: 0.36 K/uL — ABNORMAL HIGH (ref 0.00–0.07)
Basophils Absolute: 0 K/uL (ref 0.0–0.1)
Basophils Relative: 1 %
Eosinophils Absolute: 0.2 K/uL (ref 0.0–0.5)
Eosinophils Relative: 4 %
HCT: 23.8 % — ABNORMAL LOW (ref 39.0–52.0)
Hemoglobin: 8.1 g/dL — ABNORMAL LOW (ref 13.0–17.0)
Immature Granulocytes: 8 %
Lymphocytes Relative: 26 %
Lymphs Abs: 1.1 K/uL (ref 0.7–4.0)
MCH: 31.2 pg (ref 26.0–34.0)
MCHC: 34 g/dL (ref 30.0–36.0)
MCV: 91.5 fL (ref 80.0–100.0)
Monocytes Absolute: 0.5 K/uL (ref 0.1–1.0)
Monocytes Relative: 11 %
Neutro Abs: 2.2 K/uL (ref 1.7–7.7)
Neutrophils Relative %: 50 %
Platelet Count: 25 K/uL — ABNORMAL LOW (ref 150–400)
RBC: 2.6 MIL/uL — ABNORMAL LOW (ref 4.22–5.81)
RDW: 15.1 % (ref 11.5–15.5)
Smear Review: NORMAL
WBC Count: 4.4 K/uL (ref 4.0–10.5)
nRBC: 7 % — ABNORMAL HIGH (ref 0.0–0.2)

## 2024-03-01 MED ORDER — SODIUM CHLORIDE 0.9 % IV SOLN
Freq: Once | INTRAVENOUS | Status: AC
  Filled 2024-03-01: qty 250

## 2024-03-01 MED ORDER — DEXAMETHASONE SODIUM PHOSPHATE 10 MG/ML IJ SOLN
10.0000 mg | Freq: Once | INTRAMUSCULAR | Status: AC
  Administered 2024-03-01: 10 mg via INTRAVENOUS
  Filled 2024-03-01: qty 1

## 2024-03-01 NOTE — Progress Notes (Signed)
 [facility]  Telephone:(336) [phone] Fax:(336) [phone]  ID: Ryan Dixon OB: 14-Aug-1955  MR#: [id number]  RDW#:[id number]  Patient Care Team: [name], MD as PCP - General (Family Medicine) [name], MD as Consulting Physician (Oncology)  CHIEF COMPLAINT: Stage IV high-grade urothelial carcinoma.  INTERVAL HISTORY: Patient returns to clinic today for further evaluation and consideration of cycle 1, day 8 of Enfortumab and pembrolizumab .  Enfortumab only today.  Patient reports increased weakness and fatigue, poor appetite, and occasional nosebleed.  He has no neurologic complaints.  He denies any recent fevers or illnesses.  He has no chest pain, shortness of breath, cough, or hemoptysis.  He denies any abdominal pain.  He denies any nausea, vomiting, constipation, or diarrhea.  He has no urinary complaints.  Patient offers no further specific complaints today.  REVIEW OF SYSTEMS:   Review of Systems  Constitutional:  Positive for malaise/fatigue and weight loss. Negative for fever.  HENT:  Positive for nosebleeds.   Respiratory: Negative.  Negative for cough, hemoptysis and shortness of breath.   Cardiovascular: Negative.  Negative for chest pain and leg swelling.  Gastrointestinal: Negative.  Negative for abdominal pain.  Genitourinary: Negative.  Negative for dysuria.  Musculoskeletal: Negative.  Negative for back pain.  Skin: Negative.  Negative for itching and rash.  Neurological:  Positive for weakness. Negative for dizziness, focal weakness and headaches.  Psychiatric/Behavioral: Negative.  The patient is not nervous/anxious.     As per HPI. Otherwise, a complete review of systems is negative.  PAST MEDICAL HISTORY: Past Medical History:  Diagnosis Date   Aortic atherosclerosis (HCC)    Chronic foot pain, left    History of hematuria    Hypertension    Other emphysema (HCC)    Other male erectile dysfunction    Pre-diabetes     Pure hypercholesterolemia    Tobacco dependence     PAST SURGICAL HISTORY: Past Surgical History:  Procedure Laterality Date   ANKLE ARTHROSCOPY Left 08/11/2020   Procedure: LEFT ANKLE ARTHROSCOPY AND DEBRIDEMENT;  Surgeon: [name], MD;  Location: [facility];  Service: Orthopedics;  Laterality: Left;   BLADDER INSTILLATION N/A 11/29/2022   Procedure: BLADDER INSTILLATION OF GEMCITABINE ;  Surgeon: [name], MD;  Location: [facility];  Service: Urology;  Laterality: N/A;   COLONOSCOPY     CYSTOSCOPY WITH INJECTION N/A 07/07/2023   Procedure: CYSTOSCOPY WITH INJECTION;  Surgeon: [name], MD;  Location: [facility];  Service: Urology;  Laterality: N/A;   IR IMAGING GUIDED PORT INSERTION  01/20/2023   PELVIC LYMPH NODE DISSECTION N/A 07/07/2023   Procedure: PELVIC LYMPH NODE DISSECTION;  Surgeon: [name], MD;  Location: [facility];  Service: Urology;  Laterality: N/A;   ROBOT ASSISTED LAPAROSCOPIC COMPLETE CYSTECT ILEAL CONDUIT N/A 07/07/2023   Procedure: XI ROBOTIC ASSISTED LAPAROSCOPIC COMPLETE CYSTECT ILEAL CONDUIT, UMBILICAL HERNIA REPAIR, BILATERAL INGUINAL HERNIA REPAIR;  Surgeon: [name], MD;  Location: [facility];  Service: Urology;  Laterality: N/A;   ROBOT ASSISTED LAPAROSCOPIC RADICAL PROSTATECTOMY N/A 07/07/2023   Procedure: XI ROBOTIC ASSISTED LAPAROSCOPIC RADICAL PROSTATECTOMY;  Surgeon: [name], MD;  Location: [facility];  Service: Urology;  Laterality: N/A;   TONSILLECTOMY     TRANSURETHRAL RESECTION OF BLADDER TUMOR N/A 11/29/2022   Procedure: TRANSURETHRAL RESECTION OF BLADDER TUMOR (TURBT);  Surgeon: [name], MD;  Location: [facility];  Service: Urology;  Laterality: N/A;    FAMILY HISTORY:  History reviewed. No pertinent family history.  ADVANCED DIRECTIVES (Y/N):  N  HEALTH MAINTENANCE: Social History   Tobacco Use   Smoking status: Every Day    Current packs/day: 2.00    Average packs/day: 2.0  packs/day for 43.0 years (86.0 ttl pk-yrs)    Types: Cigarettes   Smokeless tobacco: Never  Substance Use Topics   Alcohol use: Yes    Comment: 1-2 beers/day   Drug use: Never     Colonoscopy:  PAP:  Bone density:  Lipid panel:  Allergies  Allergen Reactions   Nsaids Other (See Comments)    GI upset   Other Other (See Comments)    Anti-inflammatories cause GI upset, N/V    Current Outpatient Medications  Medication Sig Dispense Refill   albuterol  (VENTOLIN  HFA) 108 (90 Base) MCG/ACT inhaler Inhale 2 puffs into the lungs every 6 (six) hours as needed for wheezing or shortness of breath. 8 g 2   atorvastatin  (LIPITOR) 20 MG tablet TAKE 1 TABLET BY MOUTH EVERY DAY AT NIGHT     Betamethasone  Dipropionate (SERNIVO ) 0.05 % EMUL Apply topically twice daily to itchy rash as needed. Avoid face, groin, axilla 120 mL 4   Carboxymethylcell-Glycerin  PF 0.5-0.9 % SOLN Place 2 drops into both eyes 4 (four) times daily. 1 each 11   lidocaine -prilocaine  (EMLA ) cream Apply to affected area once 30 g 3   mupirocin  ointment (BACTROBAN ) 2 % Apply topically to wound at right thigh twice daily and cover until healed 30 g 1   ondansetron  (ZOFRAN ) 8 MG tablet Take 1 tablet (8 mg total) by mouth every 8 (eight) hours as needed for nausea or vomiting. 60 tablet 1   oxyCODONE  (ROXICODONE ) 5 MG immediate release tablet Take 1 tablet (5 mg total) by mouth every 6 (six) hours as needed for moderate pain (pain score 4-6) or severe pain (pain score 7-10) (post-operatively). 15 tablet 0   prochlorperazine  (COMPAZINE ) 10 MG tablet Take 1 tablet (10 mg total) by mouth every 6 (six) hours as needed for nausea or vomiting. 60 tablet 1   senna-docusate (SENOKOT-S) 8.6-50 MG tablet Take 1 tablet by mouth 2 (two) times daily. While taking strong pain meds to prevent constipation. 10 tablet 0   triamcinolone  ointment (KENALOG ) 0.5 % Apply 1 Application topically 2 (two) times daily. 30 g 0   varenicline  (CHANTIX ) 1 MG  tablet Take 1 mg by mouth 2 (two) times daily.     Wheat Dextrin (BENEFIBER) POWD Take 0.5 Scoops by mouth daily.     hydrochlorothiazide (HYDRODIURIL) 25 MG tablet Take 25 mg by mouth daily. (Patient not taking: Reported on 03/01/2024)     No current facility-administered medications for this visit.   Facility-Administered Medications Ordered in Other Visits  Medication Dose Route Frequency Provider Last Rate Last Admin   heparin  lock flush 100 unit/mL  500 Units Intravenous Once Agrawal, Kavita, MD        OBJECTIVE: Vitals:   03/01/24 0929  BP: (!) 99/57  Pulse: 92  Resp: 16  Temp: (!) 97 F (36.1 C)  SpO2: 100%      Body mass index is 20.32 kg/m.    ECOG FS:0 - Asymptomatic  General: Well-developed, well-nourished, no acute distress. Eyes: Pink conjunctiva, anicteric sclera. HEENT: Normocephalic, moist mucous membranes. Lungs: No audible wheezing or coughing. Heart: Regular rate and rhythm. Abdomen: Soft, nontender, no obvious distention. Musculoskeletal: No edema, cyanosis, or clubbing. Neuro: Alert, answering all questions appropriately. Cranial nerves grossly intact. Skin: No rashes  or petechiae noted. Psych: Normal affect.  LAB RESULTS:  Lab Results  Component Value Date   NA 134 (L) 03/01/2024   K 4.6 03/01/2024   CL 109 03/01/2024   CO2 16 (L) 03/01/2024   GLUCOSE 118 (H) 03/01/2024   BUN 48 (H) 03/01/2024   CREATININE 2.45 (H) 03/01/2024   CALCIUM  7.9 (L) 03/01/2024   PROT 6.1 (L) 03/01/2024   ALBUMIN  2.9 (L) 03/01/2024   AST 62 (H) 03/01/2024   ALT 42 03/01/2024   ALKPHOS 2,244 (H) 03/01/2024   BILITOT 1.1 03/01/2024   GFRNONAA 28 (L) 03/01/2024    Lab Results  Component Value Date   WBC 4.4 03/01/2024   NEUTROABS 2.2 03/01/2024   HGB 8.1 (L) 03/01/2024   HCT 23.8 (L) 03/01/2024   MCV 91.5 03/01/2024   PLT 25 (L) 03/01/2024     STUDIES: No results found.   ASSESSMENT: Stage IV high-grade urothelial carcinoma.  PLAN:    Stage IV  high-grade urothelial carcinoma: Patient was initially stage IIIb and received neoadjuvant chemotherapy with dose dense MVAC completing treatment on April 05, 2023.  He subsequently underwent cystoprostatectomy on July 07, 2023.  Patient was noted to have residual disease and 2 of 14 lymph nodes positive for malignancy.  He started on adjuvant nivolumab  with plans to do 1 year of maintenance treatment, but developed grade 3 rash.  Per dermatology, rash was related to an exacerbation of psoriasis and not a reaction to his immunotherapy.  Rash resolved with topical treatment and systemic steroids.  Despite this, patient elected to not to reinitiate maintenance treatment.  Imaging on Oct 23, 2023 with no obvious evidence of recurrent or progressive disease.  Repeat imaging on January 26, 2024 revealed new widespread metastatic disease involving liver and bone.  Case discussed with dermatology and current plan is to proceed with treatment using pembrolizumab  and enfortumab vedotin  on day 1 with Enfortumab only on day 8.  This will be a 21-day cycle.  Delay cycle 1, day 8 secondary to worsening thrombocytopenia.  Patient instead will receive IV fluids.  Return to clinic in 1 week for further evaluation and reconsideration of treatment. Psoriasis exacerbation: Case discussed with dermatology.  Pending final infectious workup for hepatitis B, C, and TB, okay from an oncology standpoint to initiate Sotyku.  Patient has completed his steroid taper.   Inconclusive TB results: Patient reports his most recent test were negative.  Continue follow-up with per ID.   Renal insufficiency: Patient developed platinum induced nephrotoxicity after the completion of cycle 4 of his dose dense MVAC.  Creatinine continues to trend up and is now 2.45.  IV fluids as above.  Neither Enfortumab or pembrolizumab  need to be dosed reduced in renal insufficiency.   Transaminitis: Mildly improved.  Possibly related to progressive malignancy.   Neither Enfortumab or pembrolizumab  need to be dose reduced with hepatic insufficiency with a normal bilirubin.   Thrombocytopenia: Patient platelet count dropped to 25.  Hold treatment as well. Nosebleeds: Likely related to thrombocytopenia.  Monitor. Anemia: Hemoglobin trended down to 8.1, monitor. Leukocytosis: Resolved. Rash: Mild, monitor while on immunotherapy.   Patient expressed understanding and was in agreement with this plan. He also understands that He can call clinic at any time with any questions, concerns, or complaints.    Cancer Staging  Bladder cancer [facility]) Staging form: Urinary Bladder, AJCC 8th Edition - Clinical stage from 12/07/2022: Stage II (cT2, cN0, cM0) - Signed by Agrawal, Kavita, MD on 01/02/2023 Stage prefix: Initial  diagnosis WHO/ISUP grade (low/high): High Grade Histologic grading system: 2 grade system - Pathologic stage from 07/07/2023: Stage IIIB (ypT2a, ypN2, cM0) - Signed by Agrawal, Kavita, MD on 08/31/2023 Stage prefix: Post-therapy Response to neoadjuvant therapy: Partial response WHO/ISUP grade (low/high): High Grade Histologic grading system: 2 grade system   Evalene JINNY Reusing, MD   03/01/2024 12:44 PM

## 2024-03-01 NOTE — Progress Notes (Signed)
 IV fluids & IV Dex only today per MD

## 2024-03-03 ENCOUNTER — Other Ambulatory Visit: Payer: Self-pay

## 2024-03-05 ENCOUNTER — Telehealth: Payer: Self-pay

## 2024-03-05 NOTE — Telephone Encounter (Signed)
 The following documentation is from secure chat between Dr. Hester, Dr. Donald Berlin and Dr. Evalene Lake.    Dr.Kowalski can you call me regarding this patient- [phone]  JR if my cell is not ringing then please call [phone]. thx  11:12 AM Evalene JINNY Reusing, MD was added by Donald Berlin, MD. 11:33 AM Donald Berlin, MD Doctors, this patient has a complicated medical hisotry- the immune check point inhibitors broke him into a rash which turned out to be psoriasis. 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 so where do we stand?   JR I spoek to the patient and he wanted all of us  to discuss his case and come up with a plan that will not cause further problem  11:36 AM TF Evalene JINNY Reusing, MD Most of his laboratory work is off either directly related to his cancer or his treatment. His renal insufficiency does seem to be getting worse though. Given the aggressiveness of his malignancy, discontinuing chemotherapy is really not an option. I am okay with treating the latent TB if necessary, but his malignancy definitely is the bigger problem  11:40 AM JR Donald Berlin, MD I think treating for latent TB with abnormal LFTS is not safe now as the meds rifmapin or INH can mess his liver- so I am going to hold to se ehow his labs do first  11:44 AM TF Evalene JINNY Reusing, MD Sounds like a good plan to me. I will be checking labs frequently.  11:52 AM DK Alm JAYSON Hester, MD After pt is on treatment for Latent TB for at least 4 weeks, I will start Sotyktu for Psoriasis if pt is still interested.   12:41 PM You  were added by Alm JAYSON Hester, MD. 2:55 PM DK   In summary, Dr. Reusing will be checking patients labs.  Once his LFTs improve, Dr. Berlin will treat patient for latent TB.  After patient is on treatment for latent TB for at least 4 weeks we can start Sotyktu for psoriasis if patient is still interested./sh

## 2024-03-07 ENCOUNTER — Other Ambulatory Visit: Payer: Self-pay | Admitting: *Deleted

## 2024-03-07 DIAGNOSIS — C679 Malignant neoplasm of bladder, unspecified: Secondary | ICD-10-CM

## 2024-03-08 ENCOUNTER — Inpatient Hospital Stay

## 2024-03-08 ENCOUNTER — Inpatient Hospital Stay (HOSPITAL_BASED_OUTPATIENT_CLINIC_OR_DEPARTMENT_OTHER): Admitting: Oncology

## 2024-03-08 ENCOUNTER — Other Ambulatory Visit: Payer: Self-pay | Admitting: *Deleted

## 2024-03-08 ENCOUNTER — Telehealth: Payer: Self-pay

## 2024-03-08 ENCOUNTER — Encounter: Payer: Self-pay | Admitting: Oncology

## 2024-03-08 VITALS — BP 136/78 | HR 96 | Temp 96.9°F | Resp 16 | Wt 144.0 lb

## 2024-03-08 DIAGNOSIS — D649 Anemia, unspecified: Secondary | ICD-10-CM

## 2024-03-08 DIAGNOSIS — Z5112 Encounter for antineoplastic immunotherapy: Secondary | ICD-10-CM | POA: Diagnosis not present

## 2024-03-08 DIAGNOSIS — C679 Malignant neoplasm of bladder, unspecified: Secondary | ICD-10-CM | POA: Diagnosis not present

## 2024-03-08 LAB — CMP (CANCER CENTER ONLY)
ALT: 17 U/L (ref 0–44)
AST: 28 U/L (ref 15–41)
Albumin: 2.6 g/dL — ABNORMAL LOW (ref 3.5–5.0)
Alkaline Phosphatase: 2177 U/L — ABNORMAL HIGH (ref 38–126)
Anion gap: 9 (ref 5–15)
BUN: 28 mg/dL — ABNORMAL HIGH (ref 8–23)
CO2: 19 mmol/L — ABNORMAL LOW (ref 22–32)
Calcium: 7.7 mg/dL — ABNORMAL LOW (ref 8.9–10.3)
Chloride: 109 mmol/L (ref 98–111)
Creatinine: 1.89 mg/dL — ABNORMAL HIGH (ref 0.61–1.24)
GFR, Estimated: 39 mL/min — ABNORMAL LOW (ref >60)
Glucose, Bld: 109 mg/dL — ABNORMAL HIGH (ref 70–99)
Potassium: 4.1 mmol/L (ref 3.5–5.1)
Sodium: 137 mmol/L (ref 135–145)
Total Bilirubin: 0.7 mg/dL (ref 0.0–1.2)
Total Protein: 5.4 g/dL — ABNORMAL LOW (ref 6.5–8.1)

## 2024-03-08 LAB — CBC WITH DIFFERENTIAL (CANCER CENTER ONLY)
Abs Immature Granulocytes: 0.64 K/uL — ABNORMAL HIGH (ref 0.00–0.07)
Basophils Absolute: 0 K/uL (ref 0.0–0.1)
Basophils Relative: 0 %
Eosinophils Absolute: 0.2 K/uL (ref 0.0–0.5)
Eosinophils Relative: 4 %
HCT: 18 % — ABNORMAL LOW (ref 39.0–52.0)
Hemoglobin: 5.9 g/dL — CL (ref 13.0–17.0)
Immature Granulocytes: 11 %
Lymphocytes Relative: 22 %
Lymphs Abs: 1.3 K/uL (ref 0.7–4.0)
MCH: 30.7 pg (ref 26.0–34.0)
MCHC: 32.8 g/dL (ref 30.0–36.0)
MCV: 93.8 fL (ref 80.0–100.0)
Monocytes Absolute: 0.8 K/uL (ref 0.1–1.0)
Monocytes Relative: 14 %
Neutro Abs: 2.8 K/uL (ref 1.7–7.7)
Neutrophils Relative %: 49 %
Platelet Count: 32 K/uL — ABNORMAL LOW (ref 150–400)
RBC: 1.92 MIL/uL — ABNORMAL LOW (ref 4.22–5.81)
RDW: 16.4 % — ABNORMAL HIGH (ref 11.5–15.5)
WBC Count: 5.7 K/uL (ref 4.0–10.5)
nRBC: 8.9 % — ABNORMAL HIGH (ref 0.0–0.2)

## 2024-03-08 LAB — PREPARE RBC (CROSSMATCH)

## 2024-03-08 MED ORDER — TRAMADOL HCL 50 MG PO TABS: 50.0000 mg | ORAL_TABLET | Freq: Four times a day (QID) | ORAL | 1 refills | Status: DC | PRN

## 2024-03-08 MED ORDER — DIPHENHYDRAMINE HCL 50 MG/ML IJ SOLN: 25.0000 mg | Freq: Once | INTRAMUSCULAR | Status: DC

## 2024-03-08 MED ORDER — SODIUM CHLORIDE 0.9% IV SOLUTION
250.0000 mL | INTRAVENOUS | Status: DC
  Administered 2024-03-08: 100 mL via INTRAVENOUS
  Filled 2024-03-08: qty 250

## 2024-03-08 MED ORDER — ACETAMINOPHEN 325 MG PO TABS
650.0000 mg | ORAL_TABLET | Freq: Once | ORAL | Status: AC
  Administered 2024-03-08: 650 mg via ORAL
  Filled 2024-03-08: qty 2

## 2024-03-08 NOTE — Telephone Encounter (Signed)
 Called and spoke with [facility] on [address] in [place] telling them that we sent this patients tramadol  prescription there by mistake, so was wanting them to delete it. She told me that they would delete the prescription from their pharmacy.

## 2024-03-08 NOTE — Progress Notes (Signed)
 [facility]  [phone] [phone]  ID: Ryan Dixon OB: Feb 13, 1956  MR#: [id number]  RDW#:[id number]  Patient Care Team: [name], MD as PCP - General (Family Medicine) [name], MD as Consulting Physician (Oncology)  CHIEF COMPLAINT: Stage IV high-grade urothelial carcinoma.  INTERVAL HISTORY: Patient returns to clinic today for further evaluation and reconsideration of cycle 1, day 8 of Enfortumab and pembrolizumab .  Enfortumab only today.  He continues to have increased weakness and fatigue.  He has bilateral hip pain which he relates to problems with his back.  He has been taking tons of Advil, for relief.  He also admits to dark tarry stools.  He has no neurologic complaints.  He denies any recent fevers or illnesses.  He has no chest pain, shortness of breath, cough, or hemoptysis.  He denies any abdominal pain.  He denies any nausea, vomiting, constipation, or diarrhea.  He has no urinary complaints.  Patient offers no further specific complaints today.  REVIEW OF SYSTEMS:   Review of Systems  Constitutional:  Positive for malaise/fatigue and weight loss. Negative for fever.  HENT:  Negative for nosebleeds.   Respiratory: Negative.  Negative for cough, hemoptysis and shortness of breath.   Cardiovascular: Negative.  Negative for chest pain and leg swelling.  Gastrointestinal:  Positive for melena. Negative for abdominal pain.  Genitourinary: Negative.  Negative for dysuria.  Musculoskeletal:  Positive for joint pain. Negative for back pain.  Skin: Negative.  Negative for itching and rash.  Neurological:  Positive for weakness. Negative for dizziness, focal weakness and headaches.  Psychiatric/Behavioral: Negative.  The patient is not nervous/anxious.     As per HPI. Otherwise, a complete review of systems is negative.  PAST MEDICAL HISTORY: Past Medical History:  Diagnosis Date   Aortic atherosclerosis    Chronic  foot pain, left    History of hematuria    Hypertension    Other emphysema (HCC)    Other male erectile dysfunction    Pre-diabetes    Pure hypercholesterolemia    Tobacco dependence     PAST SURGICAL HISTORY: Past Surgical History:  Procedure Laterality Date   ANKLE ARTHROSCOPY Left 08/11/2020   Procedure: LEFT ANKLE ARTHROSCOPY AND DEBRIDEMENT;  Surgeon: [name], MD;  Location: [facility];  Service: Orthopedics;  Laterality: Left;   BLADDER INSTILLATION N/A 11/29/2022   Procedure: BLADDER INSTILLATION OF GEMCITABINE ;  Surgeon: [name], MD;  Location: [facility];  Service: Urology;  Laterality: N/A;   COLONOSCOPY     CYSTOSCOPY WITH INJECTION N/A 07/07/2023   Procedure: CYSTOSCOPY WITH INJECTION;  Surgeon: [name], MD;  Location: [facility];  Service: Urology;  Laterality: N/A;   IR IMAGING GUIDED PORT INSERTION  01/20/2023   PELVIC LYMPH NODE DISSECTION N/A 07/07/2023   Procedure: PELVIC LYMPH NODE DISSECTION;  Surgeon: [name], MD;  Location: [facility];  Service: Urology;  Laterality: N/A;   ROBOT ASSISTED LAPAROSCOPIC COMPLETE CYSTECT ILEAL CONDUIT N/A 07/07/2023   Procedure: XI ROBOTIC ASSISTED LAPAROSCOPIC COMPLETE CYSTECT ILEAL CONDUIT, UMBILICAL HERNIA REPAIR, BILATERAL INGUINAL HERNIA REPAIR;  Surgeon: [name], MD;  Location: [facility];  Service: Urology;  Laterality: N/A;   ROBOT ASSISTED LAPAROSCOPIC RADICAL PROSTATECTOMY N/A 07/07/2023   Procedure: XI ROBOTIC ASSISTED LAPAROSCOPIC RADICAL PROSTATECTOMY;  Surgeon: [name], MD;  Location: [facility];  Service: Urology;  Laterality: N/A;   TONSILLECTOMY     TRANSURETHRAL RESECTION OF BLADDER  TUMOR N/A 11/29/2022   Procedure: TRANSURETHRAL RESECTION OF BLADDER TUMOR (TURBT);  Surgeon: [name], MD;  Location: [facility];  Service: Urology;  Laterality: N/A;    FAMILY HISTORY: History reviewed. No pertinent family history.  ADVANCED DIRECTIVES (Y/N):   N  HEALTH MAINTENANCE: Social History   Tobacco Use   Smoking status: Every Day    Current packs/day: 2.00    Average packs/day: 2.0 packs/day for 43.0 years (86.0 ttl pk-yrs)    Types: Cigarettes   Smokeless tobacco: Never  Substance Use Topics   Alcohol use: Yes    Comment: 1-2 beers/day   Drug use: Never     Colonoscopy:  PAP:  Bone density:  Lipid panel:  Allergies  Allergen Reactions   Nsaids Other (See Comments)    GI upset   Other Other (See Comments)    Anti-inflammatories cause GI upset, N/V    Current Outpatient Medications  Medication Sig Dispense Refill   albuterol  (VENTOLIN  HFA) 108 (90 Base) MCG/ACT inhaler Inhale 2 puffs into the lungs every 6 (six) hours as needed for wheezing or shortness of breath. 8 g 2   atorvastatin  (LIPITOR) 20 MG tablet TAKE 1 TABLET BY MOUTH EVERY DAY AT NIGHT     Betamethasone  Dipropionate (SERNIVO ) 0.05 % EMUL Apply topically twice daily to itchy rash as needed. Avoid face, groin, axilla 120 mL 4   Carboxymethylcell-Glycerin  PF 0.5-0.9 % SOLN Place 2 drops into both eyes 4 (four) times daily. 1 each 11   lidocaine -prilocaine  (EMLA ) cream Apply to affected area once 30 g 3   mupirocin  ointment (BACTROBAN ) 2 % Apply topically to wound at right thigh twice daily and cover until healed 30 g 1   ondansetron  (ZOFRAN ) 8 MG tablet Take 1 tablet (8 mg total) by mouth every 8 (eight) hours as needed for nausea or vomiting. 60 tablet 1   oxyCODONE  (ROXICODONE ) 5 MG immediate release tablet Take 1 tablet (5 mg total) by mouth every 6 (six) hours as needed for moderate pain (pain score 4-6) or severe pain (pain score 7-10) (post-operatively). 15 tablet 0   prochlorperazine  (COMPAZINE ) 10 MG tablet Take 1 tablet (10 mg total) by mouth every 6 (six) hours as needed for nausea or vomiting. 60 tablet 1   senna-docusate (SENOKOT-S) 8.6-50 MG tablet Take 1 tablet by mouth 2 (two) times daily. While taking strong pain meds to prevent constipation. 10  tablet 0   triamcinolone  ointment (KENALOG ) 0.5 % Apply 1 Application topically 2 (two) times daily. 30 g 0   varenicline  (CHANTIX ) 1 MG tablet Take 1 mg by mouth 2 (two) times daily.     Wheat Dextrin (BENEFIBER) POWD Take 0.5 Scoops by mouth daily.     hydrochlorothiazide (HYDRODIURIL) 25 MG tablet Take 25 mg by mouth daily. (Patient not taking: Reported on 03/08/2024)     No current facility-administered medications for this visit.   Facility-Administered Medications Ordered in Other Visits  Medication Dose Route Frequency Provider Last Rate Last Admin   0.9 %  sodium chloride  infusion (Manually program via Guardrails IV Fluids)  250 mL Intravenous Continuous Zaneta Lightcap J, MD   Stopped at 03/08/24 1450   diphenhydrAMINE  (BENADRYL ) injection 25 mg  25 mg Intravenous Once Lacee Grey J, MD       heparin  lock flush 100 unit/mL  500 Units Intravenous Once Agrawal, Kavita, MD        OBJECTIVE: Vitals:   03/08/24 1152  BP: 136/78  Pulse: 96  Resp: 16  Temp: (!) 96.9 F (36.1 C)  SpO2: 97%       Body mass index is 20.66 kg/m.    ECOG FS:0 - Asymptomatic  General: Well-developed, well-nourished, no acute distress. Eyes: Pink conjunctiva, anicteric sclera. HEENT: Normocephalic, moist mucous membranes. Lungs: No audible wheezing or coughing. Heart: Regular rate and rhythm. Abdomen: Soft, nontender, no obvious distention. Musculoskeletal: No edema, cyanosis, or clubbing. Neuro: Alert, answering all questions appropriately. Cranial nerves grossly intact. Skin: No rashes or petechiae noted. Psych: Normal affect.  LAB RESULTS:  Lab Results  Component Value Date   NA 137 03/08/2024   K 4.1 03/08/2024   CL 109 03/08/2024   CO2 19 (L) 03/08/2024   GLUCOSE 109 (H) 03/08/2024   BUN 28 (H) 03/08/2024   CREATININE 1.89 (H) 03/08/2024   CALCIUM  7.7 (L) 03/08/2024   PROT 5.4 (L) 03/08/2024   ALBUMIN  2.6 (L) 03/08/2024   AST 28 03/08/2024   ALT 17 03/08/2024   ALKPHOS  2,177 (H) 03/08/2024   BILITOT 0.7 03/08/2024   GFRNONAA 39 (L) 03/08/2024    Lab Results  Component Value Date   WBC 5.7 03/08/2024   NEUTROABS 2.8 03/08/2024   HGB 5.9 (LL) 03/08/2024   HCT 18.0 (L) 03/08/2024   MCV 93.8 03/08/2024   PLT 32 (L) 03/08/2024     STUDIES: No results found.   ASSESSMENT: Stage IV high-grade urothelial carcinoma.  PLAN:    Stage IV high-grade urothelial carcinoma: Patient was initially stage IIIb and received neoadjuvant chemotherapy with dose dense MVAC completing treatment on April 05, 2023.  He subsequently underwent cystoprostatectomy on July 07, 2023.  Patient was noted to have residual disease and 2 of 14 lymph nodes positive for malignancy.  He started on adjuvant nivolumab  with plans to do 1 year of maintenance treatment, but developed grade 3 rash.  Per dermatology, rash was related to an exacerbation of psoriasis and not a reaction to his immunotherapy.  Rash resolved with topical treatment and systemic steroids.  Despite this, patient elected to not to reinitiate maintenance treatment.  Imaging on Oct 23, 2023 with no obvious evidence of recurrent or progressive disease.  Repeat imaging on January 26, 2024 revealed new widespread metastatic disease involving liver and bone.  Plan is to treat with pembrolizumab  and enfortumab vedotin  on day 1 with Enfortumab only on day 8.  This will be a 21-day cycle.  Once again delay cycle 1, day 8 secondary to thrombocytopenia and now worsening anemia.  Patient will receive 1 unit of packed red blood cells today and again on Monday.  Defer treatment plan 1 week.   Psoriasis exacerbation: Case discussed with dermatology.  Dermatology intends to hold initiating Sotyku until blood counts improved. 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  Continue follow-up with per ID.   Renal insufficiency: Patient developed platinum induced nephrotoxicity after the completion of cycle 4 of his  dose dense MVAC.  Creatinine improved to 1.98.  Neither Enfortumab or pembrolizumab  need to be dosed reduced in renal insufficiency.   Transaminitis: Resolved.  Neither Enfortumab or pembrolizumab  need to be dose reduced with hepatic insufficiency with a normal bilirubin.   Thrombocytopenia: Platelet count only improved to 32.  Continue to hold treatment.   Nosebleeds: Likely related to thrombocytopenia.  Monitor. Anemia: Hemoglobin significantly dropped to 5.9.  Suspect this is a combination of chemotherapy, thrombocytopenia and possible bleeding from gastritis given the amount of Advil patient was taking.  He has been instructed to  discontinue Advil altogether.  Patient will receive 1 unit of blood today and a second unit on Monday. Leukocytosis: Resolved. Rash: Patient does not complain of this today.  Monitor while on immunotherapy. Dark tarry stools: Likely secondary to Advil intake and thrombocytopenia.  Monitor. Pain: Patient has been instructed to take Tylenol  and tramadol .  Discontinue Advil altogether.   Patient expressed understanding and was in agreement with this plan. He also understands that He can call clinic at any time with any questions, concerns, or complaints.    Cancer Staging  Bladder cancer [facility]) Staging form: Urinary Bladder, AJCC 8th Edition - Clinical stage from 12/07/2022: Stage II (cT2, cN0, cM0) - Signed by Agrawal, Kavita, MD on 01/02/2023 Stage prefix: Initial diagnosis WHO/ISUP grade (low/high): High Grade Histologic grading system: 2 grade system - Pathologic stage from 07/07/2023: Stage IIIB (ypT2a, ypN2, cM0) - Signed by Agrawal, Kavita, MD on 08/31/2023 Stage prefix: Post-therapy Response to neoadjuvant therapy: Partial response WHO/ISUP grade (low/high): High Grade Histologic grading system: 2 grade system   Evalene JINNY Reusing, MD   03/08/2024 3:11 PM

## 2024-03-11 ENCOUNTER — Inpatient Hospital Stay

## 2024-03-11 ENCOUNTER — Inpatient Hospital Stay: Admitting: Oncology

## 2024-03-11 DIAGNOSIS — C679 Malignant neoplasm of bladder, unspecified: Secondary | ICD-10-CM

## 2024-03-11 DIAGNOSIS — Z5112 Encounter for antineoplastic immunotherapy: Secondary | ICD-10-CM | POA: Diagnosis not present

## 2024-03-11 DIAGNOSIS — D649 Anemia, unspecified: Secondary | ICD-10-CM

## 2024-03-11 LAB — CBC WITH DIFFERENTIAL/PLATELET
Abs Immature Granulocytes: 0.94 K/uL — ABNORMAL HIGH (ref 0.00–0.07)
Basophils Absolute: 0 K/uL (ref 0.0–0.1)
Basophils Relative: 0 %
Eosinophils Absolute: 0.2 K/uL (ref 0.0–0.5)
Eosinophils Relative: 3 %
HCT: 23.3 % — ABNORMAL LOW (ref 39.0–52.0)
Hemoglobin: 7.6 g/dL — ABNORMAL LOW (ref 13.0–17.0)
Immature Granulocytes: 13 %
Lymphocytes Relative: 20 %
Lymphs Abs: 1.4 K/uL (ref 0.7–4.0)
MCH: 30.3 pg (ref 26.0–34.0)
MCHC: 32.6 g/dL (ref 30.0–36.0)
MCV: 92.8 fL (ref 80.0–100.0)
Monocytes Absolute: 0.9 K/uL (ref 0.1–1.0)
Monocytes Relative: 13 %
Neutro Abs: 3.5 K/uL (ref 1.7–7.7)
Neutrophils Relative %: 51 %
Platelets: 32 K/uL — ABNORMAL LOW (ref 150–400)
RBC: 2.51 MIL/uL — ABNORMAL LOW (ref 4.22–5.81)
RDW: 16.6 % — ABNORMAL HIGH (ref 11.5–15.5)
WBC: 7.1 K/uL (ref 4.0–10.5)
nRBC: 4.1 % — ABNORMAL HIGH (ref 0.0–0.2)

## 2024-03-11 LAB — CMP (CANCER CENTER ONLY)
ALT: 13 U/L (ref 0–44)
AST: 23 U/L (ref 15–41)
Albumin: 2.7 g/dL — ABNORMAL LOW (ref 3.5–5.0)
Alkaline Phosphatase: 2611 U/L — ABNORMAL HIGH (ref 38–126)
Anion gap: 6 (ref 5–15)
BUN: 25 mg/dL — ABNORMAL HIGH (ref 8–23)
CO2: 20 mmol/L — ABNORMAL LOW (ref 22–32)
Calcium: 8.2 mg/dL — ABNORMAL LOW (ref 8.9–10.3)
Chloride: 109 mmol/L (ref 98–111)
Creatinine: 1.84 mg/dL — ABNORMAL HIGH (ref 0.61–1.24)
GFR, Estimated: 40 mL/min — ABNORMAL LOW (ref >60)
Glucose, Bld: 88 mg/dL (ref 70–99)
Potassium: 4.5 mmol/L (ref 3.5–5.1)
Sodium: 135 mmol/L (ref 135–145)
Total Bilirubin: 0.4 mg/dL (ref 0.0–1.2)
Total Protein: 5.5 g/dL — ABNORMAL LOW (ref 6.5–8.1)

## 2024-03-11 MED ORDER — ACETAMINOPHEN 325 MG PO TABS
650.0000 mg | ORAL_TABLET | Freq: Once | ORAL | Status: AC
  Administered 2024-03-11: 650 mg via ORAL
  Filled 2024-03-11: qty 2

## 2024-03-11 MED ORDER — SODIUM CHLORIDE 0.9% IV SOLUTION
250.0000 mL | INTRAVENOUS | Status: DC
  Administered 2024-03-11: 100 mL via INTRAVENOUS
  Filled 2024-03-11: qty 250

## 2024-03-11 MED ORDER — DIPHENHYDRAMINE HCL 50 MG/ML IJ SOLN: 25.0000 mg | Freq: Once | INTRAMUSCULAR | Status: DC

## 2024-03-11 NOTE — Progress Notes (Signed)
 [facility]  Telephone:(336) [phone] Fax:(336) [phone]  ID: Ryan Dixon OB: 02/03/1956  MR#: [id number]  RDW#:[id number]  Patient Care Team: [name], MD as PCP - General (Family Medicine) [name], MD as Consulting Physician (Oncology)  CHIEF COMPLAINT: Stage IV high-grade urothelial carcinoma.  INTERVAL HISTORY: Patient returns to clinic today for further evaluation and additional blood transfusion.  He has chronic weakness and fatigue, but otherwise feels well.  He is not complaining of pain today.  He has no neurologic complaints.  He denies any recent fevers or illnesses.  He has no chest pain, shortness of breath, cough, or hemoptysis.  He denies any abdominal pain.  He denies any nausea, vomiting, constipation, or diarrhea.  He has no urinary complaints.  Patient offers no further specific complaints today.    REVIEW OF SYSTEMS:   Review of Systems  Constitutional:  Positive for malaise/fatigue and weight loss. Negative for fever.  HENT:  Negative for nosebleeds.   Respiratory: Negative.  Negative for cough, hemoptysis and shortness of breath.   Cardiovascular: Negative.  Negative for chest pain and leg swelling.  Gastrointestinal: Negative.  Negative for abdominal pain and melena.  Genitourinary: Negative.  Negative for dysuria.  Musculoskeletal: Negative.  Negative for back pain and joint pain.  Skin: Negative.  Negative for itching and rash.  Neurological:  Positive for weakness. Negative for dizziness, focal weakness and headaches.  Psychiatric/Behavioral: Negative.  The patient is not nervous/anxious.     As per HPI. Otherwise, a complete review of systems is negative.  PAST MEDICAL HISTORY: Past Medical History:  Diagnosis Date   Aortic atherosclerosis    Chronic foot pain, left    History of hematuria    Hypertension    Other emphysema (HCC)    Other male erectile dysfunction    Pre-diabetes    Pure hypercholesterolemia     Tobacco dependence     PAST SURGICAL HISTORY: Past Surgical History:  Procedure Laterality Date   ANKLE ARTHROSCOPY Left 08/11/2020   Procedure: LEFT ANKLE ARTHROSCOPY AND DEBRIDEMENT;  Surgeon: [name], MD;  Location: [facility];  Service: Orthopedics;  Laterality: Left;   BLADDER INSTILLATION N/A 11/29/2022   Procedure: BLADDER INSTILLATION OF GEMCITABINE ;  Surgeon: [name], MD;  Location: [facility];  Service: Urology;  Laterality: N/A;   COLONOSCOPY     CYSTOSCOPY WITH INJECTION N/A 07/07/2023   Procedure: CYSTOSCOPY WITH INJECTION;  Surgeon: [name], MD;  Location: [facility];  Service: Urology;  Laterality: N/A;   IR IMAGING GUIDED PORT INSERTION  01/20/2023   PELVIC LYMPH NODE DISSECTION N/A 07/07/2023   Procedure: PELVIC LYMPH NODE DISSECTION;  Surgeon: [name], MD;  Location: [facility];  Service: Urology;  Laterality: N/A;   ROBOT ASSISTED LAPAROSCOPIC COMPLETE CYSTECT ILEAL CONDUIT N/A 07/07/2023   Procedure: XI ROBOTIC ASSISTED LAPAROSCOPIC COMPLETE CYSTECT ILEAL CONDUIT, UMBILICAL HERNIA REPAIR, BILATERAL INGUINAL HERNIA REPAIR;  Surgeon: [name], MD;  Location: [facility];  Service: Urology;  Laterality: N/A;   ROBOT ASSISTED LAPAROSCOPIC RADICAL PROSTATECTOMY N/A 07/07/2023   Procedure: XI ROBOTIC ASSISTED LAPAROSCOPIC RADICAL PROSTATECTOMY;  Surgeon: [name], MD;  Location: [facility];  Service: Urology;  Laterality: N/A;   TONSILLECTOMY     TRANSURETHRAL RESECTION OF BLADDER TUMOR N/A 11/29/2022   Procedure: TRANSURETHRAL RESECTION OF BLADDER TUMOR (TURBT);  Surgeon: [name], MD;  Location: [facility];  Service: Urology;  Laterality: N/A;  FAMILY HISTORY: No family history on file.  ADVANCED DIRECTIVES (Y/N):  N  HEALTH MAINTENANCE: Social History   Tobacco Use   Smoking status: Every Day    Current packs/day: 2.00    Average packs/day: 2.0 packs/day for 43.0 years (86.0 ttl pk-yrs)     Types: Cigarettes   Smokeless tobacco: Never  Substance Use Topics   Alcohol use: Yes    Comment: 1-2 beers/day   Drug use: Never     Colonoscopy:  PAP:  Bone density:  Lipid panel:  Allergies  Allergen Reactions   Nsaids Other (See Comments)    GI upset   Other Other (See Comments)    Anti-inflammatories cause GI upset, N/V    Current Outpatient Medications  Medication Sig Dispense Refill   albuterol  (VENTOLIN  HFA) 108 (90 Base) MCG/ACT inhaler Inhale 2 puffs into the lungs every 6 (six) hours as needed for wheezing or shortness of breath. 8 g 2   atorvastatin  (LIPITOR) 20 MG tablet TAKE 1 TABLET BY MOUTH EVERY DAY AT NIGHT     Betamethasone  Dipropionate (SERNIVO ) 0.05 % EMUL Apply topically twice daily to itchy rash as needed. Avoid face, groin, axilla 120 mL 4   Carboxymethylcell-Glycerin  PF 0.5-0.9 % SOLN Place 2 drops into both eyes 4 (four) times daily. 1 each 11   hydrochlorothiazide (HYDRODIURIL) 25 MG tablet Take 25 mg by mouth daily. (Patient not taking: Reported on 03/08/2024)     lidocaine -prilocaine  (EMLA ) cream Apply to affected area once 30 g 3   mupirocin  ointment (BACTROBAN ) 2 % Apply topically to wound at right thigh twice daily and cover until healed 30 g 1   ondansetron  (ZOFRAN ) 8 MG tablet Take 1 tablet (8 mg total) by mouth every 8 (eight) hours as needed for nausea or vomiting. 60 tablet 1   oxyCODONE  (ROXICODONE ) 5 MG immediate release tablet Take 1 tablet (5 mg total) by mouth every 6 (six) hours as needed for moderate pain (pain score 4-6) or severe pain (pain score 7-10) (post-operatively). 15 tablet 0   prochlorperazine  (COMPAZINE ) 10 MG tablet Take 1 tablet (10 mg total) by mouth every 6 (six) hours as needed for nausea or vomiting. 60 tablet 1   senna-docusate (SENOKOT-S) 8.6-50 MG tablet Take 1 tablet by mouth 2 (two) times daily. While taking strong pain meds to prevent constipation. 10 tablet 0   traMADol  (ULTRAM ) 50 MG tablet Take 1 tablet (50 mg  total) by mouth every 6 (six) hours as needed. 90 tablet 1   triamcinolone  ointment (KENALOG ) 0.5 % Apply 1 Application topically 2 (two) times daily. 30 g 0   varenicline  (CHANTIX ) 1 MG tablet Take 1 mg by mouth 2 (two) times daily.     Wheat Dextrin (BENEFIBER) POWD Take 0.5 Scoops by mouth daily.     No current facility-administered medications for this visit.   Facility-Administered Medications Ordered in Other Visits  Medication Dose Route Frequency Provider Last Rate Last Admin   0.9 %  sodium chloride  infusion (Manually program via Guardrails IV Fluids)  250 mL Intravenous Continuous Briget Shaheed J, MD   Stopped at 03/11/24 1109   diphenhydrAMINE  (BENADRYL ) injection 25 mg  25 mg Intravenous Once Duy Lemming J, MD       heparin  lock flush 100 unit/mL  500 Units Intravenous Once Agrawal, Kavita, MD        OBJECTIVE: There were no vitals filed for this visit.     There is no height or weight on file to calculate  BMI.    ECOG FS:0 - Asymptomatic  General: Well-developed, well-nourished, no acute distress. Eyes: Pink conjunctiva, anicteric sclera. HEENT: Normocephalic, moist mucous membranes. Lungs: No audible wheezing or coughing. Heart: Regular rate and rhythm. Abdomen: Soft, nontender, no obvious distention. Musculoskeletal: No edema, cyanosis, or clubbing. Neuro: Alert, answering all questions appropriately. Cranial nerves grossly intact. Skin: No rashes or petechiae noted. Psych: Normal affect.  LAB RESULTS:  Lab Results  Component Value Date   NA 135 03/11/2024   K 4.5 03/11/2024   CL 109 03/11/2024   CO2 20 (L) 03/11/2024   GLUCOSE 88 03/11/2024   BUN 25 (H) 03/11/2024   CREATININE 1.84 (H) 03/11/2024   CALCIUM  8.2 (L) 03/11/2024   PROT 5.5 (L) 03/11/2024   ALBUMIN  2.7 (L) 03/11/2024   AST 23 03/11/2024   ALT 13 03/11/2024   ALKPHOS 2,611 (H) 03/11/2024   BILITOT 0.4 03/11/2024   GFRNONAA 40 (L) 03/11/2024    Lab Results  Component Value Date    WBC 7.1 03/11/2024   NEUTROABS 3.5 03/11/2024   HGB 7.6 (L) 03/11/2024   HCT 23.3 (L) 03/11/2024   MCV 92.8 03/11/2024   PLT 32 (L) 03/11/2024     STUDIES: No results found.   ASSESSMENT: Stage IV high-grade urothelial carcinoma.  PLAN:    Stage IV high-grade urothelial carcinoma: Patient was initially stage IIIb and received neoadjuvant chemotherapy with dose dense MVAC completing treatment on April 05, 2023.  He subsequently underwent cystoprostatectomy on July 07, 2023.  Patient was noted to have residual disease and 2 of 14 lymph nodes positive for malignancy.  He started on adjuvant nivolumab  with plans to do 1 year of maintenance treatment, but developed grade 3 rash.  Per dermatology, rash was related to an exacerbation of psoriasis and not a reaction to his immunotherapy.  Rash resolved with topical treatment and systemic steroids.  Despite this, patient elected to not to reinitiate maintenance treatment.  Imaging on Oct 23, 2023 with no obvious evidence of recurrent or progressive disease.  Repeat imaging on January 26, 2024 revealed new widespread metastatic disease involving liver and bone.  Plan is to treat with pembrolizumab  and enfortumab vedotin  on day 1 with Enfortumab only on day 8.  This will be a 21-day cycle.  Given patient's persistent thrombocytopenia, we will cancel cycle 1, day 8 of treatment.  He will return to clinic on Friday, March 15, 2024 for consideration of cycle 2, day 1.  If he continues to have significant thrombocytopenia we will consider proceeding with Keytruda  only.   Psoriasis exacerbation: Case discussed with dermatology.  Dermatology intends to hold initiating Sotyku until blood counts improved. 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  Continue follow-up with per ID.   Renal insufficiency: Patient developed platinum induced nephrotoxicity after the completion of cycle 4 of his dose dense MVAC.  Creatinine has  trended up slightly to 1.84.  Neither Enfortumab or pembrolizumab  need to be dosed reduced in renal insufficiency.   Transaminitis: Resolved.  Neither Enfortumab or pembrolizumab  need to be dose reduced with hepatic insufficiency with a normal bilirubin.   Thrombocytopenia: Chronic and unchanged.  Patient's platelet count is 32 today.  Consider proceeding with Keytruda  only as above.   Nosebleeds: Patient does not complain of this today.  Likely related to thrombocytopenia.  Monitor. Anemia: Globin improved to 7.6 with transfusion.  Proceed with 1 unit packed red blood cells today.  Suspect this is a combination of chemotherapy, thrombocytopenia and  possible bleeding from gastritis given the amount of Advil patient was taking.  He has been instructed to discontinue Advil altogether.   Rash: Patient does not complain of this today.  Monitor while on immunotherapy. Dark tarry stools: Likely secondary to Advil intake and thrombocytopenia.  Monitor. Pain: Patient has been instructed to take Tylenol  and tramadol .  Discontinue Advil altogether.  I spent a total of 30 minutes reviewing chart data, face-to-face evaluation with the patient, counseling and coordination of care as detailed above.  Patient expressed understanding and was in agreement with this plan. He also understands that He can call clinic at any time with any questions, concerns, or complaints.    Cancer Staging  Bladder cancer [facility]) Staging form: Urinary Bladder, AJCC 8th Edition - Clinical stage from 12/07/2022: Stage II (cT2, cN0, cM0) - Signed by Agrawal, Kavita, MD on 01/02/2023 Stage prefix: Initial diagnosis WHO/ISUP grade (low/high): High Grade Histologic grading system: 2 grade system - Pathologic stage from 07/07/2023: Stage IIIB (ypT2a, ypN2, cM0) - Signed by Agrawal, Kavita, MD on 08/31/2023 Stage prefix: Post-therapy Response to neoadjuvant therapy: Partial response WHO/ISUP grade (low/high): High Grade Histologic grading  system: 2 grade system   Evalene JINNY Reusing, MD   03/11/2024 2:24 PM

## 2024-03-11 NOTE — Progress Notes (Signed)
 MD will see patient in infusion.

## 2024-03-12 ENCOUNTER — Other Ambulatory Visit: Payer: Self-pay

## 2024-03-12 ENCOUNTER — Encounter: Payer: Self-pay | Admitting: Oncology

## 2024-03-12 LAB — BPAM RBC
Blood Product Expiration Date: 202510212359
Blood Product Expiration Date: 202510252359
ISSUE DATE / TIME: 202509261250
ISSUE DATE / TIME: 202509290909
Unit Type and Rh: 202510252359
Unit Type and Rh: 5100
Unit Type and Rh: 7300

## 2024-03-12 LAB — TYPE AND SCREEN
ABO/RH(D): B POS
Antibody Screen: NEGATIVE
Unit division: 0
Unit division: 0

## 2024-03-13 ENCOUNTER — Other Ambulatory Visit: Payer: Self-pay

## 2024-03-14 LAB — SAMPLE TO BLOOD BANK

## 2024-03-15 ENCOUNTER — Ambulatory Visit

## 2024-03-15 ENCOUNTER — Ambulatory Visit: Admitting: Oncology

## 2024-03-15 ENCOUNTER — Encounter: Payer: Self-pay | Admitting: Oncology

## 2024-03-15 ENCOUNTER — Other Ambulatory Visit

## 2024-03-15 ENCOUNTER — Encounter

## 2024-03-15 ENCOUNTER — Inpatient Hospital Stay (HOSPITAL_BASED_OUTPATIENT_CLINIC_OR_DEPARTMENT_OTHER): Admitting: Oncology

## 2024-03-15 ENCOUNTER — Inpatient Hospital Stay: Attending: Internal Medicine

## 2024-03-15 VITALS — BP 106/59 | HR 96 | Temp 96.7°F | Resp 16 | Wt 142.7 lb

## 2024-03-15 VITALS — BP 112/62

## 2024-03-15 DIAGNOSIS — F17210 Nicotine dependence, cigarettes, uncomplicated: Secondary | ICD-10-CM | POA: Diagnosis not present

## 2024-03-15 DIAGNOSIS — Z79620 Long term (current) use of immunosuppressive biologic: Secondary | ICD-10-CM | POA: Insufficient documentation

## 2024-03-15 DIAGNOSIS — C7951 Secondary malignant neoplasm of bone: Secondary | ICD-10-CM | POA: Diagnosis not present

## 2024-03-15 DIAGNOSIS — Z79899 Other long term (current) drug therapy: Secondary | ICD-10-CM | POA: Diagnosis not present

## 2024-03-15 DIAGNOSIS — C679 Malignant neoplasm of bladder, unspecified: Secondary | ICD-10-CM

## 2024-03-15 DIAGNOSIS — E871 Hypo-osmolality and hyponatremia: Secondary | ICD-10-CM | POA: Insufficient documentation

## 2024-03-15 DIAGNOSIS — F1090 Alcohol use, unspecified, uncomplicated: Secondary | ICD-10-CM | POA: Diagnosis not present

## 2024-03-15 DIAGNOSIS — D649 Anemia, unspecified: Secondary | ICD-10-CM | POA: Insufficient documentation

## 2024-03-15 DIAGNOSIS — C787 Secondary malignant neoplasm of liver and intrahepatic bile duct: Secondary | ICD-10-CM | POA: Insufficient documentation

## 2024-03-15 DIAGNOSIS — Z5112 Encounter for antineoplastic immunotherapy: Secondary | ICD-10-CM | POA: Diagnosis present

## 2024-03-15 DIAGNOSIS — E860 Dehydration: Secondary | ICD-10-CM | POA: Insufficient documentation

## 2024-03-15 LAB — CBC WITH DIFFERENTIAL (CANCER CENTER ONLY)
Abs Immature Granulocytes: 1.17 K/uL — ABNORMAL HIGH (ref 0.00–0.07)
Basophils Absolute: 0.1 K/uL (ref 0.0–0.1)
Basophils Relative: 1 %
Eosinophils Absolute: 0.1 K/uL (ref 0.0–0.5)
Eosinophils Relative: 1 %
HCT: 27.8 % — ABNORMAL LOW (ref 39.0–52.0)
Hemoglobin: 9.1 g/dL — ABNORMAL LOW (ref 13.0–17.0)
Immature Granulocytes: 14 %
Lymphocytes Relative: 16 %
Lymphs Abs: 1.3 K/uL (ref 0.7–4.0)
MCH: 30.3 pg (ref 26.0–34.0)
MCHC: 32.7 g/dL (ref 30.0–36.0)
MCV: 92.7 fL (ref 80.0–100.0)
Monocytes Absolute: 1.1 K/uL — ABNORMAL HIGH (ref 0.1–1.0)
Monocytes Relative: 14 %
Neutro Abs: 4.5 K/uL (ref 1.7–7.7)
Neutrophils Relative %: 54 %
Platelet Count: 41 K/uL — ABNORMAL LOW (ref 150–400)
RBC: 3 MIL/uL — ABNORMAL LOW (ref 4.22–5.81)
RDW: 16.9 % — ABNORMAL HIGH (ref 11.5–15.5)
WBC Count: 8.4 K/uL (ref 4.0–10.5)
nRBC: 2.3 % — ABNORMAL HIGH (ref 0.0–0.2)

## 2024-03-15 LAB — CMP (CANCER CENTER ONLY)
ALT: 12 U/L (ref 0–44)
AST: 26 U/L (ref 15–41)
Albumin: 2.8 g/dL — ABNORMAL LOW (ref 3.5–5.0)
Alkaline Phosphatase: 2921 U/L — ABNORMAL HIGH (ref 38–126)
Anion gap: 7 (ref 5–15)
BUN: 20 mg/dL (ref 8–23)
CO2: 20 mmol/L — ABNORMAL LOW (ref 22–32)
Calcium: 8.2 mg/dL — ABNORMAL LOW (ref 8.9–10.3)
Chloride: 107 mmol/L (ref 98–111)
Creatinine: 1.7 mg/dL — ABNORMAL HIGH (ref 0.61–1.24)
GFR, Estimated: 44 mL/min — ABNORMAL LOW (ref >60)
Glucose, Bld: 133 mg/dL — ABNORMAL HIGH (ref 70–99)
Potassium: 4 mmol/L (ref 3.5–5.1)
Sodium: 134 mmol/L — ABNORMAL LOW (ref 135–145)
Total Bilirubin: 0.7 mg/dL (ref 0.0–1.2)
Total Protein: 6 g/dL — ABNORMAL LOW (ref 6.5–8.1)

## 2024-03-15 LAB — SAMPLE TO BLOOD BANK

## 2024-03-15 MED ORDER — SODIUM CHLORIDE 0.9 % IV SOLN
INTRAVENOUS | Status: DC
  Filled 2024-03-15: qty 250

## 2024-03-15 MED ORDER — SODIUM CHLORIDE 0.9 % IV SOLN
200.0000 mg | Freq: Once | INTRAVENOUS | Status: AC
  Administered 2024-03-15: 200 mg via INTRAVENOUS
  Filled 2024-03-15: qty 8

## 2024-03-15 NOTE — Progress Notes (Signed)
 [facility]  Telephone:(336) [phone] Fax:(336) [phone]  ID: Ryan Dixon OB: September 02, 1955  MR#: [id number]  RDW#:[id number]  Patient Care Team: [name], MD as PCP - General (Family Medicine) [name], MD as Consulting Physician (Oncology)  CHIEF COMPLAINT: Stage IV high-grade urothelial carcinoma.  INTERVAL HISTORY: Patient returns to clinic today for repeat laboratory work, further evaluation, and reconsideration of treatment with Keytruda  only.  He has chronic weakness and fatigue, but otherwise feels well.  He does not complained of pain today.  He has no neurologic complaints.  He denies any recent fevers or illnesses.  He has no chest pain, shortness of breath, cough, or hemoptysis.  He denies any abdominal pain.  He denies any nausea, vomiting, constipation, or diarrhea.  He has no urinary complaints.  Patient offers no further specific complaints today.  REVIEW OF SYSTEMS:   Review of Systems  Constitutional:  Positive for malaise/fatigue and weight loss. Negative for fever.  HENT:  Negative for nosebleeds.   Respiratory: Negative.  Negative for cough, hemoptysis and shortness of breath.   Cardiovascular: Negative.  Negative for chest pain and leg swelling.  Gastrointestinal: Negative.  Negative for abdominal pain and melena.  Genitourinary: Negative.  Negative for dysuria.  Musculoskeletal: Negative.  Negative for back pain and joint pain.  Skin: Negative.  Negative for itching and rash.  Neurological:  Positive for weakness. Negative for dizziness, focal weakness and headaches.  Psychiatric/Behavioral: Negative.  The patient is not nervous/anxious.     As per HPI. Otherwise, a complete review of systems is negative.  PAST MEDICAL HISTORY: Past Medical History:  Diagnosis Date   Aortic atherosclerosis    Chronic foot pain, left    History of hematuria    Hypertension    Other emphysema (HCC)    Other male erectile dysfunction     Pre-diabetes    Pure hypercholesterolemia    Tobacco dependence     PAST SURGICAL HISTORY: Past Surgical History:  Procedure Laterality Date   ANKLE ARTHROSCOPY Left 08/11/2020   Procedure: LEFT ANKLE ARTHROSCOPY AND DEBRIDEMENT;  Surgeon: [name], MD;  Location: [facility];  Service: Orthopedics;  Laterality: Left;   BLADDER INSTILLATION N/A 11/29/2022   Procedure: BLADDER INSTILLATION OF GEMCITABINE ;  Surgeon: [name], MD;  Location: [facility];  Service: Urology;  Laterality: N/A;   COLONOSCOPY     CYSTOSCOPY WITH INJECTION N/A 07/07/2023   Procedure: CYSTOSCOPY WITH INJECTION;  Surgeon: [name], MD;  Location: [facility];  Service: Urology;  Laterality: N/A;   IR IMAGING GUIDED PORT INSERTION  01/20/2023   PELVIC LYMPH NODE DISSECTION N/A 07/07/2023   Procedure: PELVIC LYMPH NODE DISSECTION;  Surgeon: [name], MD;  Location: [facility];  Service: Urology;  Laterality: N/A;   ROBOT ASSISTED LAPAROSCOPIC COMPLETE CYSTECT ILEAL CONDUIT N/A 07/07/2023   Procedure: XI ROBOTIC ASSISTED LAPAROSCOPIC COMPLETE CYSTECT ILEAL CONDUIT, UMBILICAL HERNIA REPAIR, BILATERAL INGUINAL HERNIA REPAIR;  Surgeon: [name], MD;  Location: [facility];  Service: Urology;  Laterality: N/A;   ROBOT ASSISTED LAPAROSCOPIC RADICAL PROSTATECTOMY N/A 07/07/2023   Procedure: XI ROBOTIC ASSISTED LAPAROSCOPIC RADICAL PROSTATECTOMY;  Surgeon: [name], MD;  Location: [facility];  Service: Urology;  Laterality: N/A;   TONSILLECTOMY     TRANSURETHRAL RESECTION OF BLADDER TUMOR N/A 11/29/2022   Procedure: TRANSURETHRAL RESECTION OF BLADDER TUMOR (TURBT);  Surgeon: [name], MD;  Location: [facility];  Service: Urology;  Laterality:  N/A;    FAMILY HISTORY: History reviewed. No pertinent family history.  ADVANCED DIRECTIVES (Y/N):  N  HEALTH MAINTENANCE: Social History   Tobacco Use   Smoking status: Every Day    Current packs/day: 2.00    Average  packs/day: 2.0 packs/day for 43.0 years (86.0 ttl pk-yrs)    Types: Cigarettes   Smokeless tobacco: Never  Substance Use Topics   Alcohol use: Yes    Comment: 1-2 beers/day   Drug use: Never     Colonoscopy:  PAP:  Bone density:  Lipid panel:  Allergies  Allergen Reactions   Nsaids Other (See Comments)    GI upset   Other Other (See Comments)    Anti-inflammatories cause GI upset, N/V    Current Outpatient Medications  Medication Sig Dispense Refill   albuterol  (VENTOLIN  HFA) 108 (90 Base) MCG/ACT inhaler Inhale 2 puffs into the lungs every 6 (six) hours as needed for wheezing or shortness of breath. 8 g 2   atorvastatin  (LIPITOR) 20 MG tablet TAKE 1 TABLET BY MOUTH EVERY DAY AT NIGHT     Betamethasone  Dipropionate (SERNIVO ) 0.05 % EMUL Apply topically twice daily to itchy rash as needed. Avoid face, groin, axilla 120 mL 4   Carboxymethylcell-Glycerin  PF 0.5-0.9 % SOLN Place 2 drops into both eyes 4 (four) times daily. 1 each 11   lidocaine -prilocaine  (EMLA ) cream Apply to affected area once 30 g 3   mupirocin  ointment (BACTROBAN ) 2 % Apply topically to wound at right thigh twice daily and cover until healed 30 g 1   ondansetron  (ZOFRAN ) 8 MG tablet Take 1 tablet (8 mg total) by mouth every 8 (eight) hours as needed for nausea or vomiting. 60 tablet 1   oxyCODONE  (ROXICODONE ) 5 MG immediate release tablet Take 1 tablet (5 mg total) by mouth every 6 (six) hours as needed for moderate pain (pain score 4-6) or severe pain (pain score 7-10) (post-operatively). 15 tablet 0   prochlorperazine  (COMPAZINE ) 10 MG tablet Take 1 tablet (10 mg total) by mouth every 6 (six) hours as needed for nausea or vomiting. 60 tablet 1   senna-docusate (SENOKOT-S) 8.6-50 MG tablet Take 1 tablet by mouth 2 (two) times daily. While taking strong pain meds to prevent constipation. 10 tablet 0   traMADol  (ULTRAM ) 50 MG tablet Take 1 tablet (50 mg total) by mouth every 6 (six) hours as needed. 90 tablet 1    triamcinolone  ointment (KENALOG ) 0.5 % Apply 1 Application topically 2 (two) times daily. 30 g 0   varenicline  (CHANTIX ) 1 MG tablet Take 1 mg by mouth 2 (two) times daily.     Wheat Dextrin (BENEFIBER) POWD Take 0.5 Scoops by mouth daily.     hydrochlorothiazide (HYDRODIURIL) 25 MG tablet Take 25 mg by mouth daily. (Patient not taking: Reported on 03/15/2024)     No current facility-administered medications for this visit.   Facility-Administered Medications Ordered in Other Visits  Medication Dose Route Frequency Provider Last Rate Last Admin   0.9 %  sodium chloride  infusion   Intravenous Continuous Sarajane Fambrough J, MD   Stopped at 03/15/24 1146   heparin  lock flush 100 unit/mL  500 Units Intravenous Once Agrawal, Kavita, MD        OBJECTIVE: Vitals:   03/15/24 0847  BP: (!) 106/59  Pulse: 96  Resp: 16  Temp: (!) 96.7 F (35.9 C)  SpO2: 99%       Body mass index is 20.48 kg/m.    ECOG FS:0 - Asymptomatic  General: Well-developed, well-nourished, no acute distress. Eyes: Pink conjunctiva, anicteric sclera. HEENT: Normocephalic, moist mucous membranes. Lungs: No audible wheezing or coughing. Heart: Regular rate and rhythm. Abdomen: Soft, nontender, no obvious distention. Musculoskeletal: No edema, cyanosis, or clubbing. Neuro: Alert, answering all questions appropriately. Cranial nerves grossly intact. Skin: No rashes or petechiae noted. Psych: Normal affect.  LAB RESULTS:  Lab Results  Component Value Date   NA 134 (L) 03/15/2024   K 4.0 03/15/2024   CL 107 03/15/2024   CO2 20 (L) 03/15/2024   GLUCOSE 133 (H) 03/15/2024   BUN 20 03/15/2024   CREATININE 1.70 (H) 03/15/2024   CALCIUM  8.2 (L) 03/15/2024   PROT 6.0 (L) 03/15/2024   ALBUMIN  2.8 (L) 03/15/2024   AST 26 03/15/2024   ALT 12 03/15/2024   ALKPHOS 2,921 (H) 03/15/2024   BILITOT 0.7 03/15/2024   GFRNONAA 44 (L) 03/15/2024    Lab Results  Component Value Date   WBC 8.4 03/15/2024   NEUTROABS 4.5  03/15/2024   HGB 9.1 (L) 03/15/2024   HCT 27.8 (L) 03/15/2024   MCV 92.7 03/15/2024   PLT 41 (L) 03/15/2024     STUDIES: No results found.   ASSESSMENT: Stage IV high-grade urothelial carcinoma.  PLAN:    Stage IV high-grade urothelial carcinoma: Patient was initially stage IIIb and received neoadjuvant chemotherapy with dose dense MVAC completing treatment on April 05, 2023.  He subsequently underwent cystoprostatectomy on July 07, 2023.  Patient was noted to have residual disease and 2 of 14 lymph nodes positive for malignancy.  He started on adjuvant nivolumab  with plans to do 1 year of maintenance treatment, but developed grade 3 rash.  Per dermatology, rash was related to an exacerbation of psoriasis and not a reaction to his immunotherapy.  Rash resolved with topical treatment and systemic steroids.  Despite this, patient elected to not to reinitiate maintenance treatment.  Imaging on Oct 23, 2023 with no obvious evidence of recurrent or progressive disease.  Repeat imaging on January 26, 2024 revealed new widespread metastatic disease involving liver and bone.  Initial plan was to treat with pembrolizumab  and enfortumab vedotin  on day 1 with Enfortumab only on day 8.  This will be a 21-day cycle.  But given patient's persistent thrombocytopenia patient will receive Keytruda  only until his platelet count recovers.  Proceed with Keytruda  today.  Return to clinic in 1 week for repeat laboratory work and further evaluation.   Psoriasis exacerbation: Case discussed with dermatology.  Dermatology intends to hold initiating Sotyku until blood counts improved. 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  Continue follow-up with per ID.   Hyponatremia: Mild, monitor.  Patient sodium level is 134. Renal insufficiency: Patient developed platinum induced nephrotoxicity after the completion of cycle 4 of his dose dense MVAC.  Creatinine improved to 1.70.  Neither  Enfortumab or pembrolizumab  need to be dosed reduced in renal insufficiency.   Transaminitis: Resolved.  Neither Enfortumab or pembrolizumab  need to be dose reduced with hepatic insufficiency with a normal bilirubin.   Thrombocytopenia: Lowly improving.  Patient's platelet count is 41.  Proceed with Keytruda  only as above. Nosebleeds: Patient does not complain of this today.  Likely related to thrombocytopenia.  Monitor. Anemia: Hemoglobin improved to 9.1 with transfusion.  Suspect this is a combination of chemotherapy, thrombocytopenia and possible bleeding from gastritis given the amount of Advil patient was taking.  He has been instructed to discontinue Advil altogether.  He does not require transfusion today.  Rash: Patient does not complain of this today.  Monitor while on immunotherapy. Dark tarry stools: Likely secondary to Advil intake and thrombocytopenia.  Monitor. Pain: Patient has been instructed to take Tylenol  and tramadol .  Discontinue Advil altogether.  Patient expressed understanding and was in agreement with this plan. He also understands that He can call clinic at any time with any questions, concerns, or complaints.    Cancer Staging  Bladder cancer [facility]) Staging form: Urinary Bladder, AJCC 8th Edition - Clinical stage from 12/07/2022: Stage II (cT2, cN0, cM0) - Signed by Agrawal, Kavita, MD on 01/02/2023 Stage prefix: Initial diagnosis WHO/ISUP grade (low/high): High Grade Histologic grading system: 2 grade system - Pathologic stage from 07/07/2023: Stage IIIB (ypT2a, ypN2, cM0) - Signed by Agrawal, Kavita, MD on 08/31/2023 Stage prefix: Post-therapy Response to neoadjuvant therapy: Partial response WHO/ISUP grade (low/high): High Grade Histologic grading system: 2 grade system   Evalene JINNY Reusing, MD   03/15/2024 1:17 PM

## 2024-03-15 NOTE — Patient Instructions (Signed)
 [facility] - A DEPT OF [facility]  Discharge Instructions: Thank you for choosing [facility] to provide your oncology and hematology care.  If you have a lab appointment with the Cancer Center, please go directly to the Cancer Center and check in at the registration area.  Wear comfortable clothing and clothing appropriate for easy access to any Portacath or PICC line.   We strive to give you quality time with your provider. You may need to reschedule your appointment if you arrive late (15 or more minutes).  Arriving late affects you and other patients whose appointments are after yours.  Also, if you miss three or more appointments without notifying the office, you may be dismissed from the clinic at the provider's discretion.      For prescription refill requests, have your pharmacy contact our office and allow 72 hours for refills to be completed.    Today you received the following chemotherapy and/or immunotherapy agents KEYTRUDA      To help prevent nausea and vomiting after your treatment, we encourage you to take your nausea medication as directed.  BELOW ARE SYMPTOMS THAT SHOULD BE REPORTED IMMEDIATELY: *FEVER GREATER THAN 100.4 F (38 C) OR HIGHER *CHILLS OR SWEATING *NAUSEA AND VOMITING THAT IS NOT CONTROLLED WITH YOUR NAUSEA MEDICATION *UNUSUAL SHORTNESS OF BREATH *UNUSUAL BRUISING OR BLEEDING *URINARY PROBLEMS (pain or burning when urinating, or frequent urination) *BOWEL PROBLEMS (unusual diarrhea, constipation, pain near the anus) TENDERNESS IN MOUTH AND THROAT WITH OR WITHOUT PRESENCE OF ULCERS (sore throat, sores in mouth, or a toothache) UNUSUAL RASH, SWELLING OR PAIN  UNUSUAL VAGINAL DISCHARGE OR ITCHING   Items with * indicate a potential emergency and should be followed up as soon as possible or go to the Emergency Department if any problems should occur.  Please show the CHEMOTHERAPY ALERT CARD or IMMUNOTHERAPY  ALERT CARD at check-in to the Emergency Department and triage nurse.  Should you have questions after your visit or need to cancel or reschedule your appointment, please contact [facility] - A DEPT OF [facility]  [phone] and follow the prompts.  Office hours are 8:00 a.m. to 4:30 p.m. Monday - Friday. Please note that voicemails left after 4:00 p.m. may not be returned until the following business day.  We are closed weekends and major holidays. You have access to a nurse at all times for urgent questions. Please call the main number to the clinic [phone] and follow the prompts.  For any non-urgent questions, you may also contact your provider using MyChart. We now offer e-Visits for anyone 71 and older to request care online for non-urgent symptoms. For details visit [email or web address].   Also download the MyChart app! Go to the app store, search "MyChart", open the app, select [place], and log in with your MyChart username and password.   Pembrolizumab Injection What is this medication? PEMBROLIZUMAB (PEM broe LIZ ue mab) treats some types of cancer. It works by helping your immune system slow or stop the spread of cancer cells. It is a monoclonal antibody. This medicine may be used for other purposes; ask your health care provider or pharmacist if you have questions. COMMON BRAND NAME(S): Keytruda What should I tell my care team before I take this medication? They need to know if you have any of these conditions: Allogeneic stem cell transplant (uses someone else's stem cells) Autoimmune diseases, such as Crohn  disease, ulcerative colitis, lupus History of chest radiation Nervous system problems, such as Guillain-Barre syndrome, myasthenia gravis Organ transplant An unusual or allergic reaction to pembrolizumab, other medications, foods, dyes, or preservatives Pregnant or trying to get pregnant Breast-feeding How should I use this  medication? This medication is injected into a vein. It is given by your care team in a hospital or clinic setting. A special MedGuide will be given to you before each treatment. Be sure to read this information carefully each time. Talk to your care team about the use of this medication in children. While it may be prescribed for children as young as 6 months for selected conditions, precautions do apply. Overdosage: If you think you have taken too much of this medicine contact a poison control center or emergency room at once. NOTE: This medicine is only for you. Do not share this medicine with others. What if I miss a dose? Keep appointments for follow-up doses. It is important not to miss your dose. Call your care team if you are unable to keep an appointment. What may interact with this medication? Interactions have not been studied. This list may not describe all possible interactions. Give your health care provider a list of all the medicines, herbs, non-prescription drugs, or dietary supplements you use. Also tell them if you smoke, drink alcohol, or use illegal drugs. Some items may interact with your medicine. What should I watch for while using this medication? Your condition will be monitored carefully while you are receiving this medication. You may need blood work while taking this medication. This medication may cause serious skin reactions. They can happen weeks to months after starting the medication. Contact your care team right away if you notice fevers or flu-like symptoms with a rash. The rash may be red or purple and then turn into blisters or peeling of the skin. You may also notice a red rash with swelling of the face, lips, or lymph nodes in your neck or under your arms. Tell your care team right away if you have any change in your eyesight. Talk to your care team if you may be pregnant. Serious birth defects can occur if you take this medication during pregnancy and for 4  months after the last dose. You will need a negative pregnancy test before starting this medication. Contraception is recommended while taking this medication and for 4 months after the last dose. Your care team can help you find the option that works for you. Do not breastfeed while taking this medication and for 4 months after the last dose. What side effects may I notice from receiving this medication? Side effects that you should report to your care team as soon as possible: Allergic reactions--skin rash, itching, hives, swelling of the face, lips, tongue, or throat Dry cough, shortness of breath or trouble breathing Eye pain, redness, irritation, or discharge with blurry or decreased vision Heart muscle inflammation--unusual weakness or fatigue, shortness of breath, chest pain, fast or irregular heartbeat, dizziness, swelling of the ankles, feet, or hands Hormone gland problems--headache, sensitivity to light, unusual weakness or fatigue, dizziness, fast or irregular heartbeat, increased sensitivity to cold or heat, excessive sweating, constipation, hair loss, increased thirst or amount of urine, tremors or shaking, irritability Infusion reactions--chest pain, shortness of breath or trouble breathing, feeling faint or lightheaded Kidney injury (glomerulonephritis)--decrease in the amount of urine, red or dark brown urine, foamy or bubbly urine, swelling of the ankles, hands, or feet Liver injury--right upper belly pain,  loss of appetite, nausea, light-colored stool, dark yellow or brown urine, yellowing skin or eyes, unusual weakness or fatigue Pain, tingling, or numbness in the hands or feet, muscle weakness, change in vision, confusion or trouble speaking, loss of balance or coordination, trouble walking, seizures Rash, fever, and swollen lymph nodes Redness, blistering, peeling, or loosening of the skin, including inside the mouth Sudden or severe stomach pain, bloody diarrhea, fever, nausea,  vomiting Side effects that usually do not require medical attention (report to your care team if they continue or are bothersome): Bone, joint, or muscle pain Diarrhea Fatigue Loss of appetite Nausea Skin rash This list may not describe all possible side effects. Call your doctor for medical advice about side effects. You may report side effects to FDA at 1-800-FDA-1088. Where should I keep my medication? This medication is given in a hospital or clinic. It will not be stored at home. NOTE: This sheet is a summary. It may not cover all possible information. If you have questions about this medicine, talk to your doctor, pharmacist, or health care provider.  2024 Elsevier/Gold Standard (2021-10-12 00:00:00)

## 2024-03-18 ENCOUNTER — Encounter

## 2024-03-21 ENCOUNTER — Other Ambulatory Visit: Payer: Self-pay | Admitting: *Deleted

## 2024-03-21 DIAGNOSIS — D649 Anemia, unspecified: Secondary | ICD-10-CM

## 2024-03-22 ENCOUNTER — Inpatient Hospital Stay

## 2024-03-22 ENCOUNTER — Encounter: Payer: Self-pay | Admitting: Oncology

## 2024-03-22 ENCOUNTER — Other Ambulatory Visit

## 2024-03-22 ENCOUNTER — Inpatient Hospital Stay (HOSPITAL_BASED_OUTPATIENT_CLINIC_OR_DEPARTMENT_OTHER): Admitting: Oncology

## 2024-03-22 ENCOUNTER — Ambulatory Visit

## 2024-03-22 ENCOUNTER — Ambulatory Visit: Admitting: Oncology

## 2024-03-22 VITALS — BP 105/65 | HR 105 | Temp 97.3°F | Resp 18 | Ht 70.0 in | Wt 137.0 lb

## 2024-03-22 DIAGNOSIS — C679 Malignant neoplasm of bladder, unspecified: Secondary | ICD-10-CM

## 2024-03-22 DIAGNOSIS — Z5112 Encounter for antineoplastic immunotherapy: Secondary | ICD-10-CM | POA: Diagnosis not present

## 2024-03-22 DIAGNOSIS — D649 Anemia, unspecified: Secondary | ICD-10-CM

## 2024-03-22 LAB — CBC WITH DIFFERENTIAL (CANCER CENTER ONLY)
Abs Immature Granulocytes: 1.45 K/uL — ABNORMAL HIGH (ref 0.00–0.07)
Basophils Absolute: 0.1 K/uL (ref 0.0–0.1)
Basophils Relative: 1 %
Eosinophils Absolute: 0.1 K/uL (ref 0.0–0.5)
Eosinophils Relative: 1 %
HCT: 28.4 % — ABNORMAL LOW (ref 39.0–52.0)
Hemoglobin: 9.3 g/dL — ABNORMAL LOW (ref 13.0–17.0)
Immature Granulocytes: 15 %
Lymphocytes Relative: 13 %
Lymphs Abs: 1.2 K/uL (ref 0.7–4.0)
MCH: 30.3 pg (ref 26.0–34.0)
MCHC: 32.7 g/dL (ref 30.0–36.0)
MCV: 92.5 fL (ref 80.0–100.0)
Monocytes Absolute: 1.2 K/uL — ABNORMAL HIGH (ref 0.1–1.0)
Monocytes Relative: 12 %
Neutro Abs: 5.5 K/uL (ref 1.7–7.7)
Neutrophils Relative %: 58 %
Platelet Count: 72 K/uL — ABNORMAL LOW (ref 150–400)
RBC: 3.07 MIL/uL — ABNORMAL LOW (ref 4.22–5.81)
RDW: 16.4 % — ABNORMAL HIGH (ref 11.5–15.5)
WBC Count: 9.4 K/uL (ref 4.0–10.5)
nRBC: 0.7 % — ABNORMAL HIGH (ref 0.0–0.2)

## 2024-03-22 LAB — CMP (CANCER CENTER ONLY)
ALT: 12 U/L (ref 0–44)
AST: 30 U/L (ref 15–41)
Albumin: 2.8 g/dL — ABNORMAL LOW (ref 3.5–5.0)
Alkaline Phosphatase: 1302 U/L — ABNORMAL HIGH (ref 38–126)
Anion gap: 8 (ref 5–15)
BUN: 21 mg/dL (ref 8–23)
CO2: 22 mmol/L (ref 22–32)
Calcium: 8.2 mg/dL — ABNORMAL LOW (ref 8.9–10.3)
Chloride: 103 mmol/L (ref 98–111)
Creatinine: 1.59 mg/dL — ABNORMAL HIGH (ref 0.61–1.24)
GFR, Estimated: 48 mL/min — ABNORMAL LOW (ref >60)
Glucose, Bld: 132 mg/dL — ABNORMAL HIGH (ref 70–99)
Potassium: 4.3 mmol/L (ref 3.5–5.1)
Sodium: 133 mmol/L — ABNORMAL LOW (ref 135–145)
Total Bilirubin: 0.7 mg/dL (ref 0.0–1.2)
Total Protein: 6.4 g/dL — ABNORMAL LOW (ref 6.5–8.1)

## 2024-03-22 NOTE — Patient Instructions (Signed)

## 2024-03-22 NOTE — Progress Notes (Signed)
 Patient needs a refill on his tramadol , which he says that it does work, he is usually taking one in the morning, one at lunch and one at night before bed.

## 2024-03-22 NOTE — Progress Notes (Signed)
 [facility]  Telephone:(336) [phone] Fax:(336) [phone]  ID: Ryan Dixon OB: 03/01/1956  MR#: [id number]  RDW#:[id number]  Patient Care Team: [name], MD as PCP - General (Family Medicine) Jacobo Ryan PARAS, MD as Consulting Physician (Oncology)  CHIEF COMPLAINT: Stage IV high-grade urothelial carcinoma.  INTERVAL HISTORY: Patient returns to clinic today for repeat laboratory and further evaluation.  He continues to have chronic weakness and fatigue, but otherwise feels well.  He does not complained of pain today.  He has no neurologic complaints.  He denies any recent fevers or illnesses.  He has no chest pain, shortness of breath, cough, or hemoptysis.  He denies any abdominal pain.  He denies any nausea, vomiting, constipation, or diarrhea.  He has no urinary complaints.  Patient offers no further specific complaints today.  REVIEW OF SYSTEMS:   Review of Systems  Constitutional:  Positive for malaise/fatigue and weight loss. Negative for fever.  HENT:  Negative for nosebleeds.   Respiratory: Negative.  Negative for cough, hemoptysis and shortness of breath.   Cardiovascular: Negative.  Negative for chest pain and leg swelling.  Gastrointestinal: Negative.  Negative for abdominal pain and melena.  Genitourinary: Negative.  Negative for dysuria.  Musculoskeletal: Negative.  Negative for back pain and joint pain.  Skin: Negative.  Negative for itching and rash.  Neurological:  Positive for weakness. Negative for dizziness, focal weakness and headaches.  Psychiatric/Behavioral: Negative.  The patient is not nervous/anxious.     As per HPI. Otherwise, a complete review of systems is negative.  PAST MEDICAL HISTORY: Past Medical History:  Diagnosis Date   Aortic atherosclerosis    Chronic foot pain, left    History of hematuria    Hypertension    Other emphysema (HCC)    Other male erectile dysfunction    Pre-diabetes    Pure hypercholesterolemia     Tobacco dependence     PAST SURGICAL HISTORY: Past Surgical History:  Procedure Laterality Date   ANKLE ARTHROSCOPY Left 08/11/2020   Procedure: LEFT ANKLE ARTHROSCOPY AND DEBRIDEMENT;  Surgeon: [name], MD;  Location: [facility];  Service: Orthopedics;  Laterality: Left;   BLADDER INSTILLATION N/A 11/29/2022   Procedure: BLADDER INSTILLATION OF GEMCITABINE ;  Surgeon: [name], MD;  Location: [facility];  Service: Urology;  Laterality: N/A;   COLONOSCOPY     CYSTOSCOPY WITH INJECTION N/A 07/07/2023   Procedure: CYSTOSCOPY WITH INJECTION;  Surgeon: [name], MD;  Location: [facility];  Service: Urology;  Laterality: N/A;   IR IMAGING GUIDED PORT INSERTION  01/20/2023   PELVIC LYMPH NODE DISSECTION N/A 07/07/2023   Procedure: PELVIC LYMPH NODE DISSECTION;  Surgeon: [name], MD;  Location: [facility];  Service: Urology;  Laterality: N/A;   ROBOT ASSISTED LAPAROSCOPIC COMPLETE CYSTECT ILEAL CONDUIT N/A 07/07/2023   Procedure: XI ROBOTIC ASSISTED LAPAROSCOPIC COMPLETE CYSTECT ILEAL CONDUIT, UMBILICAL HERNIA REPAIR, BILATERAL INGUINAL HERNIA REPAIR;  Surgeon: [name], MD;  Location: [facility];  Service: Urology;  Laterality: N/A;   ROBOT ASSISTED LAPAROSCOPIC RADICAL PROSTATECTOMY N/A 07/07/2023   Procedure: XI ROBOTIC ASSISTED LAPAROSCOPIC RADICAL PROSTATECTOMY;  Surgeon: [name], MD;  Location: [facility];  Service: Urology;  Laterality: N/A;   TONSILLECTOMY     TRANSURETHRAL RESECTION OF BLADDER TUMOR N/A 11/29/2022   Procedure: TRANSURETHRAL RESECTION OF BLADDER TUMOR (TURBT);  Surgeon: [name], MD;  Location: [facility];  Service: Urology;  Laterality: N/A;    FAMILY  HISTORY: History reviewed. No pertinent family history.  ADVANCED DIRECTIVES (Y/N):  N  HEALTH MAINTENANCE: Social History   Tobacco Use   Smoking status: Every Day    Current packs/day: 2.00    Average packs/day: 2.0 packs/day for 43.0 years (86.0  ttl pk-yrs)    Types: Cigarettes   Smokeless tobacco: Never  Substance Use Topics   Alcohol use: Yes    Comment: 1-2 beers/day   Drug use: Never     Colonoscopy:  PAP:  Bone density:  Lipid panel:  Allergies  Allergen Reactions   Nsaids Other (See Comments)    GI upset   Other Other (See Comments)    Anti-inflammatories cause GI upset, N/V    Current Outpatient Medications  Medication Sig Dispense Refill   albuterol  (VENTOLIN  HFA) 108 (90 Base) MCG/ACT inhaler Inhale 2 puffs into the lungs every 6 (six) hours as needed for wheezing or shortness of breath. 8 g 2   atorvastatin  (LIPITOR) 20 MG tablet TAKE 1 TABLET BY MOUTH EVERY DAY AT NIGHT     Betamethasone  Dipropionate (SERNIVO ) 0.05 % EMUL Apply topically twice daily to itchy rash as needed. Avoid face, groin, axilla 120 mL 4   Carboxymethylcell-Glycerin  PF 0.5-0.9 % SOLN Place 2 drops into both eyes 4 (four) times daily. 1 each 11   hydrochlorothiazide (HYDRODIURIL) 25 MG tablet Take 25 mg by mouth daily.     lidocaine -prilocaine  (EMLA ) cream Apply to affected area once 30 g 3   mupirocin  ointment (BACTROBAN ) 2 % Apply topically to wound at right thigh twice daily and cover until healed 30 g 1   ondansetron  (ZOFRAN ) 8 MG tablet Take 1 tablet (8 mg total) by mouth every 8 (eight) hours as needed for nausea or vomiting. 60 tablet 1   prochlorperazine  (COMPAZINE ) 10 MG tablet Take 1 tablet (10 mg total) by mouth every 6 (six) hours as needed for nausea or vomiting. 60 tablet 1   senna-docusate (SENOKOT-S) 8.6-50 MG tablet Take 1 tablet by mouth 2 (two) times daily. While taking strong pain meds to prevent constipation. 10 tablet 0   traMADol  (ULTRAM ) 50 MG tablet Take 1 tablet (50 mg total) by mouth every 6 (six) hours as needed. 90 tablet 1   triamcinolone  ointment (KENALOG ) 0.5 % Apply 1 Application topically 2 (two) times daily. 30 g 0   varenicline  (CHANTIX ) 1 MG tablet Take 1 mg by mouth 2 (two) times daily.     Wheat  Dextrin (BENEFIBER) POWD Take 0.5 Scoops by mouth daily.     oxyCODONE  (ROXICODONE ) 5 MG immediate release tablet Take 1 tablet (5 mg total) by mouth every 6 (six) hours as needed for moderate pain (pain score 4-6) or severe pain (pain score 7-10) (post-operatively). (Patient not taking: Reported on 03/22/2024) 15 tablet 0   No current facility-administered medications for this visit.   Facility-Administered Medications Ordered in Other Visits  Medication Dose Route Frequency Provider Last Rate Last Admin   heparin  lock flush 100 unit/mL  500 Units Intravenous Once Agrawal, Kavita, MD        OBJECTIVE: Vitals:   03/22/24 0917  BP: 105/65  Pulse: (!) 105  Resp: 18  Temp: (!) 97.3 F (36.3 C)  SpO2: 100%      Body mass index is 19.66 kg/m.    ECOG FS:0 - Asymptomatic  General: Well-developed, well-nourished, no acute distress. Eyes: Pink conjunctiva, anicteric sclera. HEENT: Normocephalic, moist mucous membranes. Lungs: No audible wheezing or coughing. Heart: Regular rate and rhythm.  Abdomen: Soft, nontender, no obvious distention. Musculoskeletal: No edema, cyanosis, or clubbing. Neuro: Alert, answering all questions appropriately. Cranial nerves grossly intact. Skin: No rashes or petechiae noted. Psych: Normal affect.  LAB RESULTS:  Lab Results  Component Value Date   NA 133 (L) 03/22/2024   K 4.3 03/22/2024   CL 103 03/22/2024   CO2 22 03/22/2024   GLUCOSE 132 (H) 03/22/2024   BUN 21 03/22/2024   CREATININE 1.59 (H) 03/22/2024   CALCIUM  8.2 (L) 03/22/2024   PROT 6.4 (L) 03/22/2024   ALBUMIN  2.8 (L) 03/22/2024   AST 30 03/22/2024   ALT 12 03/22/2024   ALKPHOS 1,302 (H) 03/22/2024   BILITOT 0.7 03/22/2024   GFRNONAA 48 (L) 03/22/2024    Lab Results  Component Value Date   WBC 9.4 03/22/2024   NEUTROABS 5.5 03/22/2024   HGB 9.3 (L) 03/22/2024   HCT 28.4 (L) 03/22/2024   MCV 92.5 03/22/2024   PLT 72 (L) 03/22/2024     STUDIES: No results  found.   ASSESSMENT: Stage IV high-grade urothelial carcinoma.  PLAN:    Stage IV high-grade urothelial carcinoma: Patient was initially stage IIIb and received neoadjuvant chemotherapy with dose dense MVAC completing treatment on April 05, 2023.  He subsequently underwent cystoprostatectomy on July 07, 2023.  Patient was noted to have residual disease and 2 of 14 lymph nodes positive for malignancy.  He started on adjuvant nivolumab  with plans to do 1 year of maintenance treatment, but developed grade 3 rash.  Per dermatology, rash was related to an exacerbation of psoriasis and not a reaction to his immunotherapy.  Rash resolved with topical treatment and systemic steroids.  Despite this, patient elected to not to reinitiate maintenance treatment.  Imaging on Oct 23, 2023 with no obvious evidence of recurrent or progressive disease.  Repeat imaging on January 26, 2024 revealed new widespread metastatic disease involving liver and bone.  Initial plan was to treat with pembrolizumab  and enfortumab vedotin  on day 1 with Enfortumab only on day 8.  This will be a 21-day cycle.  But given patient's persistent thrombocytopenia patient will receive Keytruda  only until his platelet count recovers.  No treatment today.  Return to clinic in 2 weeks for further evaluation and consideration of cycle 3 with Keytruda  plus or minus Enfortumab depending on platelet count.   Psoriasis exacerbation: Case discussed with dermatology.  Dermatology intends to hold initiating Sotyku until blood counts improved. 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  Continue follow-up with per ID.   Hyponatremia: Chronic and unchanged.  Patient's sodium level is 133.SABRA Renal insufficiency: Patient developed platinum induced nephrotoxicity after the completion of cycle 4 of his dose dense MVAC.  Creatinine continues to trend down and is now 1.59.  Neither Enfortumab or pembrolizumab  need to be dosed reduced  in renal insufficiency.   Transaminitis: Resolved.  Neither Enfortumab or pembrolizumab  need to be dose reduced with hepatic insufficiency with a normal bilirubin.   Thrombocytopenia: Lowly improving.  Patient's platelet count is 41.  Proceed with Keytruda  only as above. Nosebleeds: Patient does not complain of this today.  Likely related to thrombocytopenia.  Monitor. Anemia: Hemoglobin stable at 9.3.  Suspect this is a combination of chemotherapy, thrombocytopenia and possible bleeding from gastritis given the amount of Advil patient was previously taking.  He has been instructed to discontinue Advil altogether.  He does not require transfusion today. Thrombocytopenia: Platelets slowly trending up and now are 72. Rash: Patient does not  complain of this today.  Monitor while on immunotherapy. Dark tarry stools: Likely secondary to Advil intake and thrombocytopenia.  Monitor. Pain: Patient has been instructed to take Tylenol  and tramadol .  Discontinue Advil altogether.  Patient expressed understanding and was in agreement with this plan. He also understands that He can call clinic at any time with any questions, concerns, or complaints.    Cancer Staging  Bladder cancer [facility]) Staging form: Urinary Bladder, AJCC 8th Edition - Clinical stage from 12/07/2022: Stage II (cT2, cN0, cM0) - Signed by Agrawal, Kavita, MD on 01/02/2023 Stage prefix: Initial diagnosis WHO/ISUP grade (low/high): High Grade Histologic grading system: 2 grade system - Pathologic stage from 07/07/2023: Stage IIIB (ypT2a, ypN2, cM0) - Signed by Agrawal, Kavita, MD on 08/31/2023 Stage prefix: Post-therapy Response to neoadjuvant therapy: Partial response WHO/ISUP grade (low/high): High Grade Histologic grading system: 2 grade system   Ryan JINNY Reusing, MD   03/22/2024 3:11 PM

## 2024-03-22 NOTE — Progress Notes (Signed)
 Nutrition  RD scheduled to see patient in infusion.  No infusion today.  Will reschedule to 10/24  Dahna Hattabaugh B. Dasie SOLON, CSO, LDN Registered Dietitian [phone]

## 2024-03-23 LAB — SAMPLE TO BLOOD BANK

## 2024-03-29 ENCOUNTER — Other Ambulatory Visit: Payer: Self-pay

## 2024-03-29 ENCOUNTER — Ambulatory Visit

## 2024-03-29 ENCOUNTER — Encounter: Payer: Self-pay | Admitting: Oncology

## 2024-03-29 ENCOUNTER — Ambulatory Visit: Admitting: Oncology

## 2024-03-29 ENCOUNTER — Other Ambulatory Visit

## 2024-04-02 ENCOUNTER — Encounter: Payer: Self-pay | Admitting: Oncology

## 2024-04-05 ENCOUNTER — Inpatient Hospital Stay

## 2024-04-05 ENCOUNTER — Inpatient Hospital Stay (HOSPITAL_BASED_OUTPATIENT_CLINIC_OR_DEPARTMENT_OTHER): Admitting: Oncology

## 2024-04-05 ENCOUNTER — Ambulatory Visit

## 2024-04-05 ENCOUNTER — Ambulatory Visit: Admitting: Oncology

## 2024-04-05 ENCOUNTER — Other Ambulatory Visit

## 2024-04-05 ENCOUNTER — Encounter: Payer: Self-pay | Admitting: Oncology

## 2024-04-05 VITALS — BP 112/64 | HR 102

## 2024-04-05 VITALS — BP 103/70 | HR 85 | Temp 98.4°F | Resp 18 | Ht 70.0 in | Wt 126.0 lb

## 2024-04-05 DIAGNOSIS — C679 Malignant neoplasm of bladder, unspecified: Secondary | ICD-10-CM | POA: Diagnosis not present

## 2024-04-05 DIAGNOSIS — Z5112 Encounter for antineoplastic immunotherapy: Secondary | ICD-10-CM | POA: Diagnosis not present

## 2024-04-05 DIAGNOSIS — E860 Dehydration: Secondary | ICD-10-CM

## 2024-04-05 DIAGNOSIS — N179 Acute kidney failure, unspecified: Secondary | ICD-10-CM

## 2024-04-05 LAB — CMP (CANCER CENTER ONLY)
ALT: 27 U/L (ref 0–44)
AST: 67 U/L — ABNORMAL HIGH (ref 15–41)
Albumin: 2.4 g/dL — ABNORMAL LOW (ref 3.5–5.0)
Alkaline Phosphatase: 2148 U/L — ABNORMAL HIGH (ref 38–126)
Anion gap: 14 (ref 5–15)
BUN: 41 mg/dL — ABNORMAL HIGH (ref 8–23)
CO2: 17 mmol/L — ABNORMAL LOW (ref 22–32)
Calcium: 8.2 mg/dL — ABNORMAL LOW (ref 8.9–10.3)
Chloride: 101 mmol/L (ref 98–111)
Creatinine: 2.27 mg/dL — ABNORMAL HIGH (ref 0.61–1.24)
GFR, Estimated: 31 mL/min — ABNORMAL LOW (ref >60)
Glucose, Bld: 148 mg/dL — ABNORMAL HIGH (ref 70–99)
Potassium: 4.5 mmol/L (ref 3.5–5.1)
Sodium: 132 mmol/L — ABNORMAL LOW (ref 135–145)
Total Bilirubin: 0.8 mg/dL (ref 0.0–1.2)
Total Protein: 6.5 g/dL (ref 6.5–8.1)

## 2024-04-05 LAB — CBC WITH DIFFERENTIAL (CANCER CENTER ONLY)
Abs Immature Granulocytes: 2.37 K/uL — ABNORMAL HIGH (ref 0.00–0.07)
Basophils Absolute: 0.1 K/uL (ref 0.0–0.1)
Basophils Relative: 1 %
Eosinophils Absolute: 0.1 K/uL (ref 0.0–0.5)
Eosinophils Relative: 1 %
HCT: 27.2 % — ABNORMAL LOW (ref 39.0–52.0)
Hemoglobin: 8.6 g/dL — ABNORMAL LOW (ref 13.0–17.0)
Immature Granulocytes: 17 %
Lymphocytes Relative: 9 %
Lymphs Abs: 1.2 K/uL (ref 0.7–4.0)
MCH: 29.6 pg (ref 26.0–34.0)
MCHC: 31.6 g/dL (ref 30.0–36.0)
MCV: 93.5 fL (ref 80.0–100.0)
Monocytes Absolute: 0.9 K/uL (ref 0.1–1.0)
Monocytes Relative: 7 %
Neutro Abs: 9.2 K/uL — ABNORMAL HIGH (ref 1.7–7.7)
Neutrophils Relative %: 65 %
Platelet Count: 52 K/uL — ABNORMAL LOW (ref 150–400)
RBC: 2.91 MIL/uL — ABNORMAL LOW (ref 4.22–5.81)
RDW: 16.5 % — ABNORMAL HIGH (ref 11.5–15.5)
WBC Count: 14 K/uL — ABNORMAL HIGH (ref 4.0–10.5)
nRBC: 1.3 % — ABNORMAL HIGH (ref 0.0–0.2)

## 2024-04-05 LAB — TSH: TSH: 3.352 u[IU]/mL (ref 0.350–4.500)

## 2024-04-05 MED ORDER — SODIUM CHLORIDE 0.9 % IV SOLN
200.0000 mg | Freq: Once | INTRAVENOUS | Status: AC
  Administered 2024-04-05: 200 mg via INTRAVENOUS
  Filled 2024-04-05: qty 8

## 2024-04-05 MED ORDER — SODIUM CHLORIDE 0.9 % IV SOLN
INTRAVENOUS | Status: DC
  Filled 2024-04-05: qty 250

## 2024-04-05 MED ORDER — TRAMADOL HCL 50 MG PO TABS: 100.0000 mg | ORAL_TABLET | Freq: Four times a day (QID) | ORAL | 2 refills | Status: DC | PRN

## 2024-04-05 MED ORDER — SODIUM CHLORIDE 0.9 % IV SOLN
Freq: Once | INTRAVENOUS | Status: AC
  Filled 2024-04-05: qty 250

## 2024-04-05 MED ORDER — MEGESTROL ACETATE 40 MG PO TABS: 40.0000 mg | ORAL_TABLET | Freq: Every day | ORAL | 3 refills | Status: DC

## 2024-04-05 NOTE — Progress Notes (Signed)
 Nutrition Follow-up:  Patient with stage IV high-grade urothelial carcinoma. Currently receiving keytruda  only at this time.   Met with patient during infusion.  Reports poor appetite for the past 3-4 weeks.  Yesterday ate 1/2 chicken skewer and 1/2 baked beans.  Has been eating peach slices.   Denies nausea.  Does not really like ensure/boost shakes but has some at home to drink.      Medications: megace added today  Labs: Na 132, glucose 148, BUN 41, creatinine 2.27, calcium  8.2  Anthropometrics:   Weight 126 lb today 142 lb on 10/3 149 lb on 9/11 151 lb on 8/25 155 lb on 10/10/23 165 lb on 03/07/23  11% weight loss in the last 3 weeks, significant   NUTRITION DIAGNOSIS: Inadequate oral intake related to cancer and related treatment side effects as evidenced by 11% weight loss in the last 3 weeks and eating less than 75% of estimated energy needs in the last month.    INTERVENTION:  Encouraged taking trial of appetite stimulant Discussed ways to flavor ensure/boost shakes. Samples of ensure complete and boost VHC provided. Discussed ways to add calories and protein in diet.  Handout provided Encouraged setting schedule to eat q 2-3 hours.  Set alarm on phone to remind to eat     MONITORING, EVALUATION, GOAL: weight trends, intake   NEXT VISIT: Friday, Nov 14 during infusion  Ryan Dixon SOLON, CSO, LDN Registered Dietitian [phone]

## 2024-04-05 NOTE — Progress Notes (Signed)
 DISCONTINUE ON PATHWAY REGIMEN - Bladder     A cycle is every 21 days:     Enfortumab vedotin -ejfv      Pembrolizumab    **Always confirm dose/schedule in your pharmacy ordering system**  PRIOR TREATMENT: [id number]: Enfortumab vedotin  1.25 mg/kg D1, 8 + Pembrolizumab  200 mg D1 (up to 35 Cycles) q21 Days Until Progression or Toxicity  START OFF PATHWAY REGIMEN - Bladder   [id number]:Pembrolizumab  200 mg IV D1 q21 Days:   A cycle is every 21 days:     Pembrolizumab    **Always confirm dose/schedule in your pharmacy ordering system**  Patient Characteristics: Advanced/Metastatic Disease, First Line Therapeutic Status: Advanced/Metastatic Disease Line of Therapy: First Line Intent of Therapy: Non-Curative / Palliative Intent, Discussed with Patient

## 2024-04-05 NOTE — Progress Notes (Signed)
 [facility]  [phone] [phone]  ID: Ryan Dixon OB: May 18, 1956  MR#: [id number]  RDW#:[id number]  Patient Care Team: [name], MD as PCP - General (Family Medicine) [name], MD as Consulting Physician (Oncology)  CHIEF COMPLAINT: Stage IV high-grade urothelial carcinoma.  INTERVAL HISTORY: Patient returns to clinic today for repeat laboratory, further evaluation, and continuation of single agent Keytruda .  He continues to have chronic weakness and fatigue.  He has a poor appetite and weight loss.  He has pain all over.  He has no neurologic complaints.  He denies any recent fevers or illnesses.  He has no chest pain, cough, or hemoptysis.  He admits to dyspnea on exertion.  He denies any abdominal pain.  He denies any nausea, vomiting, constipation, or diarrhea.  He has no urinary complaints.  Patient offers no further specific complaints today.  REVIEW OF SYSTEMS:   Review of Systems  Constitutional:  Positive for malaise/fatigue and weight loss. Negative for fever.  HENT:  Negative for nosebleeds.   Respiratory:  Positive for shortness of breath. Negative for cough and hemoptysis.   Cardiovascular: Negative.  Negative for chest pain and leg swelling.  Gastrointestinal: Negative.  Negative for abdominal pain and melena.  Genitourinary: Negative.  Negative for dysuria.  Musculoskeletal:  Positive for joint pain and myalgias. Negative for back pain.  Skin: Negative.  Negative for itching and rash.  Neurological:  Positive for weakness. Negative for dizziness, focal weakness and headaches.  Psychiatric/Behavioral: Negative.  The patient is not nervous/anxious.     As per HPI. Otherwise, a complete review of systems is negative.  PAST MEDICAL HISTORY: Past Medical History:  Diagnosis Date   Aortic atherosclerosis    Chronic foot pain, left    History of hematuria    Hypertension    Other emphysema (HCC)    Other  male erectile dysfunction    Pre-diabetes    Pure hypercholesterolemia    Tobacco dependence     PAST SURGICAL HISTORY: Past Surgical History:  Procedure Laterality Date   ANKLE ARTHROSCOPY Left 08/11/2020   Procedure: LEFT ANKLE ARTHROSCOPY AND DEBRIDEMENT;  Surgeon: [name], MD;  Location: [facility];  Service: Orthopedics;  Laterality: Left;   BLADDER INSTILLATION N/A 11/29/2022   Procedure: BLADDER INSTILLATION OF GEMCITABINE ;  Surgeon: [name], MD;  Location: [facility];  Service: Urology;  Laterality: N/A;   COLONOSCOPY     CYSTOSCOPY WITH INJECTION N/A 07/07/2023   Procedure: CYSTOSCOPY WITH INJECTION;  Surgeon: [name], MD;  Location: [facility];  Service: Urology;  Laterality: N/A;   IR IMAGING GUIDED PORT INSERTION  01/20/2023   PELVIC LYMPH NODE DISSECTION N/A 07/07/2023   Procedure: PELVIC LYMPH NODE DISSECTION;  Surgeon: [name], MD;  Location: [facility];  Service: Urology;  Laterality: N/A;   ROBOT ASSISTED LAPAROSCOPIC COMPLETE CYSTECT ILEAL CONDUIT N/A 07/07/2023   Procedure: XI ROBOTIC ASSISTED LAPAROSCOPIC COMPLETE CYSTECT ILEAL CONDUIT, UMBILICAL HERNIA REPAIR, BILATERAL INGUINAL HERNIA REPAIR;  Surgeon: [name], MD;  Location: [facility];  Service: Urology;  Laterality: N/A;   ROBOT ASSISTED LAPAROSCOPIC RADICAL PROSTATECTOMY N/A 07/07/2023   Procedure: XI ROBOTIC ASSISTED LAPAROSCOPIC RADICAL PROSTATECTOMY;  Surgeon: [name], MD;  Location: [facility];  Service: Urology;  Laterality: N/A;   TONSILLECTOMY     TRANSURETHRAL RESECTION OF BLADDER TUMOR N/A 11/29/2022   Procedure: TRANSURETHRAL RESECTION OF BLADDER TUMOR (TURBT);  Surgeon: [name],  MD;  Location: [facility];  Service: Urology;  Laterality: N/A;    FAMILY HISTORY: No family history on file.  ADVANCED DIRECTIVES (Y/N):  N  HEALTH MAINTENANCE: Social History   Tobacco Use   Smoking status: Every Day    Current packs/day: 2.00     Average packs/day: 2.0 packs/day for 43.0 years (86.0 ttl pk-yrs)    Types: Cigarettes   Smokeless tobacco: Never  Substance Use Topics   Alcohol use: Yes    Comment: 1-2 beers/day   Drug use: Never     Colonoscopy:  PAP:  Bone density:  Lipid panel:  Allergies  Allergen Reactions   Nsaids Other (See Comments)    GI upset   Other Other (See Comments)    Anti-inflammatories cause GI upset, N/V    Current Outpatient Medications  Medication Sig Dispense Refill   albuterol  (VENTOLIN  HFA) 108 (90 Base) MCG/ACT inhaler Inhale 2 puffs into the lungs every 6 (six) hours as needed for wheezing or shortness of breath. 8 g 2   megestrol (MEGACE) 40 MG tablet Take 1 tablet (40 mg total) by mouth daily. 30 tablet 3   senna-docusate (SENOKOT-S) 8.6-50 MG tablet Take 1 tablet by mouth 2 (two) times daily. While taking strong pain meds to prevent constipation. 10 tablet 0   traMADol  (ULTRAM ) 50 MG tablet Take 2 tablets (100 mg total) by mouth every 6 (six) hours as needed. 120 tablet 2   Wheat Dextrin (BENEFIBER) POWD Take 0.5 Scoops by mouth daily.     atorvastatin  (LIPITOR) 20 MG tablet TAKE 1 TABLET BY MOUTH EVERY DAY AT NIGHT (Patient not taking: Reported on 04/05/2024)     Betamethasone  Dipropionate (SERNIVO ) 0.05 % EMUL Apply topically twice daily to itchy rash as needed. Avoid face, groin, axilla (Patient not taking: Reported on 04/05/2024) 120 mL 4   hydrochlorothiazide (HYDRODIURIL) 25 MG tablet Take 25 mg by mouth daily. (Patient not taking: Reported on 04/05/2024)     mupirocin  ointment (BACTROBAN ) 2 % Apply topically to wound at right thigh twice daily and cover until healed (Patient not taking: Reported on 04/05/2024) 30 g 1   oxyCODONE  (ROXICODONE ) 5 MG immediate release tablet Take 1 tablet (5 mg total) by mouth every 6 (six) hours as needed for moderate pain (pain score 4-6) or severe pain (pain score 7-10) (post-operatively). (Patient not taking: Reported on 04/05/2024) 15 tablet 0    triamcinolone  ointment (KENALOG ) 0.5 % Apply 1 Application topically 2 (two) times daily. (Patient not taking: Reported on 04/05/2024) 30 g 0   varenicline  (CHANTIX ) 1 MG tablet Take 1 mg by mouth 2 (two) times daily. (Patient not taking: Reported on 04/05/2024)     No current facility-administered medications for this visit.   Facility-Administered Medications Ordered in Other Visits  Medication Dose Route Frequency Provider Last Rate Last Admin   0.9 %  sodium chloride  infusion   Intravenous Continuous Jacobo, Lamonte Hartt J, MD       0.9 %  sodium chloride  infusion   Intravenous Once Zaccheaus Storlie J, MD 999 mL/hr at 04/05/24 0958 New Bag at 04/05/24 0958   heparin  lock flush 100 unit/mL  500 Units Intravenous Once Agrawal, Kavita, MD       pembrolizumab  (KEYTRUDA ) 200 mg in sodium chloride  0.9 % 50 mL chemo infusion  200 mg Intravenous Once Yarnell Arvidson J, MD        OBJECTIVE: Vitals:   04/05/24 0905  BP: 103/70  Pulse: 85  Resp: 18  Temp: 98.4  F (36.9 C)  SpO2: 97%      Body mass index is 18.08 kg/m.    ECOG FS:1 - Symptomatic but completely ambulatory  General: Well-developed, well-nourished, no acute distress. Eyes: Pink conjunctiva, anicteric sclera. HEENT: Normocephalic, moist mucous membranes. Lungs: No audible wheezing or coughing. Heart: Regular rate and rhythm. Abdomen: Soft, nontender, no obvious distention. Musculoskeletal: No edema, cyanosis, or clubbing. Neuro: Alert, answering all questions appropriately. Cranial nerves grossly intact. Skin: No rashes or petechiae noted. Psych: Normal affect.  LAB RESULTS:  Lab Results  Component Value Date   NA 132 (L) 04/05/2024   K 4.5 04/05/2024   CL 101 04/05/2024   CO2 17 (L) 04/05/2024   GLUCOSE 148 (H) 04/05/2024   BUN 41 (H) 04/05/2024   CREATININE 2.27 (H) 04/05/2024   CALCIUM  8.2 (L) 04/05/2024   PROT 6.5 04/05/2024   ALBUMIN  2.4 (L) 04/05/2024   AST 67 (H) 04/05/2024   ALT 27 04/05/2024    ALKPHOS PENDING 04/05/2024   BILITOT 0.8 04/05/2024   GFRNONAA 31 (L) 04/05/2024    Lab Results  Component Value Date   WBC 14.0 (H) 04/05/2024   NEUTROABS 9.2 (H) 04/05/2024   HGB 8.6 (L) 04/05/2024   HCT 27.2 (L) 04/05/2024   MCV 93.5 04/05/2024   PLT 52 (L) 04/05/2024     STUDIES: No results found.  ONCOLOGY HISTORY:  Patient was initially stage IIIb and received neoadjuvant chemotherapy with dose dense MVAC completing treatment on April 05, 2023.  He subsequently underwent cystoprostatectomy on July 07, 2023.  Patient was noted to have residual disease and 2 of 14 lymph nodes positive for malignancy.  He started on adjuvant nivolumab  with plans to do 1 year of maintenance treatment, but developed grade 3 rash.  Per dermatology, rash was related to an exacerbation of psoriasis and not a reaction to his immunotherapy.  Rash resolved with topical treatment and systemic steroids.  Despite this, patient elected to not to reinitiate maintenance treatment.  Imaging on Oct 23, 2023 with no obvious evidence of recurrent or progressive disease.   ASSESSMENT: Stage IV high-grade urothelial carcinoma.  PLAN:    Stage IV high-grade urothelial carcinoma: See oncology history as above.  Repeat imaging on January 26, 2024 revealed new widespread metastatic disease involving liver and bone.  Initial plan was to treat with pembrolizumab  and enfortumab vedotin  on day 1 with Enfortumab only on day 8.  This will be a 21-day cycle.  But given patient's persistent thrombocytopenia patient will receive Keytruda  only until his platelet count recovers.  Proceed with cycle 3 of Keytruda .  Return to clinic in 3 weeks for further evaluation and consideration of cycle 4.  Will reimage after cycle 4.   Pain: Patient states tramadol  helps a little.  Patient would prefer not to switch to oxycodone  and instead will increase his dose to 100 mg every 6 hours as needed. Poor appetite/weight loss: Patient was given a  prescription for Megace today.  He also has consultation with dietary. Hyponatremia: Chronic and unchanged.  Patient sodium level is 132. Renal insufficiency: Patient's creatinine has increased to 2.27.  He will receive an additional liter of IV fluids today. Leukocytosis: Likely reactive, monitor. Anemia: Patient's hemoglobin is decreased, but relatively stable at 8.6.  Suspect this is a combination of chemotherapy, thrombocytopenia and possible bleeding from gastritis given the amount of Advil patient was previously taking.  He has been instructed to discontinue Advil altogether.  He does not require transfusion today. Thrombocytopenia: Chronic  and unchanged.  Patient's platelet count is 52 today.  Keytruda  only as above. Psoriasis exacerbation: Case discussed with dermatology.  Dermatology intends to hold initiating Sotyku until blood counts improved. 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  Continue follow-up with per ID.   Rash: Patient does not complain of this today.  Monitor while on immunotherapy. Dark tarry stools: Patient does not complain of this today.  Likely secondary to Advil intake and thrombocytopenia.  Monitor.  Patient expressed understanding and was in agreement with this plan. He also understands that He can call clinic at any time with any questions, concerns, or complaints.    Cancer Staging  Bladder cancer [facility]) Staging form: Urinary Bladder, AJCC 8th Edition - Clinical stage from 12/07/2022: Stage II (cT2, cN0, cM0) - Signed by Agrawal, Kavita, MD on 01/02/2023 Stage prefix: Initial diagnosis WHO/ISUP grade (low/high): High Grade Histologic grading system: 2 grade system - Pathologic stage from 07/07/2023: Stage IIIB (ypT2a, ypN2, cM0) - Signed by Agrawal, Kavita, MD on 08/31/2023 Stage prefix: Post-therapy Response to neoadjuvant therapy: Partial response WHO/ISUP grade (low/high): High Grade Histologic grading system: 2 grade  system   Evalene JINNY Reusing, MD   04/05/2024 10:04 AM

## 2024-04-05 NOTE — Progress Notes (Signed)
 No energy, no appetite, chest tightness, SOB. Pain in both knees hips back shoulders, 7/10, taking less tramadol  so that he doesn't run out, says every 6 hours but only 28 tablets.  Main concern is SOB, worse over the last 3 weeks.  Did get some boost.  Constipation.

## 2024-04-07 ENCOUNTER — Other Ambulatory Visit: Payer: Self-pay

## 2024-04-07 ENCOUNTER — Emergency Department

## 2024-04-07 ENCOUNTER — Emergency Department
Admission: EM | Admit: 2024-04-07 | Discharge: 2024-04-07 | Disposition: A | Discharge status: Home / Self Care | Source: Home / Self Care

## 2024-04-07 ENCOUNTER — Encounter: Payer: Self-pay | Admitting: Oncology

## 2024-04-07 DIAGNOSIS — G9340 Encephalopathy, unspecified: Secondary | ICD-10-CM | POA: Diagnosis not present

## 2024-04-07 DIAGNOSIS — R531 Weakness: Secondary | ICD-10-CM | POA: Insufficient documentation

## 2024-04-07 DIAGNOSIS — N390 Urinary tract infection, site not specified: Secondary | ICD-10-CM | POA: Diagnosis not present

## 2024-04-07 DIAGNOSIS — Z8551 Personal history of malignant neoplasm of bladder: Secondary | ICD-10-CM | POA: Insufficient documentation

## 2024-04-07 DIAGNOSIS — G893 Neoplasm related pain (acute) (chronic): Secondary | ICD-10-CM | POA: Insufficient documentation

## 2024-04-07 DIAGNOSIS — R4182 Altered mental status, unspecified: Secondary | ICD-10-CM | POA: Insufficient documentation

## 2024-04-07 LAB — URINALYSIS, W/ REFLEX TO CULTURE (INFECTION SUSPECTED)
Bacteria, UA: NONE SEEN
Bilirubin Urine: NEGATIVE
Glucose, UA: NEGATIVE mg/dL
Ketones, ur: NEGATIVE mg/dL
Nitrite: POSITIVE — AB
Protein, ur: 100 mg/dL — AB
RBC / HPF: >50 RBC/hpf (ref 0–5)
Specific Gravity, Urine: 1.016 (ref 1.005–1.030)
Squamous Epithelial / HPF: 0 /HPF (ref 0–5)
WBC, UA: >50 WBC/hpf (ref 0–5)
pH: 9 — ABNORMAL HIGH (ref 5.0–8.0)

## 2024-04-07 LAB — T4: T4, Total: 5.2 ug/dL (ref 4.5–12.0)

## 2024-04-07 LAB — COMPREHENSIVE METABOLIC PANEL WITH GFR
ALT: 27 U/L (ref 0–44)
AST: 58 U/L — ABNORMAL HIGH (ref 15–41)
Albumin: 2.3 g/dL — ABNORMAL LOW (ref 3.5–5.0)
Alkaline Phosphatase: 2065 U/L — ABNORMAL HIGH (ref 38–126)
Anion gap: 17 — ABNORMAL HIGH (ref 5–15)
BUN: 44 mg/dL — ABNORMAL HIGH (ref 8–23)
CO2: 19 mmol/L — ABNORMAL LOW (ref 22–32)
Calcium: 8 mg/dL — ABNORMAL LOW (ref 8.9–10.3)
Chloride: 103 mmol/L (ref 98–111)
Creatinine, Ser: 1.87 mg/dL — ABNORMAL HIGH (ref 0.61–1.24)
GFR, Estimated: 39 mL/min — ABNORMAL LOW (ref >60)
Glucose, Bld: 109 mg/dL — ABNORMAL HIGH (ref 70–99)
Potassium: 4.8 mmol/L (ref 3.5–5.1)
Sodium: 136 mmol/L (ref 135–145)
Total Bilirubin: 1.3 mg/dL — ABNORMAL HIGH (ref 0.0–1.2)
Total Protein: 7 g/dL (ref 6.5–8.1)

## 2024-04-07 LAB — CBC
HCT: 26.5 % — ABNORMAL LOW (ref 39.0–52.0)
Hemoglobin: 8.3 g/dL — ABNORMAL LOW (ref 13.0–17.0)
MCH: 29.5 pg (ref 26.0–34.0)
MCHC: 31.3 g/dL (ref 30.0–36.0)
MCV: 94.3 fL (ref 80.0–100.0)
Platelets: 40 K/uL — ABNORMAL LOW (ref 150–400)
RBC: 2.81 MIL/uL — ABNORMAL LOW (ref 4.22–5.81)
RDW: 16.6 % — ABNORMAL HIGH (ref 11.5–15.5)
WBC: 9.6 K/uL (ref 4.0–10.5)
nRBC: 1 % — ABNORMAL HIGH (ref 0.0–0.2)

## 2024-04-07 LAB — TROPONIN I (HIGH SENSITIVITY): Troponin I (High Sensitivity): 16 ng/L (ref <18)

## 2024-04-07 LAB — MAGNESIUM: Magnesium: 2.2 mg/dL (ref 1.7–2.4)

## 2024-04-07 MED ORDER — SODIUM CHLORIDE 0.9 % IV SOLN: Freq: Once | INTRAVENOUS | Status: AC

## 2024-04-07 NOTE — ED Provider Notes (Signed)
 Clinical Course as of 04/07/24 1814  Sun Apr 07, 2024  1532 Received signout: stage IV bladder cancer with urostomy. Increasing weakness x 1 week. Possibly more confused. [ ]  Reassess  [HD]  1714 Patient and family would like to return home if at all possible.  Awaiting urine results.  Does not have a leukocytosis and his labs appear baseline for him [HD]  1803 Called lab to determine why urinalysis is taking so long. They have placed me on hold for greater than 5 minutes.  [HD]  1808 Called lab a second time.  [HD]  1809 Bacteria, UA: NONE SEEN No bacteria [HD]  1813 Patient states that urine sample was taken from the bag and not directly from the urostomy.  Given that there is no bacteria and no leukocytosis and no significant acute renal insufficiency I will hold off on antibiotics as antibiotics will delay his chemotherapy regimen.  I have sent it for urine culture.  Patient and family are adamant that they wish to return home at this time.  Patient and family were counseled not to drive operate machinery or go to work.  All questions answered and patient voiced understanding will work on discharge [HD]    Clinical Course User Index [HD] Nicholaus Rolland BRAVO, MD   At time of discharge there is no evidence of acute life, limb, vision, or fertility threat. Patient has stable vital signs, pain is well controlled, patient is ambulatory and p.o. tolerant.  Discharge instructions were completed using the EPIC system. I would refer you to those at this time. All warnings prescriptions follow-up etc. were discussed in detail with the patient. Patient indicates understanding and is agreeable with this plan. All questions answered.  Patient is made aware that they may return to the emergency department for any worsening or new condition or for any other emergency.    Nicholaus Rolland BRAVO, MD 04/07/24 [id number]

## 2024-04-07 NOTE — Discharge Instructions (Signed)
 Do not drive or operate any machinery, do not go to work until cleared by physician.  Return if any acutely worsening symptoms or any other emergency.  -- RETURN PRECAUTIONS & AFTERCARE: (ENGLISH) RETURN PRECAUTIONS: Return immediately to the emergency department or see/call your doctor if you feel worse, weak or have changes in speech or vision, are short of breath, have fever, vomiting, pain, bleeding or dark stool, trouble urinating or any new issues. Return here or see/call your doctor if not improving as expected for your suspected condition. FOLLOW-UP CARE: Call your doctor and/or any doctors we referred you to for more advice and to make an appointment. Do this today, tomorrow or after the weekend. Some doctors only take PPO insurance so if you have HMO insurance you may want to contact your HMO or your regular doctor for referral to a specialist within your plan. Either way tell the doctor's office that it was a referral from the emergency department so you get the soonest possible appointment.  YOUR TEST RESULTS: Take result reports of any blood or urine tests, imaging tests and EKG's to your doctor and any referral doctor. Have any abnormal tests repeated. Your doctor or a referral doctor can let you know when this should be done. Also make sure your doctor contacts this hospital to get any test results that are not currently available such as cultures or special tests for infection and final imaging reports, which are often not available at the time you leave the ER but which may list additional important findings that are not documented on the preliminary report. BLOOD PRESSURE: If your blood pressure was greater than 120/80 have your blood pressure rechecked within 1 to 2 weeks. MEDICATION SIDE EFFECTS: Do not drive, walk, bike, take the bus, etc. if you have received or are being prescribed any sedating medications such as those for pain or anxiety or certain antihistamines like Benadryl . If  you have been give one of these here get a taxi home or have a friend drive you home. Ask your pharmacist to counsel you on potential side effects of any new medication

## 2024-04-07 NOTE — ED Provider Notes (Signed)
 [facility] Provider Note    Event Date/Time   First MD Initiated Contact with Patient 04/07/24 1350     (approximate)   History   Altered Mental Status   HPI  Ryan Dixon is a 68 y.o. male with stage IV bladder cancer who presents with weakness, possible altered mental status.  Family reports the patient seems to be slurring his speech more and occasionally seems to be confused, they have noted this for about a week.  Review of records demonstrates the patient saw his oncologist 2 days ago Dr. Jacobo.  Patient has had chronic weakness which he states seems to be worse over the last week as well.  No reports of fever     Physical Exam   Triage Vital Signs: ED Triage Vitals  Encounter Vitals Group     BP 04/07/24 1344 96/68     Girls Systolic BP Percentile --      Girls Diastolic BP Percentile --      Boys Systolic BP Percentile --      Boys Diastolic BP Percentile --      Pulse Rate 04/07/24 1344 (!) 120     Resp 04/07/24 1344 18     Temp 04/07/24 1344 98.1 F (36.7 C)     Temp Source 04/07/24 1344 Oral     SpO2 04/07/24 1344 100 %     Weight 04/07/24 1345 57.2 kg (126 lb)     Height 04/07/24 1345 1.778 m (5' 10)     Head Circumference --      Peak Flow --      Pain Score 04/07/24 1345 0     Pain Loc --      Pain Education --      Exclude from Growth Chart --     Most recent vital signs: Vitals:   04/07/24 1344 04/07/24 1400  BP: 96/68 118/65  Pulse: (!) 120 100  Resp: 18 (!) 22  Temp: 98.1 F (36.7 C)   SpO2: 100% 96%     General: Awake, no distress.  CV:  Good peripheral perfusion.  Mild tachycardia Resp:  Normal effort.  Clear to auscultation bilaterally Abd:  No distention.  Urostomy bag, yellow urine Other:  Moving all extremities equal, neuroexam appears normal, cranial nerves II through XII are unremarkable, PERRLA, EOMI   ED Results / Procedures / Treatments   Labs (all labs ordered are listed, but only abnormal  results are displayed) Labs Reviewed  CBC - Abnormal; Notable for the following components:      Result Value   RBC 2.81 (*)    Hemoglobin 8.3 (*)    HCT 26.5 (*)    RDW 16.6 (*)    Platelets 40 (*)    nRBC 1.0 (*)    All other components within normal limits  COMPREHENSIVE METABOLIC PANEL WITH GFR - Abnormal; Notable for the following components:   CO2 19 (*)    Glucose, Bld 109 (*)    BUN 44 (*)    Creatinine, Ser 1.87 (*)    Calcium  8.0 (*)    Albumin  2.3 (*)    AST 58 (*)    Alkaline Phosphatase 2,065 (*)    Total Bilirubin 1.3 (*)    GFR, Estimated 39 (*)    Anion gap 17 (*)    All other components within normal limits  MAGNESIUM   URINALYSIS, W/ REFLEX TO CULTURE (INFECTION SUSPECTED)  TROPONIN I (HIGH SENSITIVITY)  EKG ED ECG REPORT I, Lamar Price, the attending physician, personally viewed and interpreted this ECG.  Date: 04/07/2024  Rhythm: Sinus tachycardia QRS Axis: normal Intervals: normal ST/T Wave abnormalities: normal Narrative Interpretation: no evidence of acute ischemia     RADIOLOGY Chest x-ray with new right upper lobe nodule CT scan without acute findings    PROCEDURES:  Critical Care performed:   Procedures   MEDICATIONS ORDERED IN ED: Medications  0.9 %  sodium chloride  infusion ( Intravenous New Bag/Given 04/07/24 1425)     IMPRESSION / MDM / ASSESSMENT AND PLAN / ED COURSE  I reviewed the triage vital signs and the nursing notes. Patient's presentation is most consistent with severe exacerbation of chronic illness.  Patient presents with symptoms as detailed above, differentials extensive and includes electrolyte abnormality, dehydration, infection, stroke, metastases, cancer related fatigue  He is afebrile here however mildly tachycardic and mildly tachypneic.  Will obtain broad workup including CT head, labs, chest x-ray, will give IV fluids  Patient has chronically elevated alk phos likely from liver  metastases  White blood cell count is normal  Chest x-ray, CT reassuring  Pending urinalysis, will ask my colleague to follow-up on this result and reassess the patient        FINAL CLINICAL IMPRESSION(S) / ED DIAGNOSES   Final diagnoses:  Altered mental status, unspecified altered mental status type  Generalized weakness  Cancer associated pain     Rx / DC Orders   ED Discharge Orders     None        Note:  This document was prepared using Dragon voice recognition software and may include unintentional dictation errors.   Price Lamar, MD 04/07/24 [id number]

## 2024-04-07 NOTE — ED Triage Notes (Signed)
 Pt presents to the ED via POV from home with wife. Pt has stage four bladder cancer and is currently taking chemo treatments. Per wife pt has been talking gibberish since Friday. Pt is A&Ox4. Wife states that sometimes he speaks and you just can't understand him. Pt reports fatigue.

## 2024-04-08 ENCOUNTER — Inpatient Hospital Stay

## 2024-04-08 ENCOUNTER — Encounter: Payer: Self-pay | Admitting: Hospice and Palliative Medicine

## 2024-04-08 ENCOUNTER — Telehealth: Payer: Self-pay | Admitting: Oncology

## 2024-04-08 ENCOUNTER — Inpatient Hospital Stay (HOSPITAL_BASED_OUTPATIENT_CLINIC_OR_DEPARTMENT_OTHER): Admitting: Hospice and Palliative Medicine

## 2024-04-08 ENCOUNTER — Other Ambulatory Visit: Payer: Self-pay

## 2024-04-08 VITALS — BP 95/65 | HR 110 | Temp 98.6°F | Resp 20 | Wt 126.4 lb

## 2024-04-08 DIAGNOSIS — E860 Dehydration: Secondary | ICD-10-CM

## 2024-04-08 DIAGNOSIS — C679 Malignant neoplasm of bladder, unspecified: Secondary | ICD-10-CM

## 2024-04-08 DIAGNOSIS — D649 Anemia, unspecified: Secondary | ICD-10-CM

## 2024-04-08 DIAGNOSIS — Z5112 Encounter for antineoplastic immunotherapy: Secondary | ICD-10-CM | POA: Diagnosis not present

## 2024-04-08 LAB — CMP (CANCER CENTER ONLY)
ALT: 29 U/L (ref 0–44)
AST: 55 U/L — ABNORMAL HIGH (ref 15–41)
Albumin: 2.3 g/dL — ABNORMAL LOW (ref 3.5–5.0)
Alkaline Phosphatase: 1319 U/L — ABNORMAL HIGH (ref 38–126)
Anion gap: 14 (ref 5–15)
BUN: 46 mg/dL — ABNORMAL HIGH (ref 8–23)
CO2: 15 mmol/L — ABNORMAL LOW (ref 22–32)
Calcium: 7.8 mg/dL — ABNORMAL LOW (ref 8.9–10.3)
Chloride: 104 mmol/L (ref 98–111)
Creatinine: 1.87 mg/dL — ABNORMAL HIGH (ref 0.61–1.24)
GFR, Estimated: 39 mL/min — ABNORMAL LOW (ref >60)
Glucose, Bld: 92 mg/dL (ref 70–99)
Potassium: 4.5 mmol/L (ref 3.5–5.1)
Sodium: 133 mmol/L — ABNORMAL LOW (ref 135–145)
Total Bilirubin: 1.3 mg/dL — ABNORMAL HIGH (ref 0.0–1.2)
Total Protein: 6.2 g/dL — ABNORMAL LOW (ref 6.5–8.1)

## 2024-04-08 LAB — CBC WITH DIFFERENTIAL (CANCER CENTER ONLY)
Abs Immature Granulocytes: 1.61 K/uL — ABNORMAL HIGH (ref 0.00–0.07)
Basophils Absolute: 0 K/uL (ref 0.0–0.1)
Basophils Relative: 0 %
Eosinophils Absolute: 0.1 K/uL (ref 0.0–0.5)
Eosinophils Relative: 1 %
HCT: 23.9 % — ABNORMAL LOW (ref 39.0–52.0)
Hemoglobin: 7.7 g/dL — ABNORMAL LOW (ref 13.0–17.0)
Immature Granulocytes: 15 %
Lymphocytes Relative: 10 %
Lymphs Abs: 1 K/uL (ref 0.7–4.0)
MCH: 29.6 pg (ref 26.0–34.0)
MCHC: 32.2 g/dL (ref 30.0–36.0)
MCV: 91.9 fL (ref 80.0–100.0)
Monocytes Absolute: 0.6 K/uL (ref 0.1–1.0)
Monocytes Relative: 6 %
Neutro Abs: 7.1 K/uL (ref 1.7–7.7)
Neutrophils Relative %: 68 %
Platelet Count: 38 K/uL — ABNORMAL LOW (ref 150–400)
RBC: 2.6 MIL/uL — ABNORMAL LOW (ref 4.22–5.81)
RDW: 16.8 % — ABNORMAL HIGH (ref 11.5–15.5)
WBC Count: 10.5 K/uL (ref 4.0–10.5)
nRBC: 1.1 % — ABNORMAL HIGH (ref 0.0–0.2)

## 2024-04-08 MED ORDER — SODIUM CHLORIDE 0.9 % IV SOLN
INTRAVENOUS | Status: DC
  Filled 2024-04-08 (×2): qty 250

## 2024-04-08 MED ORDER — MIRTAZAPINE 7.5 MG PO TABS: 7.5000 mg | ORAL_TABLET | Freq: Every day | ORAL | 0 refills | Status: DC

## 2024-04-08 NOTE — Progress Notes (Signed)
 Symptom Management Clinic [facility] [phone] Fax:([phone]) [phone]  Patient Care Team: [name], MD as PCP - General (Family Medicine) [name], MD as Consulting Physician (Oncology)   NAME OF PATIENT: Ryan Dixon  [id number]  1955-08-06   DATE OF VISIT: 04/08/24  REASON FOR CONSULT: Ryan Dixon is a 68 y.o. 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  INTERVAL HISTORY: Patient was treated with single agent Keytruda .  Patient was seen yesterday in the emergency department with complaint of weakness and mental status.  Symptoms have been ongoing for about a week.  Chest x-ray showed new right upper lobe nodule concerning for metastatic disease but otherwise no acute findings.  CT of the head did not show any acute intracranial abnormality.  Patient's UA was grossly atypical.  However, urine specimen apparently was collected from bag instead of the urostomy.  Patient reports fatigue, some shortness of breath over the past.  No cough or congestion.  Denies fever or chills.  Appetite has been extremely poor.  Patient endorses some depression and anxiety.  Denies any neurologic complaints.  Denies any easy bleeding or bruising.  No dark or tarry stools.  Denies chest pain. Denies any nausea, vomiting,  or diarrhea.  Does have constipation.  Denies urinary complaints. Patient offers no further specific complaints today.   PAST MEDICAL HISTORY: Past Medical History:  Diagnosis Date   Aortic atherosclerosis    Chronic foot pain, left    History of hematuria    Hypertension    Other emphysema (HCC)    Other male erectile dysfunction    Pre-diabetes    Pure hypercholesterolemia    Tobacco dependence     PAST SURGICAL HISTORY:  Past Surgical History:  Procedure Laterality Date   ANKLE ARTHROSCOPY Left  08/11/2020   Procedure: LEFT ANKLE ARTHROSCOPY AND DEBRIDEMENT;  Surgeon: [name], MD;  Location: [facility];  Service: Orthopedics;  Laterality: Left;   BLADDER INSTILLATION N/A 11/29/2022   Procedure: BLADDER INSTILLATION OF GEMCITABINE ;  Surgeon: [name], MD;  Location: [facility];  Service: Urology;  Laterality: N/A;   COLONOSCOPY     CYSTOSCOPY WITH INJECTION N/A 07/07/2023   Procedure: CYSTOSCOPY WITH INJECTION;  Surgeon: [name], MD;  Location: [facility];  Service: Urology;  Laterality: N/A;   IR IMAGING GUIDED PORT INSERTION  01/20/2023   PELVIC LYMPH NODE DISSECTION N/A 07/07/2023   Procedure: PELVIC LYMPH NODE DISSECTION;  Surgeon: [name], MD;  Location: [facility];  Service: Urology;  Laterality: N/A;   ROBOT ASSISTED LAPAROSCOPIC COMPLETE CYSTECT ILEAL CONDUIT N/A 07/07/2023   Procedure: XI ROBOTIC ASSISTED LAPAROSCOPIC COMPLETE CYSTECT ILEAL CONDUIT, UMBILICAL HERNIA REPAIR, BILATERAL INGUINAL HERNIA REPAIR;  Surgeon: [name], MD;  Location: [facility];  Service: Urology;  Laterality: N/A;   ROBOT ASSISTED LAPAROSCOPIC RADICAL PROSTATECTOMY N/A 07/07/2023   Procedure: XI ROBOTIC ASSISTED LAPAROSCOPIC RADICAL PROSTATECTOMY;  Surgeon: [name], MD;  Location: [facility];  Service: Urology;  Laterality: N/A;   TONSILLECTOMY     TRANSURETHRAL RESECTION OF BLADDER TUMOR N/A 11/29/2022   Procedure: TRANSURETHRAL RESECTION OF BLADDER TUMOR (TURBT);  Surgeon: [name], MD;  Location: [facility];  Service: Urology;  Laterality: N/A;    HEMATOLOGY/ONCOLOGY HISTORY:  Oncology History  Bladder cancer (HCC)  10/25/2022 Initial Diagnosis   Seen by Dr. Twylla  for intermittent hematuria since February 2024 along with other urinary symptoms.   10/28/2022 Imaging   CT hematuria workup Showed 2.2 cm bladder mass worrisome for cancer.  Adjacent mild diffuse bladder wall thickening.  Based on the location this may involve the  extreme distal aspect of the right UVJ.  Extensive atherosclerotic changes with the occlusion of the right common iliac artery and areas of significant stenosis along the left. Please correlate with any symptoms such as claudication and further workup as clinically appropriate.  CT chest with contrast IMPRESSION: 1. Lung-RADS 2, benign appearance or behavior. Continue annual screening with low-dose chest CT without contrast in 12 months. 2. Similar ascending aortic dilatation at 4.2 cm. Recommend attention on follow-up lung cancer screening CT in 1 year.   11/29/2022 Procedure   Intraoperative findings:  Cystoscopy-urethra normal in caliber without stricture.  Prostate with mild lateral lobe enlargement and moderate bladder neck elevation.  UOs normal-appearing bilaterally.  The right UO was anterior to the bladder tumor Bladder tumor: 3 cm papillary/nodular tumor superior to right UO and extending laterally; ~ 1 cm papillary tumor anterior bladder neck 10:00; 2-1 cm papillary tumors anterior bladder neck 2:00    11/29/2022 Pathology Results        12/07/2022 Cancer Staging   Staging form: Urinary Bladder, AJCC 8th Edition - Clinical stage from 12/07/2022: Stage II (cT2, cN0, cM0) - Signed by Clista Bimler, MD on 01/02/2023 Stage prefix: Initial diagnosis WHO/ISUP grade (low/high): High Grade Histologic grading system: 2 grade system   01/02/2023 Initial Diagnosis   Bladder cancer (HCC)   01/23/2023 - 04/06/2023 Chemotherapy   Patient is on Treatment Plan : BLADDER DOSE DENSE MVAC q14d     07/07/2023 Cancer Staging   Staging form: Urinary Bladder, AJCC 8th Edition - Pathologic stage from 07/07/2023: Stage IIIB (ypT2a, pN2, cM0) - Signed by Clista Bimler, MD on 08/31/2023 Stage prefix: Post-therapy Response to neoadjuvant therapy: Partial response WHO/ISUP grade (low/high): High Grade Histologic grading system: 2 grade system   08/31/2023 - 09/26/2023 Chemotherapy   Patient is on  Treatment Plan : BLADDER Nivolumab  (240) q14d     02/23/2024 - 03/15/2024 Chemotherapy   Patient is on Treatment Plan : UROTHELIAL ADVANCED, METASTATIC ENFORTUMAB D1, D8 + PEMBROLIZUMAB  (200) D1 Q21D     04/05/2024 -  Chemotherapy   Patient is on Treatment Plan : BLADDER Pembrolizumab  (200) q21d       ALLERGIES:  is allergic to nsaids and other.  MEDICATIONS:  Current Outpatient Medications  Medication Sig Dispense Refill   albuterol  (VENTOLIN  HFA) 108 (90 Base) MCG/ACT inhaler Inhale 2 puffs into the lungs every 6 (six) hours as needed for wheezing or shortness of breath. 8 g 2   megestrol (MEGACE) 40 MG tablet Take 1 tablet (40 mg total) by mouth daily. 30 tablet 3   senna-docusate (SENOKOT-S) 8.6-50 MG tablet Take 1 tablet by mouth 2 (two) times daily. While taking strong pain meds to prevent constipation. 10 tablet 0   traMADol  (ULTRAM ) 50 MG tablet Take 2 tablets (100 mg total) by mouth every 6 (six) hours as needed. 120 tablet 2   Wheat Dextrin (BENEFIBER) POWD Take 0.5 Scoops by mouth daily.     atorvastatin  (LIPITOR) 20 MG tablet TAKE 1 TABLET BY MOUTH EVERY DAY AT NIGHT (Patient not taking: Reported on 04/08/2024)     Betamethasone  Dipropionate (SERNIVO ) 0.05 % EMUL Apply topically twice daily to itchy rash as needed. Avoid face, groin, axilla (Patient not taking: Reported on 04/08/2024)  120 mL 4   hydrochlorothiazide (HYDRODIURIL) 25 MG tablet Take 25 mg by mouth daily. (Patient not taking: Reported on 04/08/2024)     mupirocin  ointment (BACTROBAN ) 2 % Apply topically to wound at right thigh twice daily and cover until healed (Patient not taking: Reported on 04/08/2024) 30 g 1   oxyCODONE  (ROXICODONE ) 5 MG immediate release tablet Take 1 tablet (5 mg total) by mouth every 6 (six) hours as needed for moderate pain (pain score 4-6) or severe pain (pain score 7-10) (post-operatively). (Patient not taking: Reported on 04/08/2024) 15 tablet 0   triamcinolone  ointment (KENALOG ) 0.5 % Apply  1 Application topically 2 (two) times daily. (Patient not taking: Reported on 04/08/2024) 30 g 0   varenicline  (CHANTIX ) 1 MG tablet Take 1 mg by mouth 2 (two) times daily. (Patient not taking: Reported on 04/08/2024)     No current facility-administered medications for this visit.   Facility-Administered Medications Ordered in Other Visits  Medication Dose Route Frequency Provider Last Rate Last Admin   heparin  lock flush 100 unit/mL  500 Units Intravenous Once Agrawal, Kavita, MD        VITAL SIGNS: BP 95/65   Pulse (!) 110   Temp 98.6 F (37 C)   Resp 20   Wt 126 lb 6.4 oz (57.3 kg)   SpO2 100%   BMI 18.14 kg/m  Filed Weights   04/08/24 1053  Weight: 126 lb 6.4 oz (57.3 kg)    Estimated body mass index is 18.14 kg/m as calculated from the following:   Height as of 04/07/24: 5' 10 (1.778 m).   Weight as of this encounter: 126 lb 6.4 oz (57.3 kg).  LABS: CBC:    Component Value Date/Time   WBC 10.5 04/08/2024 1034   WBC 9.6 04/07/2024 1407   HGB 7.7 (L) 04/08/2024 1034   HCT 23.9 (L) 04/08/2024 1034   PLT 38 (L) 04/08/2024 1034   MCV 91.9 04/08/2024 1034   NEUTROABS PENDING 04/08/2024 1034   LYMPHSABS PENDING 04/08/2024 1034   MONOABS PENDING 04/08/2024 1034   EOSABS PENDING 04/08/2024 1034   BASOSABS PENDING 04/08/2024 1034   Comprehensive Metabolic Panel:    Component Value Date/Time   NA 136 04/07/2024 1407   K 4.8 04/07/2024 1407   CL 103 04/07/2024 1407   CO2 19 (L) 04/07/2024 1407   BUN 44 (H) 04/07/2024 1407   CREATININE 1.87 (H) 04/07/2024 1407   CREATININE 2.27 (H) 04/05/2024 0845   GLUCOSE 109 (H) 04/07/2024 1407   CALCIUM  8.0 (L) 04/07/2024 1407   AST 58 (H) 04/07/2024 1407   AST 67 (H) 04/05/2024 0845   ALT 27 04/07/2024 1407   ALT 27 04/05/2024 0845   ALKPHOS 2,065 (H) 04/07/2024 1407   BILITOT 1.3 (H) 04/07/2024 1407   BILITOT 0.8 04/05/2024 0845   PROT 7.0 04/07/2024 1407   ALBUMIN  2.3 (L) 04/07/2024 1407    RADIOGRAPHIC  STUDIES: DG Chest Port 1 View Result Date: 04/07/2024 CLINICAL DATA:  Bladder cancer, weakness. EXAM: PORTABLE CHEST 1 VIEW COMPARISON:  12/28/2023 and CT chest 01/26/2024. FINDINGS: Trachea is midline. Heart size normal. Right IJ power port tip is in the SVC. Nodular density is seen in the right upper lobe, superimposed with the chest port. Calcified granuloma in the right lower lobe. Lungs are otherwise clear. No pleural fluid. IMPRESSION: 1. New right upper lobe nodule, worrisome for metastatic disease. 2. Otherwise, no acute findings. Electronically Signed   By: [name] Eke M.D.   On: 04/07/2024  14:59   CT Head Wo Contrast Result Date: 04/07/2024 EXAM: CT HEAD WITHOUT CONTRAST 04/07/2024 02:38:54 PM TECHNIQUE: CT of the head was performed without the administration of intravenous contrast. Automated exposure control, iterative reconstruction, and/or weight based adjustment of the mA/kV was utilized to reduce the radiation dose to as low as reasonably achievable. COMPARISON: None available. CLINICAL HISTORY: Mental status change, unknown cause. Pt has stage four bladder cancer and is currently taking chemo treatments. Per wife pt has been talking gibberish since Friday. FINDINGS: BRAIN AND VENTRICLES: No acute hemorrhage. No evidence of acute infarct. No hydrocephalus. No extra-axial collection. No mass effect or midline shift. ORBITS: No acute abnormality. SINUSES: No acute abnormality. SOFT TISSUES AND SKULL: No acute soft tissue abnormality. No skull fracture. IMPRESSION: 1. No acute intracranial abnormality. Electronically signed by: Gilmore Molt MD 04/07/2024 02:45 PM EDT RP Workstation: [id number]    PERFORMANCE STATUS (ECOG) : 2 - Symptomatic, <50% confined to bed  Review of Systems Unless otherwise noted, a complete review of systems is negative.  Physical Exam General: NAD Cardiovascular: regular rate and rhythm Pulmonary: clear ant fields Abdomen: soft, nontender, + bowel  sounds GU: no suprapubic tenderness Extremities: no edema, no joint deformities Skin: no rashes Neurological: Weakness but otherwise nonfocal  IMPRESSION/PLAN: Urothelial cancer -single agent Keytruda .  Suspect that worsening symptom burden may be associated with his cancer versus treatment.  Discussed with Dr. Jacobo and no real value in repeating imaging at this time.  Symptomatic anemia -hemoglobin has been downtrending. Hg 7.7 today.  Will proceed with transfusion 1 unit PRBC tomorrow.  Thrombocytopenia -no active bleeding.  Continue to monitor CBC.  Depression/anxiety -start mirtazapine 7.5 mg nightly.  Hopefully, this will help with moods and appetite.  Dehydration -proceed with IV fluids today.  Labs/fluids/possible transfusion next week and MD follow-up in 2 weeks   Patient expressed understanding and was in agreement with this plan. He also understands that He can call clinic at any time with any questions, concerns, or complaints.   Thank you for allowing me to participate in the care of this very pleasant patient.   Time Total: 25 minutes  Visit consisted of counseling and education dealing with the complex and emotionally intense issues of symptom management in the setting of serious illness.Greater than 50%  of this time was spent counseling and coordinating care related to the above assessment and plan.  Signed by: Fonda Mower, PhD, NP-C

## 2024-04-08 NOTE — Telephone Encounter (Signed)
 Pt spouse called and wants to schedule a hospital follow up for pt.   Please advise and call pt spouse Donavon) back with an update.

## 2024-04-08 NOTE — Progress Notes (Deleted)
 Ryan Dixon presents today for phlebotomy per MD orders. Phlebotomy procedure started at *** and ended at ***. *** cc removed. Patient tolerated procedure well. IV needle removed intact.

## 2024-04-08 NOTE — Progress Notes (Signed)
 Patient wife states patient is confused and is not himself, and increased weakness.

## 2024-04-08 NOTE — Telephone Encounter (Signed)
 Patients wife left a voicemail requesting an appointment this week. Please call her to schedule. Thank you

## 2024-04-09 ENCOUNTER — Other Ambulatory Visit: Payer: Self-pay

## 2024-04-09 ENCOUNTER — Inpatient Hospital Stay

## 2024-04-09 ENCOUNTER — Other Ambulatory Visit: Payer: Self-pay | Admitting: *Deleted

## 2024-04-09 ENCOUNTER — Ambulatory Visit: Admitting: Dermatology

## 2024-04-09 ENCOUNTER — Telehealth: Payer: Self-pay | Admitting: *Deleted

## 2024-04-09 DIAGNOSIS — Z5112 Encounter for antineoplastic immunotherapy: Secondary | ICD-10-CM | POA: Diagnosis not present

## 2024-04-09 DIAGNOSIS — R300 Dysuria: Secondary | ICD-10-CM

## 2024-04-09 DIAGNOSIS — C679 Malignant neoplasm of bladder, unspecified: Secondary | ICD-10-CM

## 2024-04-09 LAB — URINALYSIS, COMPLETE (UACMP) WITH MICROSCOPIC
Bilirubin Urine: NEGATIVE
Glucose, UA: NEGATIVE mg/dL
Ketones, ur: NEGATIVE mg/dL
Nitrite: NEGATIVE
Protein, ur: 100 mg/dL — AB
RBC / HPF: >50 RBC/hpf (ref 0–5)
Specific Gravity, Urine: 1.014 (ref 1.005–1.030)
WBC, UA: >50 WBC/hpf (ref 0–5)
pH: 5 (ref 5.0–8.0)

## 2024-04-09 NOTE — Telephone Encounter (Signed)
 He states that his confusion is getting worse and she wants to know what else she can do

## 2024-04-10 ENCOUNTER — Other Ambulatory Visit: Payer: Self-pay

## 2024-04-10 ENCOUNTER — Inpatient Hospital Stay

## 2024-04-10 ENCOUNTER — Encounter (HOSPITAL_COMMUNITY): Payer: Self-pay

## 2024-04-10 ENCOUNTER — Encounter: Payer: Self-pay | Admitting: Student

## 2024-04-10 ENCOUNTER — Inpatient Hospital Stay: Admitting: Hospice and Palliative Medicine

## 2024-04-10 ENCOUNTER — Inpatient Hospital Stay
Admission: AD | Admit: 2024-04-10 | Discharge: 2024-04-13 | DRG: 689 | Disposition: A | Discharge status: Home / Self Care | Source: Ambulatory Visit | Attending: Family Medicine | Admitting: Family Medicine

## 2024-04-10 DIAGNOSIS — D631 Anemia in chronic kidney disease: Secondary | ICD-10-CM | POA: Diagnosis present

## 2024-04-10 DIAGNOSIS — G9341 Metabolic encephalopathy: Secondary | ICD-10-CM | POA: Diagnosis present

## 2024-04-10 DIAGNOSIS — G9340 Encephalopathy, unspecified: Secondary | ICD-10-CM | POA: Diagnosis present

## 2024-04-10 DIAGNOSIS — Z681 Body mass index (BMI) 19 or less, adult: Secondary | ICD-10-CM | POA: Diagnosis not present

## 2024-04-10 DIAGNOSIS — G893 Neoplasm related pain (acute) (chronic): Secondary | ICD-10-CM | POA: Diagnosis present

## 2024-04-10 DIAGNOSIS — E7800 Pure hypercholesterolemia, unspecified: Secondary | ICD-10-CM | POA: Diagnosis present

## 2024-04-10 DIAGNOSIS — C679 Malignant neoplasm of bladder, unspecified: Secondary | ICD-10-CM

## 2024-04-10 DIAGNOSIS — E1122 Type 2 diabetes mellitus with diabetic chronic kidney disease: Secondary | ICD-10-CM | POA: Diagnosis present

## 2024-04-10 DIAGNOSIS — Z1611 Resistance to penicillins: Secondary | ICD-10-CM | POA: Diagnosis present

## 2024-04-10 DIAGNOSIS — E43 Unspecified severe protein-calorie malnutrition: Secondary | ICD-10-CM | POA: Diagnosis present

## 2024-04-10 DIAGNOSIS — Z936 Other artificial openings of urinary tract status: Secondary | ICD-10-CM

## 2024-04-10 DIAGNOSIS — Z515 Encounter for palliative care: Secondary | ICD-10-CM

## 2024-04-10 DIAGNOSIS — C7951 Secondary malignant neoplasm of bone: Secondary | ICD-10-CM | POA: Diagnosis present

## 2024-04-10 DIAGNOSIS — Z888 Allergy status to other drugs, medicaments and biological substances status: Secondary | ICD-10-CM

## 2024-04-10 DIAGNOSIS — Z8551 Personal history of malignant neoplasm of bladder: Secondary | ICD-10-CM

## 2024-04-10 DIAGNOSIS — N3001 Acute cystitis with hematuria: Secondary | ICD-10-CM | POA: Diagnosis not present

## 2024-04-10 DIAGNOSIS — E872 Acidosis, unspecified: Secondary | ICD-10-CM | POA: Diagnosis present

## 2024-04-10 DIAGNOSIS — K5900 Constipation, unspecified: Secondary | ICD-10-CM | POA: Diagnosis present

## 2024-04-10 DIAGNOSIS — N390 Urinary tract infection, site not specified: Secondary | ICD-10-CM | POA: Diagnosis present

## 2024-04-10 DIAGNOSIS — D649 Anemia, unspecified: Secondary | ICD-10-CM

## 2024-04-10 DIAGNOSIS — L989 Disorder of the skin and subcutaneous tissue, unspecified: Secondary | ICD-10-CM | POA: Diagnosis present

## 2024-04-10 DIAGNOSIS — D696 Thrombocytopenia, unspecified: Secondary | ICD-10-CM | POA: Diagnosis present

## 2024-04-10 DIAGNOSIS — B961 Klebsiella pneumoniae [K. pneumoniae] as the cause of diseases classified elsewhere: Secondary | ICD-10-CM | POA: Diagnosis present

## 2024-04-10 DIAGNOSIS — R7401 Elevation of levels of liver transaminase levels: Secondary | ICD-10-CM | POA: Diagnosis present

## 2024-04-10 DIAGNOSIS — Z1629 Resistance to other single specified antibiotic: Secondary | ICD-10-CM | POA: Diagnosis present

## 2024-04-10 DIAGNOSIS — D630 Anemia in neoplastic disease: Secondary | ICD-10-CM | POA: Diagnosis present

## 2024-04-10 DIAGNOSIS — N1832 Chronic kidney disease, stage 3b: Secondary | ICD-10-CM | POA: Diagnosis present

## 2024-04-10 DIAGNOSIS — F17210 Nicotine dependence, cigarettes, uncomplicated: Secondary | ICD-10-CM | POA: Diagnosis present

## 2024-04-10 DIAGNOSIS — B964 Proteus (mirabilis) (morganii) as the cause of diseases classified elsewhere: Secondary | ICD-10-CM | POA: Diagnosis present

## 2024-04-10 DIAGNOSIS — Z79899 Other long term (current) drug therapy: Secondary | ICD-10-CM

## 2024-04-10 DIAGNOSIS — J438 Other emphysema: Secondary | ICD-10-CM | POA: Diagnosis present

## 2024-04-10 DIAGNOSIS — F32A Depression, unspecified: Secondary | ICD-10-CM | POA: Diagnosis present

## 2024-04-10 DIAGNOSIS — Z886 Allergy status to analgesic agent status: Secondary | ICD-10-CM

## 2024-04-10 DIAGNOSIS — R4781 Slurred speech: Secondary | ICD-10-CM | POA: Diagnosis present

## 2024-04-10 DIAGNOSIS — Z5112 Encounter for antineoplastic immunotherapy: Secondary | ICD-10-CM | POA: Diagnosis not present

## 2024-04-10 DIAGNOSIS — M542 Cervicalgia: Secondary | ICD-10-CM | POA: Diagnosis present

## 2024-04-10 DIAGNOSIS — Z8615 Personal history of latent tuberculosis infection: Secondary | ICD-10-CM

## 2024-04-10 DIAGNOSIS — I959 Hypotension, unspecified: Secondary | ICD-10-CM | POA: Diagnosis present

## 2024-04-10 DIAGNOSIS — I129 Hypertensive chronic kidney disease with stage 1 through stage 4 chronic kidney disease, or unspecified chronic kidney disease: Secondary | ICD-10-CM | POA: Diagnosis present

## 2024-04-10 LAB — CBC WITH DIFFERENTIAL/PLATELET
Abs Immature Granulocytes: 1.84 K/uL — ABNORMAL HIGH (ref 0.00–0.07)
Basophils Absolute: 0.1 K/uL (ref 0.0–0.1)
Basophils Relative: 1 %
Eosinophils Absolute: 0.1 K/uL (ref 0.0–0.5)
Eosinophils Relative: 1 %
HCT: 23.9 % — ABNORMAL LOW (ref 39.0–52.0)
Hemoglobin: 7.8 g/dL — ABNORMAL LOW (ref 13.0–17.0)
Immature Granulocytes: 19 %
Lymphocytes Relative: 13 %
Lymphs Abs: 1.3 K/uL (ref 0.7–4.0)
MCH: 30.1 pg (ref 26.0–34.0)
MCHC: 32.6 g/dL (ref 30.0–36.0)
MCV: 92.3 fL (ref 80.0–100.0)
Monocytes Absolute: 0.5 K/uL (ref 0.1–1.0)
Monocytes Relative: 5 %
Neutro Abs: 6.2 K/uL (ref 1.7–7.7)
Neutrophils Relative %: 61 %
Platelets: 35 K/uL — ABNORMAL LOW (ref 150–400)
RBC: 2.59 MIL/uL — ABNORMAL LOW (ref 4.22–5.81)
RDW: 16.8 % — ABNORMAL HIGH (ref 11.5–15.5)
WBC: 10 K/uL (ref 4.0–10.5)
nRBC: 1.7 % — ABNORMAL HIGH (ref 0.0–0.2)

## 2024-04-10 LAB — BASIC METABOLIC PANEL WITH GFR
Anion gap: 12 (ref 5–15)
BUN: 43 mg/dL — ABNORMAL HIGH (ref 8–23)
CO2: 18 mmol/L — ABNORMAL LOW (ref 22–32)
Calcium: 7.8 mg/dL — ABNORMAL LOW (ref 8.9–10.3)
Chloride: 106 mmol/L (ref 98–111)
Creatinine, Ser: 1.89 mg/dL — ABNORMAL HIGH (ref 0.61–1.24)
GFR, Estimated: 39 mL/min — ABNORMAL LOW (ref >60)
Glucose, Bld: 82 mg/dL (ref 70–99)
Potassium: 4.4 mmol/L (ref 3.5–5.1)
Sodium: 136 mmol/L (ref 135–145)

## 2024-04-10 LAB — URINALYSIS, COMPLETE (UACMP) WITH MICROSCOPIC
Bilirubin Urine: NEGATIVE
Glucose, UA: NEGATIVE mg/dL
Ketones, ur: NEGATIVE mg/dL
Nitrite: POSITIVE — AB
Protein, ur: 100 mg/dL — AB
Specific Gravity, Urine: 1.015 (ref 1.005–1.030)
WBC, UA: >50 WBC/hpf (ref 0–5)
pH: 7 (ref 5.0–8.0)

## 2024-04-10 LAB — CBC
HCT: 26.1 % — ABNORMAL LOW (ref 39.0–52.0)
Hemoglobin: 8.4 g/dL — ABNORMAL LOW (ref 13.0–17.0)
MCH: 28.7 pg (ref 26.0–34.0)
MCHC: 32.2 g/dL (ref 30.0–36.0)
MCV: 89.1 fL (ref 80.0–100.0)
Platelets: 27 K/uL — CL (ref 150–400)
RBC: 2.93 MIL/uL — ABNORMAL LOW (ref 4.22–5.81)
RDW: 18.4 % — ABNORMAL HIGH (ref 11.5–15.5)
WBC: 8.4 K/uL (ref 4.0–10.5)
nRBC: 1 % — ABNORMAL HIGH (ref 0.0–0.2)

## 2024-04-10 LAB — URINE CULTURE: Culture: >=100000 — AB

## 2024-04-10 LAB — CMP (CANCER CENTER ONLY)
ALT: 30 U/L (ref 0–44)
AST: 63 U/L — ABNORMAL HIGH (ref 15–41)
Albumin: 2.2 g/dL — ABNORMAL LOW (ref 3.5–5.0)
Alkaline Phosphatase: 1894 U/L — ABNORMAL HIGH (ref 38–126)
Anion gap: 13 (ref 5–15)
BUN: 40 mg/dL — ABNORMAL HIGH (ref 8–23)
CO2: 16 mmol/L — ABNORMAL LOW (ref 22–32)
Calcium: 8 mg/dL — ABNORMAL LOW (ref 8.9–10.3)
Chloride: 108 mmol/L (ref 98–111)
Creatinine: 1.79 mg/dL — ABNORMAL HIGH (ref 0.61–1.24)
GFR, Estimated: 41 mL/min — ABNORMAL LOW (ref >60)
Glucose, Bld: 100 mg/dL — ABNORMAL HIGH (ref 70–99)
Potassium: 4.3 mmol/L (ref 3.5–5.1)
Sodium: 137 mmol/L (ref 135–145)
Total Bilirubin: 1.3 mg/dL — ABNORMAL HIGH (ref 0.0–1.2)
Total Protein: 6.1 g/dL — ABNORMAL LOW (ref 6.5–8.1)

## 2024-04-10 LAB — PREPARE RBC (CROSSMATCH)

## 2024-04-10 LAB — MAGNESIUM: Magnesium: 2.3 mg/dL (ref 1.7–2.4)

## 2024-04-10 LAB — PHOSPHORUS: Phosphorus: 3.8 mg/dL (ref 2.5–4.6)

## 2024-04-10 MED ORDER — POLYETHYLENE GLYCOL 3350 17 G PO PACK
17.0000 g | PACK | Freq: Two times a day (BID) | ORAL | Status: DC
  Administered 2024-04-10 (×2): 17 g via ORAL
  Filled 2024-04-10 (×4): qty 1

## 2024-04-10 MED ORDER — SODIUM BICARBONATE 8.4 % IV SOLN
INTRAVENOUS | Status: AC
  Filled 2024-04-10: qty 1000
  Filled 2024-04-10: qty 150

## 2024-04-10 MED ORDER — SODIUM CHLORIDE 0.9% IV SOLUTION
250.0000 mL | INTRAVENOUS | Status: DC
  Administered 2024-04-10: 250 mL via INTRAVENOUS
  Filled 2024-04-10: qty 250

## 2024-04-10 MED ORDER — SODIUM CHLORIDE 0.9% FLUSH
3.0000 mL | Freq: Two times a day (BID) | INTRAVENOUS | Status: DC
  Administered 2024-04-10 – 2024-04-13 (×6): 3 mL via INTRAVENOUS

## 2024-04-10 MED ORDER — ACETAMINOPHEN 650 MG RE SUPP: 650.0000 mg | Freq: Four times a day (QID) | RECTAL | Status: DC | PRN

## 2024-04-10 MED ORDER — HEPARIN SOD (PORK) LOCK FLUSH 100 UNIT/ML IV SOLN: 500.0000 [IU] | Freq: Every day | INTRAVENOUS | Status: DC | PRN

## 2024-04-10 MED ORDER — TRAMADOL HCL 50 MG PO TABS
100.0000 mg | ORAL_TABLET | Freq: Four times a day (QID) | ORAL | Status: DC | PRN
  Administered 2024-04-11: 100 mg via ORAL
  Filled 2024-04-10: qty 2

## 2024-04-10 MED ORDER — SODIUM CHLORIDE 0.9 % IV SOLN
2.0000 g | INTRAVENOUS | Status: DC
  Administered 2024-04-10 – 2024-04-12 (×3): 2 g via INTRAVENOUS
  Filled 2024-04-10 (×4): qty 20

## 2024-04-10 MED ORDER — ENSURE PLUS HIGH PROTEIN PO LIQD: 237.0000 mL | Freq: Two times a day (BID) | ORAL | Status: DC

## 2024-04-10 MED ORDER — SODIUM CHLORIDE 0.9% FLUSH: 10.0000 mL | INTRAVENOUS | Status: DC | PRN

## 2024-04-10 MED ORDER — SODIUM CHLORIDE 0.9% FLUSH
10.0000 mL | Freq: Two times a day (BID) | INTRAVENOUS | Status: DC
  Administered 2024-04-10 – 2024-04-13 (×7): 10 mL

## 2024-04-10 MED ORDER — HEPARIN SOD (PORK) LOCK FLUSH 100 UNIT/ML IV SOLN: 250.0000 [IU] | INTRAVENOUS | Status: DC | PRN

## 2024-04-10 MED ORDER — BISACODYL 5 MG PO TBEC
10.0000 mg | DELAYED_RELEASE_TABLET | Freq: Every day | ORAL | Status: DC
  Administered 2024-04-11 – 2024-04-12 (×2): 10 mg via ORAL
  Filled 2024-04-10 (×2): qty 2

## 2024-04-10 MED ORDER — CHLORHEXIDINE GLUCONATE CLOTH 2 % EX PADS
6.0000 | MEDICATED_PAD | Freq: Every day | CUTANEOUS | Status: DC
  Administered 2024-04-10 – 2024-04-13 (×4): 6 via TOPICAL

## 2024-04-10 MED ORDER — SODIUM CHLORIDE 0.9% FLUSH: 3.0000 mL | INTRAVENOUS | Status: DC | PRN

## 2024-04-10 MED ORDER — ONDANSETRON HCL 4 MG PO TABS: 4.0000 mg | ORAL_TABLET | Freq: Four times a day (QID) | ORAL | Status: DC | PRN

## 2024-04-10 MED ORDER — HYDROCERIN EX CREA
TOPICAL_CREAM | Freq: Two times a day (BID) | CUTANEOUS | Status: DC
  Filled 2024-04-10: qty 113

## 2024-04-10 MED ORDER — MORPHINE SULFATE (PF) 2 MG/ML IV SOLN
2.0000 mg | Freq: Once | INTRAVENOUS | Status: AC
  Administered 2024-04-10: 2 mg via INTRAVENOUS
  Filled 2024-04-10: qty 1

## 2024-04-10 MED ORDER — ACETAMINOPHEN 325 MG PO TABS
650.0000 mg | ORAL_TABLET | Freq: Once | ORAL | Status: AC
  Administered 2024-04-10: 650 mg via ORAL
  Filled 2024-04-10: qty 2

## 2024-04-10 MED ORDER — ALBUTEROL SULFATE (2.5 MG/3ML) 0.083% IN NEBU
2.5000 mg | INHALATION_SOLUTION | Freq: Four times a day (QID) | RESPIRATORY_TRACT | Status: DC | PRN
  Filled 2024-04-10: qty 3

## 2024-04-10 MED ORDER — MIRTAZAPINE 15 MG PO TABS
7.5000 mg | ORAL_TABLET | Freq: Every day | ORAL | Status: DC
  Administered 2024-04-10 – 2024-04-12 (×3): 7.5 mg via ORAL
  Filled 2024-04-10 (×3): qty 1

## 2024-04-10 MED ORDER — BISACODYL 10 MG RE SUPP: 10.0000 mg | Freq: Every day | RECTAL | Status: DC | PRN

## 2024-04-10 MED ORDER — BISACODYL 5 MG PO TBEC
10.0000 mg | DELAYED_RELEASE_TABLET | Freq: Once | ORAL | Status: AC
  Administered 2024-04-10: 10 mg via ORAL
  Filled 2024-04-10: qty 2

## 2024-04-10 MED ORDER — MEGESTROL ACETATE 20 MG PO TABS
40.0000 mg | ORAL_TABLET | Freq: Every day | ORAL | Status: DC
  Administered 2024-04-11: 40 mg via ORAL
  Filled 2024-04-10: qty 2

## 2024-04-10 MED ORDER — ACETAMINOPHEN 325 MG PO TABS: 650.0000 mg | ORAL_TABLET | Freq: Four times a day (QID) | ORAL | Status: DC | PRN

## 2024-04-10 MED ORDER — MIDODRINE HCL 5 MG PO TABS
5.0000 mg | ORAL_TABLET | Freq: Three times a day (TID) | ORAL | Status: DC
  Administered 2024-04-10 – 2024-04-13 (×9): 5 mg via ORAL
  Filled 2024-04-10 (×9): qty 1

## 2024-04-10 MED ORDER — SODIUM CHLORIDE 0.9 % IV SOLN: 250.0000 mL | INTRAVENOUS | Status: AC | PRN

## 2024-04-10 MED ORDER — IOHEXOL 9 MG/ML PO SOLN
500.0000 mL | ORAL | Status: AC
  Administered 2024-04-10 (×2): 500 mL via ORAL

## 2024-04-10 MED ORDER — ONDANSETRON HCL 4 MG/2ML IJ SOLN: 4.0000 mg | Freq: Four times a day (QID) | INTRAMUSCULAR | Status: DC | PRN

## 2024-04-10 NOTE — Progress Notes (Signed)
 Palliative Medicine [facility] at [facility] [phone] [phone]   Name: Ryan Dixon Date: 04/10/2024 MRN: [id number]  DOB: 1955/07/21  Patient Care Team: [name], MD as PCP - General (Family Medicine) [name], MD as Consulting Physician (Oncology)    REASON FOR CONSULTATION: Ryan Dixon is a 68 y.o. 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  Palliative care was consulted to address goals.  SOCIAL HISTORY:     reports that he has been smoking cigarettes. He has a 86 pack-year smoking history. He has never used smokeless tobacco. He reports current alcohol use. He reports that he does not use drugs.  Patient is married and lives at home with his wife.  He still works for [employer].   ADVANCE DIRECTIVES:  Not on file  CODE STATUS: Full code  PAST MEDICAL HISTORY: Past Medical History:  Diagnosis Date   Aortic atherosclerosis    Chronic foot pain, left    History of hematuria    Hypertension    Other emphysema (HCC)    Other male erectile dysfunction    Pre-diabetes    Pure hypercholesterolemia    Tobacco dependence     PAST SURGICAL HISTORY:  Past Surgical History:  Procedure Laterality Date   ANKLE ARTHROSCOPY Left 08/11/2020   Procedure: LEFT ANKLE ARTHROSCOPY AND DEBRIDEMENT;  Surgeon: [name], MD;  Location: [facility];  Service: Orthopedics;  Laterality: Left;   BLADDER INSTILLATION N/A 11/29/2022   Procedure: BLADDER INSTILLATION OF GEMCITABINE ;  Surgeon: [name], MD;  Location: [facility];  Service: Urology;  Laterality: N/A;   COLONOSCOPY     CYSTOSCOPY WITH INJECTION N/A 07/07/2023   Procedure: CYSTOSCOPY WITH INJECTION;  Surgeon: [name], MD;  Location: [facility];  Service: Urology;  Laterality: N/A;   IR IMAGING GUIDED  PORT INSERTION  01/20/2023   PELVIC LYMPH NODE DISSECTION N/A 07/07/2023   Procedure: PELVIC LYMPH NODE DISSECTION;  Surgeon: [name], MD;  Location: [facility];  Service: Urology;  Laterality: N/A;   ROBOT ASSISTED LAPAROSCOPIC COMPLETE CYSTECT ILEAL CONDUIT N/A 07/07/2023   Procedure: XI ROBOTIC ASSISTED LAPAROSCOPIC COMPLETE CYSTECT ILEAL CONDUIT, UMBILICAL HERNIA REPAIR, BILATERAL INGUINAL HERNIA REPAIR;  Surgeon: [name], MD;  Location: [facility];  Service: Urology;  Laterality: N/A;   ROBOT ASSISTED LAPAROSCOPIC RADICAL PROSTATECTOMY N/A 07/07/2023   Procedure: XI ROBOTIC ASSISTED LAPAROSCOPIC RADICAL PROSTATECTOMY;  Surgeon: [name], MD;  Location: [facility];  Service: Urology;  Laterality: N/A;   TONSILLECTOMY     TRANSURETHRAL RESECTION OF BLADDER TUMOR N/A 11/29/2022   Procedure: TRANSURETHRAL RESECTION OF BLADDER TUMOR (TURBT);  Surgeon: [name], MD;  Location: [facility];  Service: Urology;  Laterality: N/A;    HEMATOLOGY/ONCOLOGY HISTORY:  Oncology History  Bladder cancer (HCC)  10/25/2022 Initial Diagnosis   Seen by Dr. Twylla for intermittent hematuria since February 2024 along with other urinary symptoms.   10/28/2022 Imaging   CT hematuria workup Showed 2.2 cm bladder mass worrisome for cancer.  Adjacent mild diffuse bladder wall thickening.  Based on the location this may involve the extreme distal aspect of the right UVJ.  Extensive atherosclerotic changes with the occlusion of the right common iliac artery and areas of significant stenosis along the left. Please correlate with any symptoms such as claudication and further workup as  clinically appropriate.  CT chest with contrast IMPRESSION: 1. Lung-RADS 2, benign appearance or behavior. Continue annual screening with low-dose chest CT without contrast in 12 months. 2. Similar ascending aortic dilatation at 4.2 cm. Recommend attention on follow-up lung cancer screening CT in 1 year.    11/29/2022 Procedure   Intraoperative findings:  Cystoscopy-urethra normal in caliber without stricture.  Prostate with mild lateral lobe enlargement and moderate bladder neck elevation.  UOs normal-appearing bilaterally.  The right UO was anterior to the bladder tumor Bladder tumor: 3 cm papillary/nodular tumor superior to right UO and extending laterally; ~ 1 cm papillary tumor anterior bladder neck 10:00; 2-1 cm papillary tumors anterior bladder neck 2:00    11/29/2022 Pathology Results        12/07/2022 Cancer Staging   Staging form: Urinary Bladder, AJCC 8th Edition - Clinical stage from 12/07/2022: Stage II (cT2, cN0, cM0) - Signed by Clista Bimler, MD on 01/02/2023 Stage prefix: Initial diagnosis WHO/ISUP grade (low/high): High Grade Histologic grading system: 2 grade system   01/02/2023 Initial Diagnosis   Bladder cancer (HCC)   01/23/2023 - 04/06/2023 Chemotherapy   Patient is on Treatment Plan : BLADDER DOSE DENSE MVAC q14d     07/07/2023 Cancer Staging   Staging form: Urinary Bladder, AJCC 8th Edition - Pathologic stage from 07/07/2023: Stage IIIB (ypT2a, pN2, cM0) - Signed by Clista Bimler, MD on 08/31/2023 Stage prefix: Post-therapy Response to neoadjuvant therapy: Partial response WHO/ISUP grade (low/high): High Grade Histologic grading system: 2 grade system   08/31/2023 - 09/26/2023 Chemotherapy   Patient is on Treatment Plan : BLADDER Nivolumab  (240) q14d     02/23/2024 - 03/15/2024 Chemotherapy   Patient is on Treatment Plan : UROTHELIAL ADVANCED, METASTATIC ENFORTUMAB D1, D8 + PEMBROLIZUMAB  (200) D1 Q21D     04/05/2024 -  Chemotherapy   Patient is on Treatment Plan : BLADDER Pembrolizumab  (200) q21d       ALLERGIES:  is allergic to nsaids and other.  MEDICATIONS:  Current Outpatient Medications  Medication Sig Dispense Refill   albuterol  (VENTOLIN  HFA) 108 (90 Base) MCG/ACT inhaler Inhale 2 puffs into the lungs every 6 (six) hours as needed for wheezing or  shortness of breath. 8 g 2   atorvastatin  (LIPITOR) 20 MG tablet TAKE 1 TABLET BY MOUTH EVERY DAY AT NIGHT (Patient not taking: Reported on 04/08/2024)     Betamethasone  Dipropionate (SERNIVO ) 0.05 % EMUL Apply topically twice daily to itchy rash as needed. Avoid face, groin, axilla (Patient not taking: Reported on 04/08/2024) 120 mL 4   hydrochlorothiazide (HYDRODIURIL) 25 MG tablet Take 25 mg by mouth daily. (Patient not taking: Reported on 04/08/2024)     megestrol (MEGACE) 40 MG tablet Take 1 tablet (40 mg total) by mouth daily. 30 tablet 3   mirtazapine (REMERON) 7.5 MG tablet Take 1 tablet (7.5 mg total) by mouth at bedtime. 30 tablet 0   mupirocin  ointment (BACTROBAN ) 2 % Apply topically to wound at right thigh twice daily and cover until healed (Patient not taking: Reported on 04/08/2024) 30 g 1   oxyCODONE  (ROXICODONE ) 5 MG immediate release tablet Take 1 tablet (5 mg total) by mouth every 6 (six) hours as needed for moderate pain (pain score 4-6) or severe pain (pain score 7-10) (post-operatively). (Patient not taking: Reported on 04/08/2024) 15 tablet 0   senna-docusate (SENOKOT-S) 8.6-50 MG tablet Take 1 tablet by mouth 2 (two) times daily. While taking strong pain meds to prevent constipation. 10 tablet 0   traMADol  (ULTRAM )  50 MG tablet Take 2 tablets (100 mg total) by mouth every 6 (six) hours as needed. 120 tablet 2   triamcinolone  ointment (KENALOG ) 0.5 % Apply 1 Application topically 2 (two) times daily. (Patient not taking: Reported on 04/08/2024) 30 g 0   varenicline  (CHANTIX ) 1 MG tablet Take 1 mg by mouth 2 (two) times daily. (Patient not taking: Reported on 04/08/2024)     Wheat Dextrin (BENEFIBER) POWD Take 0.5 Scoops by mouth daily.     No current facility-administered medications for this visit.   Facility-Administered Medications Ordered in Other Visits  Medication Dose Route Frequency Provider Last Rate Last Admin   0.9 %  sodium chloride  infusion (Manually program via  Guardrails IV Fluids)  250 mL Intravenous Continuous Finnegan, Timothy J, MD 10 mL/hr at 04/10/24 0942 250 mL at 04/10/24 0942   heparin  lock flush 100 unit/mL  500 Units Intravenous Once Agrawal, Kavita, MD        VITAL SIGNS: There were no vitals taken for this visit. There were no vitals filed for this visit.  Estimated body mass index is 18.14 kg/m as calculated from the following:   Height as of 04/07/24: 5' 10 (1.778 m).   Weight as of 04/08/24: 126 lb 6.4 oz (57.3 kg).  LABS: CBC:    Component Value Date/Time   WBC 10.0 04/10/2024 0750   HGB 7.8 (L) 04/10/2024 0750   HGB 7.7 (L) 04/08/2024 1034   HCT 23.9 (L) 04/10/2024 0750   PLT 35 (L) 04/10/2024 0750   PLT 38 (L) 04/08/2024 1034   MCV 92.3 04/10/2024 0750   NEUTROABS 6.2 04/10/2024 0750   LYMPHSABS 1.3 04/10/2024 0750   MONOABS 0.5 04/10/2024 0750   EOSABS 0.1 04/10/2024 0750   BASOSABS 0.1 04/10/2024 0750   Comprehensive Metabolic Panel:    Component Value Date/Time   NA 137 04/10/2024 0750   K 4.3 04/10/2024 0750   CL 108 04/10/2024 0750   CO2 16 (L) 04/10/2024 0750   BUN 40 (H) 04/10/2024 0750   CREATININE 1.79 (H) 04/10/2024 0750   GLUCOSE 100 (H) 04/10/2024 0750   CALCIUM  8.0 (L) 04/10/2024 0750   AST 63 (H) 04/10/2024 0750   ALT 30 04/10/2024 0750   ALKPHOS 1,894 (H) 04/10/2024 0750   BILITOT 1.3 (H) 04/10/2024 0750   PROT 6.1 (L) 04/10/2024 0750   ALBUMIN  2.2 (L) 04/10/2024 0750    RADIOGRAPHIC STUDIES: DG Chest Port 1 View Result Date: 04/07/2024 CLINICAL DATA:  Bladder cancer, weakness. EXAM: PORTABLE CHEST 1 VIEW COMPARISON:  12/28/2023 and CT chest 01/26/2024. FINDINGS: Trachea is midline. Heart size normal. Right IJ power port tip is in the SVC. Nodular density is seen in the right upper lobe, superimposed with the chest port. Calcified granuloma in the right lower lobe. Lungs are otherwise clear. No pleural fluid. IMPRESSION: 1. New right upper lobe nodule, worrisome for metastatic disease.  2. Otherwise, no acute findings. Electronically Signed   By: [name] Eke M.D.   On: 04/07/2024 14:59   CT Head Wo Contrast Result Date: 04/07/2024 EXAM: CT HEAD WITHOUT CONTRAST 04/07/2024 02:38:54 PM TECHNIQUE: CT of the head was performed without the administration of intravenous contrast. Automated exposure control, iterative reconstruction, and/or weight based adjustment of the mA/kV was utilized to reduce the radiation dose to as low as reasonably achievable. COMPARISON: None available. CLINICAL HISTORY: Mental status change, unknown cause. Pt has stage four bladder cancer and is currently taking chemo treatments. Per wife pt has been talking gibberish since  Friday. FINDINGS: BRAIN AND VENTRICLES: No acute hemorrhage. No evidence of acute infarct. No hydrocephalus. No extra-axial collection. No mass effect or midline shift. ORBITS: No acute abnormality. SINUSES: No acute abnormality. SOFT TISSUES AND SKULL: No acute soft tissue abnormality. No skull fracture. IMPRESSION: 1. No acute intracranial abnormality. Electronically signed by: Gilmore Molt MD 04/07/2024 02:45 PM EDT RP Workstation: [id number]    PERFORMANCE STATUS (ECOG) : 2 - Symptomatic, <50% confined to bed  Review of Systems Unless otherwise noted, a complete review of systems is negative.  Physical Exam General: NAD Cardiovascular: regular rate and rhythm Pulmonary: clear ant fields Abdomen: soft, nontender, + bowel sounds GU: no suprapubic tenderness Extremities: no edema, no joint deformities Skin: no rashes Neurological: Weakness but otherwise nonfocal  IMPRESSION: Follow-up visit.  Patient still with intermittent confusion.  Urine culture positive for Klebsiella.  Patient saw Dr. Jacobo and is being direct admitted for IV antibiotics.  Plan is also to obtain CTs for evaluation of cancer.  Patient seen in infusion while awaiting transfusion.  Plan is for direct admission following transfusion.  Patient  verbalized agreement with current scope of treatment.  He understands additional conversations will likely be required following imaging.    Patient says he has previously completed advance directives.  I recommended that he bring those in to be scanned in the chart.  He says he would like to remain a full code for now but would not want to be sustained on machines long-term.  PLAN: - Continue current scope of treatment - Full code - Patient awaiting direct admission   Patient expressed understanding and was in agreement with this plan. He also understands that He can call the clinic at any time with any questions, concerns, or complaints.     Time Total: 20 minutes  Visit consisted of counseling and education dealing with the complex and emotionally intense issues of symptom management and palliative care in the setting of serious and potentially life-threatening illness.Greater than 50%  of this time was spent counseling and coordinating care related to the above assessment and plan.  Signed by: Fonda Mower, PhD, NP-C

## 2024-04-10 NOTE — H&P (Signed)
 [facility] History and Physical   Patient: Ryan Dixon FMW:[id number]   PCP: [name], MD DOB: 1956-04-20   DOA: 04/10/2024   DOS: 04/10/2024   DOS: the patient was seen and examined on 04/10/2024  Patient coming from: The patient is coming from Home  Chief Complaint: Confusion and worsening of pain, found to have UTI, sent from oncology office for [facility] admission for antibiotics.  HPI: KAYSON TASKER is a 68 y.o. 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  As per patient he is getting intermittent confusion since Sunday, pain in the neck.  Oncology patient failed outpatient treatment for UTI, may need IV antibiotics and further workup as an inpatient. Patient is AO x 3, no confusion at this time, neck pain is 4/10, patient received morphine at the cancer center, pain is under control now.  Patient takes tramadol  at home.  C/o constipation, last BM was 2 weeks ago.  No abdominal pain.  Denies any other complaints.  ED Course: Direct admit, see consult note. VS temp 98.2, pulse 85, RR 18, BP 102/60, 100% on RA Inpatient labs pending Labs reviewed from cancer center done early morning. BMP CO2 16, acidosis, BUN 40/1.7 and creatinine Alk phos 1894 elevated due to metastatic disease Albumin  2.2, malnutrition. AST 63 slightly elevated, ALT 30 WNL Hb 7.8, platelet 35, thrombocytopenia.  WBC WNL UA LE large, nitrate negative, bacteria rare, WBC >50  10/26 urine culture growing Klebsiella and Proteus, mostly sensitive 10/28 urine culture growing GNR, follow complete report 10/29 follow repeat urine culture, noted today  Review of Systems: as mentioned in the history of present illness.  All other systems reviewed and are negative.  Past Medical History:  Diagnosis Date   Aortic  atherosclerosis    Chronic foot pain, left    History of hematuria    Hypertension    Other emphysema (HCC)    Other male erectile dysfunction    Pre-diabetes    Pure hypercholesterolemia    Tobacco dependence    Past Surgical History:  Procedure Laterality Date   ANKLE ARTHROSCOPY Left 08/11/2020   Procedure: LEFT ANKLE ARTHROSCOPY AND DEBRIDEMENT;  Surgeon: [name], MD;  Location: [facility];  Service: Orthopedics;  Laterality: Left;   BLADDER INSTILLATION N/A 11/29/2022   Procedure: BLADDER INSTILLATION OF GEMCITABINE ;  Surgeon: [name], MD;  Location: [facility];  Service: Urology;  Laterality: N/A;   COLONOSCOPY     CYSTOSCOPY WITH INJECTION N/A 07/07/2023   Procedure: CYSTOSCOPY WITH INJECTION;  Surgeon: [name], MD;  Location: [facility];  Service: Urology;  Laterality: N/A;   IR IMAGING GUIDED PORT INSERTION  01/20/2023   PELVIC LYMPH NODE DISSECTION N/A 07/07/2023   Procedure: PELVIC LYMPH NODE DISSECTION;  Surgeon: [name], MD;  Location: [facility];  Service: Urology;  Laterality: N/A;   ROBOT ASSISTED LAPAROSCOPIC COMPLETE CYSTECT ILEAL CONDUIT N/A 07/07/2023   Procedure: XI ROBOTIC ASSISTED LAPAROSCOPIC COMPLETE CYSTECT ILEAL CONDUIT, UMBILICAL HERNIA REPAIR, BILATERAL INGUINAL HERNIA REPAIR;  Surgeon: [name], MD;  Location: [facility];  Service: Urology;  Laterality: N/A;   ROBOT ASSISTED LAPAROSCOPIC RADICAL PROSTATECTOMY N/A 07/07/2023   Procedure: XI ROBOTIC ASSISTED LAPAROSCOPIC RADICAL PROSTATECTOMY;  Surgeon: [name], MD;  Location: [facility];  Service: Urology;  Laterality:  N/A;   TONSILLECTOMY     TRANSURETHRAL RESECTION OF BLADDER TUMOR N/A 11/29/2022   Procedure: TRANSURETHRAL RESECTION OF BLADDER TUMOR (TURBT);  Surgeon: [name], MD;  Location: [facility];  Service: Urology;  Laterality: N/A;   Social History:  reports that he has been smoking cigarettes. He has a 86 pack-year smoking history.  He has never used smokeless tobacco. He reports current alcohol use. He reports that he does not use drugs.  Allergies  Allergen Reactions   Nsaids Other (See Comments)    GI upset   Other Other (See Comments)    Anti-inflammatories cause GI upset, N/V     Family history reviewed and not pertinent No family history on file.   Prior to Admission medications   Medication Sig Start Date End Date Taking? Authorizing Provider  albuterol  (VENTOLIN  HFA) 108 (90 Base) MCG/ACT inhaler Inhale 2 puffs into the lungs every 6 (six) hours as needed for wheezing or shortness of breath. 12/28/23   Malvina Alm DASEN, MD  atorvastatin  (LIPITOR) 20 MG tablet TAKE 1 TABLET BY MOUTH EVERY DAY AT NIGHT Patient not taking: Reported on 04/08/2024 06/27/22   [provider]  Betamethasone  Dipropionate (SERNIVO ) 0.05 % EMUL Apply topically twice daily to itchy rash as needed. Avoid face, groin, axilla Patient not taking: Reported on 04/08/2024 10/12/23   Hester Alm BROCKS, MD  hydrochlorothiazide (HYDRODIURIL) 25 MG tablet Take 25 mg by mouth daily. Patient not taking: Reported on 04/08/2024 03/23/20   [provider]  megestrol (MEGACE) 40 MG tablet Take 1 tablet (40 mg total) by mouth daily. 04/05/24   [name], MD  mirtazapine (REMERON) 7.5 MG tablet Take 1 tablet (7.5 mg total) by mouth at bedtime. 04/08/24   Borders, Fonda SAUNDERS, NP  mupirocin  ointment (BACTROBAN ) 2 % Apply topically to wound at right thigh twice daily and cover until healed Patient not taking: Reported on 04/08/2024 10/10/23   Hester Alm BROCKS, MD  oxyCODONE  (ROXICODONE ) 5 MG immediate release tablet Take 1 tablet (5 mg total) by mouth every 6 (six) hours as needed for moderate pain (pain score 4-6) or severe pain (pain score 7-10) (post-operatively). Patient not taking: Reported on 04/08/2024 07/12/23 07/11/24  [name], MD  senna-docusate (SENOKOT-S) 8.6-50 MG tablet Take 1 tablet by mouth 2 (two) times  daily. While taking strong pain meds to prevent constipation. 07/12/23   [name], MD  traMADol  (ULTRAM ) 50 MG tablet Take 2 tablets (100 mg total) by mouth every 6 (six) hours as needed. 04/05/24   [name], MD  triamcinolone  ointment (KENALOG ) 0.5 % Apply 1 Application topically 2 (two) times daily. Patient not taking: Reported on 04/08/2024 09/26/23   Agrawal, Kavita, MD  varenicline  (CHANTIX ) 1 MG tablet Take 1 mg by mouth 2 (two) times daily. Patient not taking: Reported on 04/08/2024    [provider]  Wheat Dextrin (BENEFIBER) POWD Take 0.5 Scoops by mouth daily.    [provider]    Physical Exam: Vitals:   04/10/24 1225  BP: 102/60  Pulse: 85  Resp: 18  Temp: 98.2 F (36.8 C)  TempSrc: Oral  SpO2: 100%    General: alert and oriented to time, place, and person. Appear in mild distress, affect appropriate Eyes: PERRLA, Conjunctiva normal ENT: Oral Mucosa Clear, moist  Neck: no JVD, no Abnormal Mass Or lumps Cardiovascular: S1 and S2 Present, no Murmur, peripheral pulses symmetrical Respiratory: good respiratory effort, Bilateral Air entry equal and  Decreased, no signs of accessory muscle use, Clear to Auscultation, no Crackles, no wheezes Abdomen: BS present, Soft and no tenderness, urostomy bag attached with clear urine. Skin: Dry skin, psoriasis Extremities: no Pedal edema, no calf tenderness Neurologic: without any new focal findings Gait not checked due to patient safety concerns  Data Reviewed: I have personally reviewed and interpreted labs, imaging as discussed below.  CBC: Recent Labs  Lab 04/05/24 0845 04/07/24 1407 04/08/24 1034 04/10/24 0750  WBC 14.0* 9.6 10.5 10.0  NEUTROABS 9.2*  --  7.1 6.2  HGB 8.6* 8.3* 7.7* 7.8*  HCT 27.2* 26.5* 23.9* 23.9*  MCV 93.5 94.3 91.9 92.3  PLT 52* 40* 38* 35*   Basic Metabolic Panel: Recent Labs  Lab 04/05/24 0845 04/07/24 1407 04/08/24 1034 04/10/24 0750  NA 132* 136  133* 137  K 4.5 4.8 4.5 4.3  CL 101 103 104 108  CO2 17* 19* 15* 16*  GLUCOSE 148* 109* 92 100*  BUN 41* 44* 46* 40*  CREATININE 2.27* 1.87* 1.87* 1.79*  CALCIUM  8.2* 8.0* 7.8* 8.0*  MG  --  2.2  --   --    GFR: Estimated Creatinine Clearance: 32.9 mL/min (A) (by C-G formula based on SCr of 1.79 mg/dL (H)). Liver Function Tests: Recent Labs  Lab 04/05/24 0845 04/07/24 1407 04/08/24 1034 04/10/24 0750  AST 67* 58* 55* 63*  ALT 27 27 29 30   ALKPHOS 2,148* 2,065* 1,319* 1,894*  BILITOT 0.8 1.3* 1.3* 1.3*  PROT 6.5 7.0 6.2* 6.1*  ALBUMIN  2.4* 2.3* 2.3* 2.2*   No results for input(s): LIPASE, AMYLASE in the last 168 hours. No results for input(s): AMMONIA in the last 168 hours. Coagulation Profile: No results for input(s): INR, PROTIME in the last 168 hours. Cardiac Enzymes: No results for input(s): CKTOTAL, CKMB, CKMBINDEX, TROPONINI in the last 168 hours. BNP (last 3 results) No results for input(s): PROBNP in the last 8760 hours. HbA1C: No results for input(s): HGBA1C in the last 72 hours. CBG: No results for input(s): GLUCAP in the last 168 hours. Lipid Profile: No results for input(s): CHOL, HDL, LDLCALC, TRIG, CHOLHDL, LDLDIRECT in the last 72 hours. Thyroid  Function Tests: No results for input(s): TSH, T4TOTAL, FREET4, T3FREE, THYROIDAB in the last 72 hours. Anemia Panel: No results for input(s): VITAMINB12, FOLATE, FERRITIN, TIBC, IRON, RETICCTPCT in the last 72 hours. Urine analysis:    Component Value Date/Time   COLORURINE AMBER (A) 04/09/2024 1133   APPEARANCEUR CLOUDY (A) 04/09/2024 1133   APPEARANCEUR Hazy (A) 11/15/2022 1338   LABSPEC 1.014 04/09/2024 1133   PHURINE 5.0 04/09/2024 1133   GLUCOSEU NEGATIVE 04/09/2024 1133   HGBUR MODERATE (A) 04/09/2024 1133   BILIRUBINUR NEGATIVE 04/09/2024 1133   BILIRUBINUR Negative 11/15/2022 1338   KETONESUR NEGATIVE 04/09/2024 1133   PROTEINUR 100 (A)  04/09/2024 1133   NITRITE NEGATIVE 04/09/2024 1133   LEUKOCYTESUR LARGE (A) 04/09/2024 1133    Radiological Exams on Admission: No results found.  I reviewed all nursing notes, pharmacy notes, vitals, pertinent old records.  Assessment/Plan Principal Problem:   UTI (urinary tract infection)  # UTI (urinary tract infection) Started ceftriaxone  2 g IV daily Follow pending urine culture from 10/28 Follow repeat urine culture noted on 10/29 Follow CT scan  # Metabolic acidosis due to CKD stage III a/b Baseline sCr 1.59--1.7, eGFR 48--44 CKD 3 a/b CO2 level 16 on admission sCr 1.79 Started bicarb IV infusion Check BMP daily   # Metastatic stage IV urothelial bladder cancer S/p bladder  surgery and urostomy bag Patient is following oncologist Dr. 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 3 times daily with holding parameters Monitor BP Continue hydration    Nutrition: Regular diet DVT Prophylaxis: SCD, pharmacological prophylaxis contraindicated due to thrombocytopenia  Advance goals of care discussion: DNR   Consults: I personally Discussed with Dr Jacobo   Family Communication: family was present at bedside, at the time of interview.  Opportunity was given to ask question and all questions were answered satisfactorily.  Disposition: Admitted as inpatient, telemetry unit. Likely to be discharged home, in few days when stable.  I have discussed plan of care as described above with RN and patient/family.  Severity of Illness: The appropriate patient status for this patient is INPATIENT. Inpatient  status is judged to be reasonable and necessary in order to provide the required intensity of service to ensure the patient's safety. The patient's presenting symptoms, physical exam findings, and initial radiographic and laboratory data in the context of their chronic comorbidities is felt to place them at high risk for further clinical deterioration. Furthermore, it is not anticipated that the patient will be medically stable for discharge from the hospital within 2 midnights of admission.   * I certify that at the point of admission it is my clinical judgment that the patient will require inpatient hospital care spanning beyond 2 midnights from the point of admission due to high intensity of service, high risk for further deterioration and high frequency of surveillance required.*   Author: ELVAN SOR, MD [facility] 04/10/2024 12:38 PM   To reach On-call, see care teams to locate the attending and reach out to them via [email or web address]. If 7PM-7AM, please contact night-coverage If you still have difficulty reaching the attending provider, please page the [facility] (Director on Call) for [facility] on amion for assistance.

## 2024-04-11 DIAGNOSIS — Z515 Encounter for palliative care: Secondary | ICD-10-CM

## 2024-04-11 DIAGNOSIS — N3001 Acute cystitis with hematuria: Secondary | ICD-10-CM | POA: Diagnosis not present

## 2024-04-11 DIAGNOSIS — D649 Anemia, unspecified: Secondary | ICD-10-CM | POA: Diagnosis not present

## 2024-04-11 LAB — AMMONIA: Ammonia: 16 umol/L (ref 9–35)

## 2024-04-11 LAB — TYPE AND SCREEN
ABO/RH(D): B POS
Antibody Screen: NEGATIVE
Unit division: 0

## 2024-04-11 LAB — HEPATIC FUNCTION PANEL
ALT: 29 U/L (ref 0–44)
AST: 60 U/L — ABNORMAL HIGH (ref 15–41)
Albumin: 1.9 g/dL — ABNORMAL LOW (ref 3.5–5.0)
Alkaline Phosphatase: 1585 U/L — ABNORMAL HIGH (ref 38–126)
Bilirubin, Direct: 0.5 mg/dL — ABNORMAL HIGH (ref 0.0–0.2)
Indirect Bilirubin: 0.6 mg/dL (ref 0.3–0.9)
Total Bilirubin: 1.1 mg/dL (ref 0.0–1.2)
Total Protein: 5.3 g/dL — ABNORMAL LOW (ref 6.5–8.1)

## 2024-04-11 LAB — BASIC METABOLIC PANEL WITH GFR
Anion gap: 9 (ref 5–15)
BUN: 32 mg/dL — ABNORMAL HIGH (ref 8–23)
CO2: 20 mmol/L — ABNORMAL LOW (ref 22–32)
Calcium: 7.6 mg/dL — ABNORMAL LOW (ref 8.9–10.3)
Chloride: 106 mmol/L (ref 98–111)
Creatinine, Ser: 1.4 mg/dL — ABNORMAL HIGH (ref 0.61–1.24)
GFR, Estimated: 55 mL/min — ABNORMAL LOW (ref >60)
Glucose, Bld: 102 mg/dL — ABNORMAL HIGH (ref 70–99)
Potassium: 3.7 mmol/L (ref 3.5–5.1)
Sodium: 135 mmol/L (ref 135–145)

## 2024-04-11 LAB — URINE CULTURE: Culture: 80000 — AB

## 2024-04-11 LAB — CBC
HCT: 25 % — ABNORMAL LOW (ref 39.0–52.0)
Hemoglobin: 7.9 g/dL — ABNORMAL LOW (ref 13.0–17.0)
MCH: 28.3 pg (ref 26.0–34.0)
MCHC: 31.6 g/dL (ref 30.0–36.0)
MCV: 89.6 fL (ref 80.0–100.0)
Platelets: 23 K/uL — CL (ref 150–400)
RBC: 2.79 MIL/uL — ABNORMAL LOW (ref 4.22–5.81)
RDW: 18.8 % — ABNORMAL HIGH (ref 11.5–15.5)
WBC: 8.3 K/uL (ref 4.0–10.5)
nRBC: 0.6 % — ABNORMAL HIGH (ref 0.0–0.2)

## 2024-04-11 LAB — BPAM RBC
Blood Product Expiration Date: 202511182359
ISSUE DATE / TIME: 202510291013
Unit Type and Rh: 7300

## 2024-04-11 LAB — PHOSPHORUS: Phosphorus: 2.6 mg/dL (ref 2.5–4.6)

## 2024-04-11 LAB — MAGNESIUM: Magnesium: 2.1 mg/dL (ref 1.7–2.4)

## 2024-04-11 MED ORDER — TRAMADOL HCL 50 MG PO TABS
50.0000 mg | ORAL_TABLET | Freq: Three times a day (TID) | ORAL | Status: DC
  Administered 2024-04-11 – 2024-04-12 (×4): 50 mg via ORAL
  Filled 2024-04-11 (×4): qty 1

## 2024-04-11 MED ORDER — ADULT MULTIVITAMIN W/MINERALS CH
1.0000 | ORAL_TABLET | Freq: Every day | ORAL | Status: DC
  Administered 2024-04-11 – 2024-04-13 (×3): 1 via ORAL
  Filled 2024-04-11 (×3): qty 1

## 2024-04-11 MED ORDER — LACTULOSE 10 GM/15ML PO SOLN
20.0000 g | Freq: Three times a day (TID) | ORAL | Status: DC
  Filled 2024-04-11 (×2): qty 30

## 2024-04-11 MED ORDER — ENSURE PLUS HIGH PROTEIN PO LIQD
237.0000 mL | Freq: Three times a day (TID) | ORAL | Status: DC
  Administered 2024-04-11 – 2024-04-13 (×3): 237 mL via ORAL

## 2024-04-11 MED ORDER — MORPHINE SULFATE (PF) 2 MG/ML IV SOLN: 2.0000 mg | INTRAVENOUS | Status: DC | PRN

## 2024-04-11 MED ORDER — MEGESTROL ACETATE 20 MG PO TABS
100.0000 mg | ORAL_TABLET | Freq: Two times a day (BID) | ORAL | Status: DC
Start: 2024-04-11 — End: 2024-04-13
  Administered 2024-04-11 – 2024-04-13 (×4): 100 mg via ORAL
  Filled 2024-04-11 (×4): qty 5

## 2024-04-11 NOTE — Progress Notes (Addendum)
 Presented from oncology office with failed outpatient treatment of UTI. Following repeat urine culture from 10/29. No TOC needs noted at this time. TOC will continue to follow.

## 2024-04-11 NOTE — Progress Notes (Signed)
 Initial Nutrition Assessment  DOCUMENTATION CODES:   Underweight, Severe malnutrition in context of chronic illness  INTERVENTION:   -MVI with minerals daily -Continue regular diet -Ensure Plus High Protein po TID, each supplement provides 350 kcal and 20 grams of protein  -Encourage adequate oral intake; family can bring in outside food if desired -RD forwarded note to Cancer Center RD for continuity of care  NUTRITION DIAGNOSIS:   Severe Malnutrition related to chronic illness (stage IV metastatic bladder cancer) as evidenced by moderate fat depletion, severe fat depletion, moderate muscle depletion, severe muscle depletion, percent weight loss.  GOAL:   Patient will meet greater than or equal to 90% of their needs  MONITOR:   PO intake, Supplement acceptance  REASON FOR ASSESSMENT:   Consult Assessment of nutrition requirement/status, Diet education  ASSESSMENT:   68 y.o. 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  As per patient he is getting intermittent confusion since Sunday, pain in the neck.  Oncology patient failed outpatient treatment for UTI, may need IV antibiotics and further workup as an inpatient.  Patient admitted with UTI, metabolic acidosis, and stage IV urothelial bladder cancer on chemotherapy.   Reviewed I/O's: +954 ml x 24 hours  Patient currently followed at the cancer center for chemotherapy. He is also s/p bladder surgery and urostomy.   Spoke with patient and family members (wife and daughter) at bedside. Patient reports he has had a decreased appetite over the past week secondary to early satiety, which he reports he suspects is related to side effects on chemotherapy. He reports he has been on chemo for approximately 3 weeks and family has noticed a  significant change in intake since then. Patient reports consumed a few bites of alfredo pasta yesterday. He shares that he feels hungry and has an appetite, but gets full very quickly. He is looking forward to eating breakfast but has not done so yet due to multiple provider visits this morning.   Patient reports that his UBW is around 160#, which he last weighed prior to cancer diagnosis. Per wife, he has lot about 20# since diagnosis and another 20 pounds over the past 3 weeks due to lack of appetite. Reviewed weight history; patient has experienced a 12.3% weight loss over the past month, which is significant for time frame.   He feels weak, but denies any changes in ADLs (the only fall he had was accidental, when he slipped on water  in the garage). He has been following by cancer center RD; he admits that he is not fond of most supplements- he hates Boost Breeze and does not care for Ensure or Boost, but will drink them for additional nutrition (likes vanilla flavor best). He wife mentioned that she recently purchased Fairlife drinks, which he likes the best secondary to less thick texture (tastes the most like milk).   Discussed importance of good meal and supplement intake to promote healing. Patient reluctantly agreeable to Ensure, Encouraged use of Fairlife shakes at home. Will forward to progress not to Cancer Center RD for continuity of care.   Patient is followed by oncology palliative care. Patient desire full scope care at this time.   Medications reviewed and include megace, remeron, and miralax.   Labs reviewed.    NUTRITION - FOCUSED PHYSICAL EXAM:  Flowsheet Row Most Recent Value  Orbital Region Moderate depletion  Upper Arm Region Severe depletion  Thoracic and Lumbar Region Severe depletion  Buccal Region Moderate depletion  Temple Region Severe depletion  Clavicle Bone Region Severe depletion  Clavicle and Acromion Bone Region Severe depletion  Scapular Bone Region  Severe depletion  Dorsal Hand Moderate depletion  Patellar Region Moderate depletion  Anterior Thigh Region Moderate depletion  Posterior Calf Region Moderate depletion  Edema (RD Assessment) None  Hair Reviewed  Eyes Reviewed  Mouth Reviewed  Skin Reviewed  Nails Reviewed    Diet Order:   Diet Order             Diet regular Room service appropriate? Yes; Fluid consistency: Thin  Diet effective now                   EDUCATION NEEDS:   Education needs have been addressed  Skin:  Skin Assessment: Reviewed RN Assessment  Last BM:  03/29/24  Height:   Ht Readings from Last 1 Encounters:  04/10/24 5' 10 (1.778 m)    Weight:   Wt Readings from Last 1 Encounters:  04/08/24 57.3 kg    Ideal Body Weight:  75.5 kg  BMI:  Body mass index is 18.14 kg/m.  Estimated Nutritional Needs:   Kcal:  2000-2200  Protein:  100-115 grams  Fluid:  2.0-2.2 L    Margery ORN, RD, LDN, CDCES Registered Dietitian III Certified Diabetes Care and Education Specialist If unable to reach this RD, please use RD Inpatient group chat on secure chat between hours of 8am-4 pm daily

## 2024-04-11 NOTE — Progress Notes (Addendum)
 PROGRESS NOTE    Ryan Dixon  FMW:[id number] DOB: 09/29/1955 DOA: 04/10/2024 PCP: [name], MD  No chief complaint on file.   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  He was following outpatient with oncology who initiated treatment for UTI but due to persistent confusion direct admitted him to the hospital for IV antibiotics and further workup.  Subjective: This morning patient reports pain is manageable and he is feeling slightly clear of mind.  His daughter and wife are at bedside and reports that he still seems intermittently confused.  They also suspect he is not accurately reporting his pain and the patient agrees that he can be stubborn.   Objective: Vitals:   04/11/24 0512 04/11/24 0606 04/11/24 1006 04/11/24 1157  BP: 113/66 122/72 112/65 108/66  Pulse: (!) 122 (!) 114 99 (!) 105  Resp: 20 20 18 18   Temp: 98.2 F (36.8 C) 98.4 F (36.9 C) 98.4 F (36.9 C) 98.6 F (37 C)  TempSrc: Oral Oral Oral Oral  SpO2: 99% 99% 95% 96%  Height:        Intake/Output Summary (Last 24 hours) at 04/11/2024 1505 Last data filed at 04/11/2024 1300 Gross per 24 hour  Intake 954.31 ml  Output --  Net 954.31 ml   There were no vitals filed for this visit.  Examination: General exam: Appears calm and comfortable, NAD, thin Respiratory system: No work of breathing, symmetric chest wall expansion Cardiovascular system: S1 & S2 heard, RRR.  Gastrointestinal system: Abdomen is nondistended, soft and nontender.  Neuro: Alert and oriented. No focal neurological deficits. Extremities: Symmetric, expected ROM Skin: Bruises and skin lesions of various stages of healing Assessment & Plan:  Principal Problem:   UTI (urinary tract infection)    Acute  metabolic encephalopathy - May be secondary to infection - Currently on ceftriaxone  for UTI - Blood cultures obtained today as well - Head CT on Sunday WNL - Ammonia level ordered - If patient's mental status does not return to baseline with IV antibiotics will consider brain MRI to rule out smaller metastatic lesions tomorrow.  I have discussed this with the family  Urinary tract infection - Continue with ceftriaxone  - Follow urine culture from 10/28: Proteus sensitive to ceftriaxone .  Resistant only to nitrofurantoin - Repeat urine culture on 10/29 also ordered  Metastatic stage IV urothelial bladder cancer - Status post bladder surgery and urostomy bag - Follows outpatient with oncology Dr. Jacobo - Currently receiving chemotherapy - CT abdomen pelvis does show progressive disease.  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  Scheduled tramadol .  As needed morphine added.  Metabolic acidosis due to CKD stage IIIb - Baseline creatinine near 1.6 - CO2 16 on arrival, improving now - Continue to monitor kidney function - Has been on IV bicarb.  Trend BMP  Anorexia Severe malnutrition - Secondary to malignancy - Currently on Megace, continue - Dietitian consult - Continue supplements  Constipation - Increase lactulose  Depression - Continue Remeron  Hypotension - On midodrine, wean as tolerated  Thrombocytopenia - Platelets down trended to 23, this may be secondary to patient's  Keytruda  though may also be complicated by bone marrow infiltration - Hold anticoagulation - Monitor closely   DVT prophylaxis: SCDs   Code Status: Full Code Disposition:  pending clinical resolution  Consultants:    Procedures:    Antimicrobials:  Anti-infectives (From admission, onward)     Start     Dose/Rate Route Frequency Ordered Stop   04/10/24 1330  cefTRIAXone  (ROCEPHIN ) 2 g in sodium chloride  0.9 % 100 mL IVPB        2 g 200 mL/hr over 30 Minutes Intravenous Every 24 hours 04/10/24 1235         Data Reviewed: I have personally reviewed following labs and imaging studies CBC: Recent Labs  Lab 04/05/24 0845 04/07/24 1407 04/08/24 1034 04/10/24 0750 04/10/24 1241 04/11/24 0341  WBC 14.0* 9.6 10.5 10.0 8.4 8.3  NEUTROABS 9.2*  --  7.1 6.2  --   --   HGB 8.6* 8.3* 7.7* 7.8* 8.4* 7.9*  HCT 27.2* 26.5* 23.9* 23.9* 26.1* 25.0*  MCV 93.5 94.3 91.9 92.3 89.1 89.6  PLT 52* 40* 38* 35* 27* 23*   Basic Metabolic Panel: Recent Labs  Lab 04/07/24 1407 04/08/24 1034 04/10/24 0750 04/10/24 1241 04/11/24 0341  NA 136 133* 137 136 135  K 4.8 4.5 4.3 4.4 3.7  CL 103 104 108 106 106  CO2 19* 15* 16* 18* 20*  GLUCOSE 109* 92 100* 82 102*  BUN 44* 46* 40* 43* 32*  CREATININE 1.87* 1.87* 1.79* 1.89* 1.40*  CALCIUM  8.0* 7.8* 8.0* 7.8* 7.6*  MG 2.2  --   --  2.3 2.1  PHOS  --   --   --  3.8 2.6   GFR: Estimated Creatinine Clearance: 42.1 mL/min (A) (by C-G formula based on SCr of 1.4 mg/dL (H)). Liver Function Tests: Recent Labs  Lab 04/05/24 0845 04/07/24 1407 04/08/24 1034 04/10/24 0750 04/11/24 0341  AST 67* 58* 55* 63* 60*  ALT 27 27 29 30 29   ALKPHOS 2,148* 2,065* 1,319* 1,894* 1,585*  BILITOT 0.8 1.3* 1.3* 1.3* 1.1  PROT 6.5 7.0 6.2* 6.1* 5.3*  ALBUMIN  2.4* 2.3* 2.3* 2.2* 1.9*   CBG: No results for input(s): GLUCAP in the last 168 hours.  Recent Results (from the past 240 hours)  Urine Culture     Status: Abnormal   Collection Time: 04/07/24  2:07 PM   Specimen: Urine, Random  Result Value Ref Range Status   Specimen Description   Final    URINE, RANDOM Performed at [facility], [address] [place], [place] [address]    Special Requests   Final    NONE Reflexed from [id number] Performed at [facility], [address] [place], [place] [address]    Culture (A)  Final    60,000 COLONIES/mL KLEBSIELLA OXYTOCA >=100,000 COLONIES/mL PROTEUS MIRABILIS    Report Status 04/10/2024 FINAL  Final   Organism ID, Bacteria KLEBSIELLA OXYTOCA (A)  Final   Organism ID, Bacteria PROTEUS MIRABILIS (A)  Final      Susceptibility   Klebsiella oxytoca - MIC*    AMPICILLIN RESISTANT Resistant     CEFEPIME <=0.12 SENSITIVE Sensitive     ERTAPENEM <=0.12 SENSITIVE Sensitive     CEFTRIAXONE  <=0.25 SENSITIVE Sensitive     CIPROFLOXACIN  <=0.06 SENSITIVE Sensitive     GENTAMICIN <=1 SENSITIVE Sensitive     NITROFURANTOIN 32 SENSITIVE Sensitive     TRIMETH/SULFA <=20 SENSITIVE Sensitive     AMPICILLIN/SULBACTAM 4 SENSITIVE Sensitive     PIP/TAZO  Value in next row Sensitive      <=4 SENSITIVEThis is a modified FDA-approved test that has been validated and its performance characteristics determined by the reporting laboratory.  This laboratory is certified under the Clinical Laboratory Improvement Amendments CLIA as qualified to perform high complexity clinical laboratory testing.    MEROPENEM Value in next row Sensitive      <=4 SENSITIVEThis is a modified FDA-approved test that has been validated and its performance characteristics determined by the reporting laboratory.  This laboratory is certified under the Clinical Laboratory Improvement Amendments CLIA as qualified to perform high complexity clinical laboratory testing.    * 60,000 COLONIES/mL KLEBSIELLA OXYTOCA   Proteus mirabilis - MIC*    AMPICILLIN Value in next row Sensitive      <=4 SENSITIVEThis is a modified FDA-approved test that has been validated and its performance characteristics determined by the reporting laboratory.  This laboratory is certified under the Clinical Laboratory Improvement Amendments CLIA as qualified to perform high complexity clinical laboratory testing.    CEFAZOLIN  (URINE) Value in next row Sensitive      4 SENSITIVEThis is a  modified FDA-approved test that has been validated and its performance characteristics determined by the reporting laboratory.  This laboratory is certified under the Clinical Laboratory Improvement Amendments CLIA as qualified to perform high complexity clinical laboratory testing.    CEFEPIME Value in next row Sensitive      4 SENSITIVEThis is a modified FDA-approved test that has been validated and its performance characteristics determined by the reporting laboratory.  This laboratory is certified under the Clinical Laboratory Improvement Amendments CLIA as qualified to perform high complexity clinical laboratory testing.    ERTAPENEM Value in next row Sensitive      4 SENSITIVEThis is a modified FDA-approved test that has been validated and its performance characteristics determined by the reporting laboratory.  This laboratory is certified under the Clinical Laboratory Improvement Amendments CLIA as qualified to perform high complexity clinical laboratory testing.    CEFTRIAXONE  Value in next row Sensitive      4 SENSITIVEThis is a modified FDA-approved test that has been validated and its performance characteristics determined by the reporting laboratory.  This laboratory is certified under the Clinical Laboratory Improvement Amendments CLIA as qualified to perform high complexity clinical laboratory testing.    CIPROFLOXACIN  Value in next row Sensitive      4 SENSITIVEThis is a modified FDA-approved test that has been validated and its performance characteristics determined by the reporting laboratory.  This laboratory is certified under the Clinical Laboratory Improvement Amendments CLIA as qualified to perform high complexity clinical laboratory testing.    GENTAMICIN Value in next row Sensitive      4 SENSITIVEThis is a modified FDA-approved test that has been validated and its performance characteristics determined by the reporting laboratory.  This laboratory is certified under the Clinical  Laboratory Improvement Amendments CLIA as qualified to perform high complexity clinical laboratory testing.    NITROFURANTOIN Value in next row Resistant      4 SENSITIVEThis is a modified FDA-approved test that has been validated and its performance characteristics determined by the reporting laboratory.  This laboratory is certified under the Clinical Laboratory Improvement Amendments CLIA as qualified to perform high complexity clinical laboratory testing.    TRIMETH/SULFA Value in next row Sensitive      4 SENSITIVEThis is a modified FDA-approved test that has been validated and its performance characteristics determined by the reporting laboratory.  This laboratory  is certified under the Clinical Laboratory Improvement Amendments CLIA as qualified to perform high complexity clinical laboratory testing.    AMPICILLIN/SULBACTAM Value in next row Sensitive      4 SENSITIVEThis is a modified FDA-approved test that has been validated and its performance characteristics determined by the reporting laboratory.  This laboratory is certified under the Clinical Laboratory Improvement Amendments CLIA as qualified to perform high complexity clinical laboratory testing.    PIP/TAZO Value in next row Sensitive      <=4 SENSITIVEThis is a modified FDA-approved test that has been validated and its performance characteristics determined by the reporting laboratory.  This laboratory is certified under the Clinical Laboratory Improvement Amendments CLIA as qualified to perform high complexity clinical laboratory testing.    MEROPENEM Value in next row Sensitive      <=4 SENSITIVEThis is a modified FDA-approved test that has been validated and its performance characteristics determined by the reporting laboratory.  This laboratory is certified under the Clinical Laboratory Improvement Amendments CLIA as qualified to perform high complexity clinical laboratory testing.    * >=100,000 COLONIES/mL PROTEUS MIRABILIS  Urine  Culture     Status: Abnormal   Collection Time: 04/09/24 11:33 AM   Specimen: Urine, Random  Result Value Ref Range Status   Specimen Description   Final    URINE, RANDOM Performed at [facility], [address], [place], [place] [address]    Special Requests   Final    NONE Performed at [facility], [address] [place], [place] [address]    Culture 80,000 COLONIES/mL PROTEUS MIRABILIS (A)  Final   Report Status 04/11/2024 FINAL  Final   Organism ID, Bacteria PROTEUS MIRABILIS (A)  Final      Susceptibility   Proteus mirabilis - MIC*    AMPICILLIN <=2 SENSITIVE Sensitive     CEFAZOLIN  (URINE) Value in next row Sensitive      4 SENSITIVEThis is a modified FDA-approved test that has been validated and its performance characteristics determined by the reporting laboratory.  This laboratory is certified under the Clinical Laboratory Improvement Amendments CLIA as qualified to perform high complexity clinical laboratory testing.    CEFEPIME Value in next row Sensitive      4 SENSITIVEThis is a modified FDA-approved test that has been validated and its performance characteristics determined by the reporting laboratory.  This laboratory is certified under the Clinical Laboratory Improvement Amendments CLIA as qualified to perform high complexity clinical laboratory testing.    ERTAPENEM Value in next row Sensitive      4 SENSITIVEThis is a modified FDA-approved test that has been validated and its performance characteristics determined by the reporting laboratory.  This laboratory is certified under the Clinical Laboratory Improvement Amendments CLIA as qualified to perform high complexity clinical laboratory testing.    CEFTRIAXONE  Value in next row Sensitive      4 SENSITIVEThis is a modified FDA-approved test that has been validated and its performance characteristics determined by the reporting laboratory.  This laboratory is certified under the Clinical Laboratory  Improvement Amendments CLIA as qualified to perform high complexity clinical laboratory testing.    CIPROFLOXACIN  Value in next row Sensitive      4 SENSITIVEThis is a modified FDA-approved test that has been validated and its performance characteristics determined by the reporting laboratory.  This laboratory is certified under the Clinical Laboratory Improvement Amendments CLIA as qualified to perform high complexity clinical laboratory testing.    GENTAMICIN Value in next  row Sensitive      4 SENSITIVEThis is a modified FDA-approved test that has been validated and its performance characteristics determined by the reporting laboratory.  This laboratory is certified under the Clinical Laboratory Improvement Amendments CLIA as qualified to perform high complexity clinical laboratory testing.    NITROFURANTOIN Value in next row Resistant      4 SENSITIVEThis is a modified FDA-approved test that has been validated and its performance characteristics determined by the reporting laboratory.  This laboratory is certified under the Clinical Laboratory Improvement Amendments CLIA as qualified to perform high complexity clinical laboratory testing.    TRIMETH/SULFA Value in next row Sensitive      4 SENSITIVEThis is a modified FDA-approved test that has been validated and its performance characteristics determined by the reporting laboratory.  This laboratory is certified under the Clinical Laboratory Improvement Amendments CLIA as qualified to perform high complexity clinical laboratory testing.    AMPICILLIN/SULBACTAM Value in next row Sensitive      4 SENSITIVEThis is a modified FDA-approved test that has been validated and its performance characteristics determined by the reporting laboratory.  This laboratory is certified under the Clinical Laboratory Improvement Amendments CLIA as qualified to perform high complexity clinical laboratory testing.    PIP/TAZO Value in next row Sensitive      <=4  SENSITIVEThis is a modified FDA-approved test that has been validated and its performance characteristics determined by the reporting laboratory.  This laboratory is certified under the Clinical Laboratory Improvement Amendments CLIA as qualified to perform high complexity clinical laboratory testing.    MEROPENEM Value in next row Sensitive      <=4 SENSITIVEThis is a modified FDA-approved test that has been validated and its performance characteristics determined by the reporting laboratory.  This laboratory is certified under the Clinical Laboratory Improvement Amendments CLIA as qualified to perform high complexity clinical laboratory testing.    * 80,000 COLONIES/mL PROTEUS MIRABILIS     Radiology Studies: CT CHEST ABDOMEN PELVIS WO CONTRAST Result Date: 04/10/2024 CLINICAL DATA:  Metastatic disease evaluation. Malignant neoplasm of urinary bladder. EXAM: CT CHEST, ABDOMEN AND PELVIS WITHOUT CONTRAST TECHNIQUE: Multidetector CT imaging of the chest, abdomen and pelvis was performed following the standard protocol without IV contrast. RADIATION DOSE REDUCTION: This exam was performed according to the departmental dose-optimization program which includes automated exposure control, adjustment of the mA and/or kV according to patient size and/or use of iterative reconstruction technique. COMPARISON:  CT dated 01/26/2024. FINDINGS: Evaluation of this exam is limited in the absence of intravenous contrast. CT CHEST FINDINGS Cardiovascular: There is no cardiomegaly or pericardial effusion. Three-vessel coronary vascular calcification. The ascending aorta is dilated measuring up to 4.2 cm. There is mild atherosclerotic calcification of the thoracic aorta. The central pulmonary arteries are grossly unremarkable. Mediastinum/Nodes: No hilar or mediastinal adenopathy. The esophagus is grossly unremarkable. No mediastinal fluid collection. Right-sided Port-A-Cath with tip in the region of the cavoatrial junction.  Lungs/Pleura: Background of emphysema. Small bilateral pleural effusions. No consolidative changes or pneumothorax. Several small scattered calcified granuloma. The central airways are patent. Musculoskeletal: Degenerative changes of the spine. Extensive diffuse heterogeneous and predominantly sclerotic changes of the skeleton significantly progressed since the prior CT consistent with metastatic disease. CT ABDOMEN PELVIS FINDINGS No intra-abdominal free air.  Small ascites. Hepatobiliary: Innumerable hepatic hypodense lesions, significantly progressed since the prior CT consistent with progression of hepatic metastasis. Evaluation is limited in the absence of intravenous contrast. No biliary dilatation. The gallbladder is unremarkable. Pancreas: Unremarkable.  No pancreatic ductal dilatation or surrounding inflammatory changes. Spleen: Normal in size without focal abnormality. Adrenals/Urinary Tract: The adrenal glands unremarkable. There is no hydronephrosis or nephrolithiasis on either side. Mild bilateral hydroureter. Status post cystectomy and right lower quadrant ileal conduit. Stomach/Bowel: There is no bowel obstruction or active inflammation. Postsurgical changes with bowel. No evidence of acute appendicitis. Vascular/Lymphatic: Advanced aortoiliac atherosclerotic disease. There is a 2.7 cm infrarenal aortic ectasia. The IVC is unremarkable. No portal venous gas. There is no adenopathy. Reproductive: Prostatectomy. Other: None Musculoskeletal: Extensive osteosclerosis consistent with metastatic disease. No acute osseous pathology. IMPRESSION: 1. Interval progression of hepatic and osseous metastatic disease. 2. Status post cystectomy and right lower quadrant ileal conduit. Mild bilateral hydroureter. No hydronephrosis. 3. Small bilateral pleural effusions. 4. No bowel obstruction. 5.  Aortic Atherosclerosis (ICD10-I70.0). Electronically Signed   By: Vanetta Chou M.D.   On: 04/10/2024 16:21     Scheduled Meds:  bisacodyl  10 mg Oral QHS   Chlorhexidine  Gluconate Cloth  6 each Topical Daily   feeding supplement  237 mL Oral TID BM   hydrocerin   Topical BID   megestrol  100 mg Oral BID   midodrine  5 mg Oral TID   mirtazapine  7.5 mg Oral QHS   multivitamin with minerals  1 tablet Oral Daily   polyethylene glycol  17 g Oral BID   sodium chloride  flush  10-40 mL Intracatheter Q12H   sodium chloride  flush  3 mL Intravenous Q12H   sodium chloride  flush  3 mL Intravenous Q12H   traMADol   50 mg Oral TID   Continuous Infusions:  cefTRIAXone  (ROCEPHIN )  IV 2 g (04/11/24 1357)     LOS: 1 day  MDM: Patient is high risk for one or more organ failure.  They necessitate ongoing hospitalization for continued IV therapies and subsequent lab monitoring. Total time spent interpreting labs and vitals, reviewing the medical record, coordinating care amongst consultants and care team members, directly assessing and discussing care with the patient and/or family: 55 min Susy Placzek, DO [facility]  To contact the attending physician between 7A-7P please use Epic Chat. To contact the covering physician during after hours 7P-7A, please review Amion.  04/11/2024, 3:05 PM   *This document has been created with the assistance of dictation software. Please excuse typographical errors. *

## 2024-04-11 NOTE — Plan of Care (Signed)
  Problem: Education: Goal: Knowledge of General Education information will improve Description: Including pain rating scale, medication(s)/side effects and non-pharmacologic comfort measures Outcome: Progressing   Problem: Clinical Measurements: Goal: Will remain free from infection Outcome: Progressing Goal: Cardiovascular complication will be avoided Outcome: Progressing   Problem: Activity: Goal: Risk for activity intolerance will decrease Outcome: Progressing   Problem: Coping: Goal: Level of anxiety will decrease Outcome: Progressing   Problem: Safety: Goal: Ability to remain free from injury will improve Outcome: Progressing

## 2024-04-11 NOTE — Consult Note (Signed)
 Palliative Medicine [facility] at [facility] [phone]) [phone] [phone]) [phone]   Name: Ryan Dixon Date: 04/11/2024 MRN: [id number]  DOB: 03-Nov-1955  Patient Care Team: [name], MD as PCP - General (Family Medicine) [name], MD as Consulting Physician (Oncology)    REASON FOR CONSULTATION: Ryan Dixon is a 68 y.o. 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  Palliative care was consulted to address goals. .   SOCIAL HISTORY:     reports that he has been smoking cigarettes. He has a 86 pack-year smoking history. He has never used smokeless tobacco. He reports current alcohol use. He reports that he does not use drugs.  Patient is married and lives at home with his wife.  He still works for [employer].   ADVANCE DIRECTIVES:  Not on file  CODE STATUS: Full code  PAST MEDICAL HISTORY: Past Medical History:  Diagnosis Date   Aortic atherosclerosis    Chronic foot pain, left    History of hematuria    Hypertension    Other emphysema (HCC)    Other male erectile dysfunction    Pre-diabetes    Pure hypercholesterolemia    Tobacco dependence     PAST SURGICAL HISTORY:  Past Surgical History:  Procedure Laterality Date   ANKLE ARTHROSCOPY Left 08/11/2020   Procedure: LEFT ANKLE ARTHROSCOPY AND DEBRIDEMENT;  Surgeon: [name], MD;  Location: [facility];  Service: Orthopedics;  Laterality: Left;   BLADDER INSTILLATION N/A 11/29/2022   Procedure: BLADDER INSTILLATION OF GEMCITABINE ;  Surgeon: [name], MD;  Location: [facility];  Service: Urology;  Laterality: N/A;   COLONOSCOPY     CYSTOSCOPY WITH INJECTION N/A 07/07/2023   Procedure: CYSTOSCOPY WITH INJECTION;  Surgeon: [name], MD;  Location: [facility];  Service: Urology;  Laterality: N/A;   IR IMAGING GUIDED  PORT INSERTION  01/20/2023   PELVIC LYMPH NODE DISSECTION N/A 07/07/2023   Procedure: PELVIC LYMPH NODE DISSECTION;  Surgeon: [name], MD;  Location: [facility];  Service: Urology;  Laterality: N/A;   ROBOT ASSISTED LAPAROSCOPIC COMPLETE CYSTECT ILEAL CONDUIT N/A 07/07/2023   Procedure: XI ROBOTIC ASSISTED LAPAROSCOPIC COMPLETE CYSTECT ILEAL CONDUIT, UMBILICAL HERNIA REPAIR, BILATERAL INGUINAL HERNIA REPAIR;  Surgeon: [name], MD;  Location: [facility];  Service: Urology;  Laterality: N/A;   ROBOT ASSISTED LAPAROSCOPIC RADICAL PROSTATECTOMY N/A 07/07/2023   Procedure: XI ROBOTIC ASSISTED LAPAROSCOPIC RADICAL PROSTATECTOMY;  Surgeon: [name], MD;  Location: [facility];  Service: Urology;  Laterality: N/A;   TONSILLECTOMY     TRANSURETHRAL RESECTION OF BLADDER TUMOR N/A 11/29/2022   Procedure: TRANSURETHRAL RESECTION OF BLADDER TUMOR (TURBT);  Surgeon: [name], MD;  Location: [facility];  Service: Urology;  Laterality: N/A;    HEMATOLOGY/ONCOLOGY HISTORY:  Oncology History  Bladder cancer (HCC)  10/25/2022 Initial Diagnosis   Seen by Dr. Twylla for intermittent hematuria since February 2024 along with other urinary symptoms.   10/28/2022 Imaging   CT hematuria workup Showed 2.2 cm bladder mass worrisome for cancer.  Adjacent mild diffuse bladder wall thickening.  Based on the location this may involve the extreme distal aspect of the right UVJ.  Extensive atherosclerotic changes with the occlusion of the right common iliac artery and areas of significant stenosis along the left. Please correlate with any symptoms such as claudication and further  workup as clinically appropriate.  CT chest with contrast IMPRESSION: 1. Lung-RADS 2, benign appearance or behavior. Continue annual screening with low-dose chest CT without contrast in 12 months. 2. Similar ascending aortic dilatation at 4.2 cm. Recommend attention on follow-up lung cancer screening CT in 1 year.    11/29/2022 Procedure   Intraoperative findings:  Cystoscopy-urethra normal in caliber without stricture.  Prostate with mild lateral lobe enlargement and moderate bladder neck elevation.  UOs normal-appearing bilaterally.  The right UO was anterior to the bladder tumor Bladder tumor: 3 cm papillary/nodular tumor superior to right UO and extending laterally; ~ 1 cm papillary tumor anterior bladder neck 10:00; 2-1 cm papillary tumors anterior bladder neck 2:00    11/29/2022 Pathology Results        12/07/2022 Cancer Staging   Staging form: Urinary Bladder, AJCC 8th Edition - Clinical stage from 12/07/2022: Stage II (cT2, cN0, cM0) - Signed by Clista Bimler, MD on 01/02/2023 Stage prefix: Initial diagnosis WHO/ISUP grade (low/high): High Grade Histologic grading system: 2 grade system   01/02/2023 Initial Diagnosis   Bladder cancer (HCC)   01/23/2023 - 04/06/2023 Chemotherapy   Patient is on Treatment Plan : BLADDER DOSE DENSE MVAC q14d     07/07/2023 Cancer Staging   Staging form: Urinary Bladder, AJCC 8th Edition - Pathologic stage from 07/07/2023: Stage IIIB (ypT2a, pN2, cM0) - Signed by Clista Bimler, MD on 08/31/2023 Stage prefix: Post-therapy Response to neoadjuvant therapy: Partial response WHO/ISUP grade (low/high): High Grade Histologic grading system: 2 grade system   08/31/2023 - 09/26/2023 Chemotherapy   Patient is on Treatment Plan : BLADDER Nivolumab  (240) q14d     02/23/2024 - 03/15/2024 Chemotherapy   Patient is on Treatment Plan : UROTHELIAL ADVANCED, METASTATIC ENFORTUMAB D1, D8 + PEMBROLIZUMAB  (200) D1 Q21D     04/05/2024 -  Chemotherapy   Patient is on Treatment Plan : BLADDER Pembrolizumab  (200) q21d       ALLERGIES:  is allergic to nsaids and other.  MEDICATIONS:  Current Facility-Administered Medications  Medication Dose Route Frequency Provider Last Rate Last Admin   acetaminophen  (TYLENOL ) tablet 650 mg  650 mg Oral Q6H PRN Von Bellis, MD       Or    acetaminophen  (TYLENOL ) suppository 650 mg  650 mg Rectal Q6H PRN Von Bellis, MD       albuterol  (PROVENTIL ) (2.5 MG/3ML) 0.083% nebulizer solution 2.5 mg  2.5 mg Inhalation Q6H PRN Von Bellis, MD       bisacodyl (DULCOLAX) EC tablet 10 mg  10 mg Oral QHS Von Bellis, MD       bisacodyl (DULCOLAX) suppository 10 mg  10 mg Rectal Daily PRN Von Bellis, MD       cefTRIAXone  (ROCEPHIN ) 2 g in sodium chloride  0.9 % 100 mL IVPB  2 g Intravenous Q24H Von Bellis, MD 200 mL/hr at 04/11/24 1357 2 g at 04/11/24 1357   Chlorhexidine  Gluconate Cloth 2 % PADS 6 each  6 each Topical Daily Von Bellis, MD   6 each at 04/10/24 1747   feeding supplement (ENSURE PLUS HIGH PROTEIN) liquid 237 mL  237 mL Oral TID BM Dezii, Alexandra, DO   237 mL at 04/11/24 1207   heparin  lock flush 100 unit/mL  500 Units Intracatheter Daily PRN [name], MD       heparin  lock flush 100 unit/mL  250 Units Intracatheter PRN Finnegan, Timothy J, MD       hydrocerin (EUCERIN) cream   Topical BID Von Bellis,  MD   Given at 04/11/24 1058   megestrol (MEGACE) tablet 100 mg  100 mg Oral BID Demaryius Imran R, NP       midodrine (PROAMATINE) tablet 5 mg  5 mg Oral TID Von Bellis, MD   5 mg at 04/11/24 1051   mirtazapine (REMERON) tablet 7.5 mg  7.5 mg Oral QHS Von Bellis, MD   7.5 mg at 04/10/24 2123   morphine (PF) 2 MG/ML injection 2 mg  2 mg Intravenous Q4H PRN Dezii, Alexandra, DO       multivitamin with minerals tablet 1 tablet  1 tablet Oral Daily Dezii, Alexandra, DO   1 tablet at 04/11/24 1050   ondansetron  (ZOFRAN ) tablet 4 mg  4 mg Oral Q6H PRN Von Bellis, MD       Or   ondansetron  (ZOFRAN ) injection 4 mg  4 mg Intravenous Q6H PRN Von Bellis, MD       polyethylene glycol (MIRALAX / GLYCOLAX) packet 17 g  17 g Oral BID Von Bellis, MD   17 g at 04/10/24 2122   sodium chloride  flush (NS) 0.9 % injection 10 mL  10 mL Intracatheter PRN [name], MD       sodium chloride  flush (NS)  0.9 % injection 10-40 mL  10-40 mL Intracatheter Q12H Von Bellis, MD   10 mL at 04/11/24 1100   sodium chloride  flush (NS) 0.9 % injection 10-40 mL  10-40 mL Intracatheter PRN Von Bellis, MD       sodium chloride  flush (NS) 0.9 % injection 3 mL  3 mL Intracatheter PRN [name], MD       sodium chloride  flush (NS) 0.9 % injection 3 mL  3 mL Intravenous Q12H Von Bellis, MD   3 mL at 04/11/24 1100   sodium chloride  flush (NS) 0.9 % injection 3 mL  3 mL Intravenous Q12H Von Bellis, MD   3 mL at 04/11/24 1056   sodium chloride  flush (NS) 0.9 % injection 3 mL  3 mL Intravenous PRN Von Bellis, MD       traMADol  (ULTRAM ) tablet 50 mg  50 mg Oral TID Dezii, Alexandra, DO   50 mg at 04/11/24 1205   Facility-Administered Medications Ordered in Other Encounters  Medication Dose Route Frequency Provider Last Rate Last Admin   heparin  lock flush 100 unit/mL  500 Units Intravenous Once Agrawal, Kavita, MD        VITAL SIGNS: BP 108/66 (BP Location: Right Arm)   Pulse (!) 105   Temp 98.6 F (37 C) (Oral)   Resp 18   Ht 5' 10 (1.778 m)   SpO2 96%   BMI 18.14 kg/m  There were no vitals filed for this visit.  Estimated body mass index is 18.14 kg/m as calculated from the following:   Height as of this encounter: 5' 10 (1.778 m).   Weight as of 04/08/24: 126 lb 6.4 oz (57.3 kg).  LABS: CBC:    Component Value Date/Time   WBC 8.3 04/11/2024 0341   HGB 7.9 (L) 04/11/2024 0341   HGB 7.7 (L) 04/08/2024 1034   HCT 25.0 (L) 04/11/2024 0341   PLT 23 (LL) 04/11/2024 0341   PLT 38 (L) 04/08/2024 1034   MCV 89.6 04/11/2024 0341   NEUTROABS 6.2 04/10/2024 0750   LYMPHSABS 1.3 04/10/2024 0750   MONOABS 0.5 04/10/2024 0750   EOSABS 0.1 04/10/2024 0750   BASOSABS 0.1 04/10/2024 0750   Comprehensive Metabolic Panel:    Component  Value Date/Time   NA 135 04/11/2024 0341   K 3.7 04/11/2024 0341   CL 106 04/11/2024 0341   CO2 20 (L) 04/11/2024 0341   BUN 32 (H) 04/11/2024  0341   CREATININE 1.40 (H) 04/11/2024 0341   CREATININE 1.79 (H) 04/10/2024 0750   GLUCOSE 102 (H) 04/11/2024 0341   CALCIUM  7.6 (L) 04/11/2024 0341   AST 60 (H) 04/11/2024 0341   AST 63 (H) 04/10/2024 0750   ALT 29 04/11/2024 0341   ALT 30 04/10/2024 0750   ALKPHOS 1,585 (H) 04/11/2024 0341   BILITOT 1.1 04/11/2024 0341   BILITOT 1.3 (H) 04/10/2024 0750   PROT 5.3 (L) 04/11/2024 0341   ALBUMIN  1.9 (L) 04/11/2024 0341    RADIOGRAPHIC STUDIES: CT CHEST ABDOMEN PELVIS WO CONTRAST Result Date: 04/10/2024 CLINICAL DATA:  Metastatic disease evaluation. Malignant neoplasm of urinary bladder. EXAM: CT CHEST, ABDOMEN AND PELVIS WITHOUT CONTRAST TECHNIQUE: Multidetector CT imaging of the chest, abdomen and pelvis was performed following the standard protocol without IV contrast. RADIATION DOSE REDUCTION: This exam was performed according to the departmental dose-optimization program which includes automated exposure control, adjustment of the mA and/or kV according to patient size and/or use of iterative reconstruction technique. COMPARISON:  CT dated 01/26/2024. FINDINGS: Evaluation of this exam is limited in the absence of intravenous contrast. CT CHEST FINDINGS Cardiovascular: There is no cardiomegaly or pericardial effusion. Three-vessel coronary vascular calcification. The ascending aorta is dilated measuring up to 4.2 cm. There is mild atherosclerotic calcification of the thoracic aorta. The central pulmonary arteries are grossly unremarkable. Mediastinum/Nodes: No hilar or mediastinal adenopathy. The esophagus is grossly unremarkable. No mediastinal fluid collection. Right-sided Port-A-Cath with tip in the region of the cavoatrial junction. Lungs/Pleura: Background of emphysema. Small bilateral pleural effusions. No consolidative changes or pneumothorax. Several small scattered calcified granuloma. The central airways are patent. Musculoskeletal: Degenerative changes of the spine. Extensive diffuse  heterogeneous and predominantly sclerotic changes of the skeleton significantly progressed since the prior CT consistent with metastatic disease. CT ABDOMEN PELVIS FINDINGS No intra-abdominal free air.  Small ascites. Hepatobiliary: Innumerable hepatic hypodense lesions, significantly progressed since the prior CT consistent with progression of hepatic metastasis. Evaluation is limited in the absence of intravenous contrast. No biliary dilatation. The gallbladder is unremarkable. Pancreas: Unremarkable. No pancreatic ductal dilatation or surrounding inflammatory changes. Spleen: Normal in size without focal abnormality. Adrenals/Urinary Tract: The adrenal glands unremarkable. There is no hydronephrosis or nephrolithiasis on either side. Mild bilateral hydroureter. Status post cystectomy and right lower quadrant ileal conduit. Stomach/Bowel: There is no bowel obstruction or active inflammation. Postsurgical changes with bowel. No evidence of acute appendicitis. Vascular/Lymphatic: Advanced aortoiliac atherosclerotic disease. There is a 2.7 cm infrarenal aortic ectasia. The IVC is unremarkable. No portal venous gas. There is no adenopathy. Reproductive: Prostatectomy. Other: None Musculoskeletal: Extensive osteosclerosis consistent with metastatic disease. No acute osseous pathology. IMPRESSION: 1. Interval progression of hepatic and osseous metastatic disease. 2. Status post cystectomy and right lower quadrant ileal conduit. Mild bilateral hydroureter. No hydronephrosis. 3. Small bilateral pleural effusions. 4. No bowel obstruction. 5.  Aortic Atherosclerosis (ICD10-I70.0). Electronically Signed   By: Vanetta Chou M.D.   On: 04/10/2024 16:21   DG Chest Port 1 View Result Date: 04/07/2024 CLINICAL DATA:  Bladder cancer, weakness. EXAM: PORTABLE CHEST 1 VIEW COMPARISON:  12/28/2023 and CT chest 01/26/2024. FINDINGS: Trachea is midline. Heart size normal. Right IJ power port tip is in the SVC. Nodular density is  seen in the right upper lobe, superimposed with the  chest port. Calcified granuloma in the right lower lobe. Lungs are otherwise clear. No pleural fluid. IMPRESSION: 1. New right upper lobe nodule, worrisome for metastatic disease. 2. Otherwise, no acute findings. Electronically Signed   By: [name] Eke M.D.   On: 04/07/2024 14:59   CT Head Wo Contrast Result Date: 04/07/2024 EXAM: CT HEAD WITHOUT CONTRAST 04/07/2024 02:38:54 PM TECHNIQUE: CT of the head was performed without the administration of intravenous contrast. Automated exposure control, iterative reconstruction, and/or weight based adjustment of the mA/kV was utilized to reduce the radiation dose to as low as reasonably achievable. COMPARISON: None available. CLINICAL HISTORY: Mental status change, unknown cause. Pt has stage four bladder cancer and is currently taking chemo treatments. Per wife pt has been talking gibberish since Friday. FINDINGS: BRAIN AND VENTRICLES: No acute hemorrhage. No evidence of acute infarct. No hydrocephalus. No extra-axial collection. No mass effect or midline shift. ORBITS: No acute abnormality. SINUSES: No acute abnormality. SOFT TISSUES AND SKULL: No acute soft tissue abnormality. No skull fracture. IMPRESSION: 1. No acute intracranial abnormality. Electronically signed by: Gilmore Molt MD 04/07/2024 02:45 PM EDT RP Workstation: [id number]    PERFORMANCE STATUS (ECOG) : 2 - Symptomatic, <50% confined to bed  Review of Systems Unless otherwise noted, a complete review of systems is negative.  Physical Exam General: NAD Pulmonary: Unlabored Extremities: no edema, no joint deformities Skin: no rashes Neurological: Weakness but otherwise nonfocal  IMPRESSION: Patient was admitted to the hospital for treatment of UTI.  He reports feeling improved today.  Wife is concerned that patient is still having intermittent confusion.  I met privately with patient's wife and reviewed the results of CTs.   Unfortunately, patient appears to have significant progression of hepatic and osseous metastatic disease.  Patient continues to have anemia and thrombocytopenia, possibly from marrow involvement versus previous chemotherapy.  This would likely exclude alternative treatment options.  Patient does not appear to be responding to immunotherapy.  We discussed plan for treatment of his current acute medical problems and then will follow-up outpatient for further discussions regarding goals.  We did discuss possible hospice involvement.  PLAN: - Continue current scope of treatment - Plan outpatient follow-up  Case and plan discussed with Dr. Jacobo  Time Total: 50 minutes  Visit consisted of counseling and education dealing with the complex and emotionally intense issues of symptom management and palliative care in the setting of serious and potentially life-threatening illness.Greater than 50%  of this time was spent counseling and coordinating care related to the above assessment and plan.  Signed by: Fonda Mower, PhD, NP-C

## 2024-04-12 ENCOUNTER — Ambulatory Visit

## 2024-04-12 ENCOUNTER — Other Ambulatory Visit

## 2024-04-12 ENCOUNTER — Inpatient Hospital Stay

## 2024-04-12 ENCOUNTER — Ambulatory Visit: Admitting: Oncology

## 2024-04-12 ENCOUNTER — Encounter: Payer: Self-pay | Admitting: Oncology

## 2024-04-12 DIAGNOSIS — Z515 Encounter for palliative care: Secondary | ICD-10-CM | POA: Diagnosis not present

## 2024-04-12 DIAGNOSIS — D649 Anemia, unspecified: Secondary | ICD-10-CM | POA: Diagnosis not present

## 2024-04-12 DIAGNOSIS — E43 Unspecified severe protein-calorie malnutrition: Secondary | ICD-10-CM | POA: Insufficient documentation

## 2024-04-12 LAB — PHOSPHORUS: Phosphorus: 3.3 mg/dL (ref 2.5–4.6)

## 2024-04-12 LAB — COMPREHENSIVE METABOLIC PANEL WITH GFR
ALT: 32 U/L (ref 0–44)
AST: 62 U/L — ABNORMAL HIGH (ref 15–41)
Albumin: 2.2 g/dL — ABNORMAL LOW (ref 3.5–5.0)
Alkaline Phosphatase: 1737 U/L — ABNORMAL HIGH (ref 38–126)
Anion gap: 11 (ref 5–15)
BUN: 33 mg/dL — ABNORMAL HIGH (ref 8–23)
CO2: 22 mmol/L (ref 22–32)
Calcium: 8 mg/dL — ABNORMAL LOW (ref 8.9–10.3)
Chloride: 106 mmol/L (ref 98–111)
Creatinine, Ser: 1.77 mg/dL — ABNORMAL HIGH (ref 0.61–1.24)
GFR, Estimated: 42 mL/min — ABNORMAL LOW (ref >60)
Glucose, Bld: 99 mg/dL (ref 70–99)
Potassium: 3.7 mmol/L (ref 3.5–5.1)
Sodium: 139 mmol/L (ref 135–145)
Total Bilirubin: 1.1 mg/dL (ref 0.0–1.2)
Total Protein: 5.6 g/dL — ABNORMAL LOW (ref 6.5–8.1)

## 2024-04-12 LAB — CBC WITH DIFFERENTIAL/PLATELET
Abs Immature Granulocytes: 1.52 K/uL — ABNORMAL HIGH (ref 0.00–0.07)
Basophils Absolute: 0 K/uL (ref 0.0–0.1)
Basophils Relative: 0 %
Eosinophils Absolute: 0.1 K/uL (ref 0.0–0.5)
Eosinophils Relative: 1 %
HCT: 26 % — ABNORMAL LOW (ref 39.0–52.0)
Hemoglobin: 8.5 g/dL — ABNORMAL LOW (ref 13.0–17.0)
Immature Granulocytes: 14 %
Lymphocytes Relative: 15 %
Lymphs Abs: 1.6 K/uL (ref 0.7–4.0)
MCH: 28.8 pg (ref 26.0–34.0)
MCHC: 32.7 g/dL (ref 30.0–36.0)
MCV: 88.1 fL (ref 80.0–100.0)
Monocytes Absolute: 0.6 K/uL (ref 0.1–1.0)
Monocytes Relative: 5 %
Neutro Abs: 6.9 K/uL (ref 1.7–7.7)
Neutrophils Relative %: 65 %
Platelets: 24 K/uL — CL (ref 150–400)
RBC: 2.95 MIL/uL — ABNORMAL LOW (ref 4.22–5.81)
RDW: 18.9 % — ABNORMAL HIGH (ref 11.5–15.5)
Smear Review: NORMAL
WBC: 10.7 K/uL — ABNORMAL HIGH (ref 4.0–10.5)
nRBC: 1.7 % — ABNORMAL HIGH (ref 0.0–0.2)

## 2024-04-12 LAB — MAGNESIUM: Magnesium: 2.4 mg/dL (ref 1.7–2.4)

## 2024-04-12 MED ORDER — POLYETHYLENE GLYCOL 3350 17 G PO PACK
17.0000 g | PACK | Freq: Every day | ORAL | Status: DC
  Filled 2024-04-12: qty 1

## 2024-04-12 MED ORDER — TRAMADOL HCL 50 MG PO TABS
50.0000 mg | ORAL_TABLET | Freq: Four times a day (QID) | ORAL | Status: DC
  Administered 2024-04-12 – 2024-04-13 (×3): 50 mg via ORAL
  Filled 2024-04-12 (×3): qty 1

## 2024-04-12 MED ORDER — DIAZEPAM 5 MG PO TABS
5.0000 mg | ORAL_TABLET | Freq: Once | ORAL | Status: AC | PRN
  Administered 2024-04-12: 5 mg via ORAL
  Filled 2024-04-12: qty 1

## 2024-04-12 MED ORDER — TRAMADOL HCL 50 MG PO TABS: 50.0000 mg | ORAL_TABLET | Freq: Four times a day (QID) | ORAL | Status: DC

## 2024-04-12 MED ORDER — TRAMADOL HCL 50 MG PO TABS
50.0000 mg | ORAL_TABLET | Freq: Four times a day (QID) | ORAL | Status: DC
  Filled 2024-04-12: qty 1

## 2024-04-12 NOTE — TOC Progression Note (Signed)
 Transition of Care [facility]) - Progression Note    Patient Details  Name: Ryan Dixon MRN: [id number] Date of Birth: [birth year]  [facility]) CM/SW Contact  Dalia GORMAN Fuse, RN Phone Number: 04/12/2024, 10:25 AM  Clinical Narrative:     Remains on IV abx for UTI, plan for MRI if confusion doesn't resolve. UC from 10/29 is pending. No therapy is orderded.                     Expected Discharge Plan and Services                                               Social Drivers of Health (SDOH) Interventions SDOH Screenings   Food Insecurity: No Food Insecurity (04/10/2024)  Housing: Low Risk  (04/10/2024)  Transportation Needs: No Transportation Needs (04/10/2024)  Utilities: Not At Risk (04/10/2024)  Depression (PHQ2-9): Low Risk  (04/05/2024)  Financial Resource Strain: Low Risk  (02/08/2023)   Received from [facility]  Social Connections: Unknown (04/10/2024)  Stress: No Stress Concern Present (01/25/2023)  Tobacco Use: High Risk (04/10/2024)  Health Literacy: Adequate Health Literacy (01/25/2023)    Readmission Risk Interventions    04/11/2024   11:44 AM  Readmission Risk Prevention Plan  Transportation Screening Complete  PCP or Specialist Appt within 3-5 Days Complete  Social Work Consult for Recovery Care Planning/Counseling Complete  Palliative Care Screening Not Applicable  Medication Review Oceanographer) Complete

## 2024-04-12 NOTE — Plan of Care (Signed)
  Problem: Health Behavior/Discharge Planning: Goal: Ability to manage health-related needs will improve Outcome: Progressing   Problem: Clinical Measurements: Goal: Will remain free from infection Outcome: Progressing   Problem: Activity: Goal: Risk for activity intolerance will decrease Outcome: Progressing   Problem: Elimination: Goal: Will not experience complications related to bowel motility Outcome: Progressing   

## 2024-04-12 NOTE — Care Management Important Message (Signed)
 Important Message  Patient Details  Name: Ryan Dixon MRN: [id number] Date of Birth: [birth year]   Important Message Given:  Yes - Medicare IM     Adhya Cocco W, CMA 04/12/2024, 12:47 PM

## 2024-04-12 NOTE — Progress Notes (Addendum)
 PROGRESS NOTE    Ryan Dixon  FMW:[id number] DOB: December 12, 1955 DOA: 04/10/2024 PCP: [name], MD  No chief complaint on file.   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  He was following outpatient with oncology who initiated treatment for UTI but due to persistent confusion direct admitted him to the hospital for IV antibiotics and further workup.  Urine cultures grew Klebsiella.  Patient responded well to ceftriaxone .  Subjective: No acute events overnight.  This morning patient and family believe he is becoming clear of mind but still experiencing some intermittent confusion overnight.  Objective: Vitals:   04/11/24 1557 04/11/24 1953 04/11/24 2353 04/12/24 0512  BP: 118/66 124/69 120/70 122/75  Pulse: 99 99 98 88  Resp:  18 20 (!) 21  Temp: 98.3 F (36.8 C) 98.2 F (36.8 C) 98.6 F (37 C) 97.9 F (36.6 C)  TempSrc: Oral  Oral Oral  SpO2: 98% 96% 99% 95%  Height:        Intake/Output Summary (Last 24 hours) at 04/12/2024 0756 Last data filed at 04/11/2024 1544 Gross per 24 hour  Intake 100 ml  Output --  Net 100 ml   There were no vitals filed for this visit.  Examination: General exam: Appears calm and comfortable, NAD, thin Respiratory system: No work of breathing, symmetric chest wall expansion Cardiovascular system: S1 & S2 heard, RRR.  Gastrointestinal system: Abdomen is nondistended, soft and nontender.  Neuro: Alert and oriented. No focal neurological deficits. Extremities: Symmetric, expected ROM Skin: Bruises and skin lesions of various stages of healing Assessment & Plan:  Principal Problem:   UTI (urinary tract infection) Active Problems:   Anemia   Palliative care encounter    Acute metabolic encephalopathy -  May be secondary to infection - Currently on ceftriaxone  for UTI - Follow blood cultures as well.  Negative to date - Head CT on Sunday WNL - Ammonia WNL - Mental status is improving but not back to normal.  Brain MRI (without con secondary to CKD_ has been ordered to rule out metastatic spread, Valium ordered as premedication  Urinary tract infection - Continue with ceftriaxone  - Follow urine culture from 10/28: Proteus sensitive to ceftriaxone .  Resistant only to nitrofurantoin - Repeat urine culture on 10/29 also ordered  Metastatic stage IV urothelial bladder cancer - Status post bladder surgery and urostomy bag - Follows outpatient with oncology Dr. Jacobo - Currently receiving chemotherapy - CT abdomen pelvis does show progressive disease.  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  Scheduled tramadol , increase to every 6 today.  As needed morphine added.    Transaminitis - Alk phos very elevated, mild elevations in AST.  This is likely secondary to malignancy infiltration into the liver. - No acute intervention required at this time. - Patient does not currently drink alcohol  Metabolic acidosis due to CKD stage IIIb - Baseline creatinine near 1.6 - CO2 16 on arrival, improved to normal now - Continue to monitor kidney function - Status post IV bicarb  Anorexia Severe malnutrition - Secondary to malignancy - Currently on Megace, continue - Dietitian consult - Continue  supplements  Constipation - Increase lactulose  Depression - Continue Remeron  Hypotension - On midodrine, wean as tolerated - Suspect he will require increasing opioids as his cancer progresses.  Will likely need blood pressure support.  Monitor closely.  Likely midodrine at DC  Thrombocytopenia -  Platelets down trended to 23, this may be secondary to patient's Keytruda  though may also be complicated by bone marrow infiltration - Hold anticoagulation - Platelets are stabilizing.  No indication for transfusion at this time. - Monitor closely  Tobacco abuse - Patient is a current smoker - Continue without nicotine patch for now per pt preference -- has been counseled on cessation   DVT prophylaxis: SCDs   Code Status: Full Code Disposition:  pending clinical resolution.  Brain MRI today  Consultants:    Procedures:    Antimicrobials:  Anti-infectives (From admission, onward)    Start     Dose/Rate Route Frequency Ordered Stop   04/10/24 1330  cefTRIAXone  (ROCEPHIN ) 2 g in sodium chloride  0.9 % 100 mL IVPB        2 g 200 mL/hr over 30 Minutes Intravenous Every 24 hours 04/10/24 1235         Data Reviewed: I have personally reviewed following labs and imaging studies CBC: Recent Labs  Lab 04/05/24 0845 04/07/24 1407 04/08/24 1034 04/10/24 0750 04/10/24 1241 04/11/24 0341 04/12/24 0428  WBC 14.0*   < > 10.5 10.0 8.4 8.3 10.7*  NEUTROABS 9.2*  --  7.1 6.2  --   --  6.9  HGB 8.6*   < > 7.7* 7.8* 8.4* 7.9* 8.5*  HCT 27.2*   < > 23.9* 23.9* 26.1* 25.0* 26.0*  MCV 93.5   < > 91.9 92.3 89.1 89.6 88.1  PLT 52*   < > 38* 35* 27* 23* 24*   < > = values in this interval not displayed.   Basic Metabolic Panel: Recent Labs  Lab 04/07/24 1407 04/08/24 1034 04/10/24 0750 04/10/24 1241 04/11/24 0341 04/12/24 0428  NA 136 133* 137 136 135 139  K 4.8 4.5 4.3 4.4 3.7 3.7  CL 103 104 108 106 106 106  CO2 19* 15* 16* 18* 20* 22  GLUCOSE 109* 92 100* 82 102* 99  BUN 44* 46* 40* 43* 32* 33*  CREATININE 1.87* 1.87* 1.79* 1.89* 1.40* 1.77*  CALCIUM  8.0* 7.8* 8.0* 7.8* 7.6* 8.0*  MG 2.2  --   --  2.3 2.1 2.4  PHOS  --   --   --  3.8 2.6 3.3   GFR: Estimated Creatinine Clearance: 33.3 mL/min (A) (by C-G formula based on SCr of 1.77 mg/dL (H)). Liver Function  Tests: Recent Labs  Lab 04/07/24 1407 04/08/24 1034 04/10/24 0750 04/11/24 0341 04/12/24 0428  AST 58* 55* 63* 60* 62*  ALT 27 29 30 29  32  ALKPHOS 2,065* 1,319* 1,894* 1,585* 1,737*  BILITOT 1.3* 1.3* 1.3* 1.1 1.1  PROT 7.0 6.2* 6.1* 5.3* 5.6*  ALBUMIN  2.3* 2.3* 2.2* 1.9* 2.2*   CBG: No results for input(s): GLUCAP in the last 168 hours.  Recent Results (from the past 240 hours)  Urine Culture     Status: Abnormal   Collection Time: 04/07/24  2:07 PM   Specimen: Urine, Random  Result Value Ref Range Status   Specimen Description   Final    URINE, RANDOM Performed at [facility], [address], [place], [place] [address]    Special Requests   Final    NONE Reflexed from [phone]  Performed at [facility], [address] [place], [place] [address]    Culture (A)  Final    60,000 COLONIES/mL KLEBSIELLA OXYTOCA >=100,000 COLONIES/mL PROTEUS MIRABILIS    Report Status 04/10/2024 FINAL  Final   Organism ID, Bacteria KLEBSIELLA OXYTOCA (A)  Final   Organism ID, Bacteria PROTEUS MIRABILIS (A)  Final      Susceptibility   Klebsiella oxytoca - MIC*    AMPICILLIN RESISTANT Resistant     CEFEPIME <=0.12 SENSITIVE Sensitive     ERTAPENEM <=0.12 SENSITIVE Sensitive     CEFTRIAXONE  <=0.25 SENSITIVE Sensitive     CIPROFLOXACIN  <=0.06 SENSITIVE Sensitive     GENTAMICIN <=1 SENSITIVE Sensitive     NITROFURANTOIN 32 SENSITIVE Sensitive     TRIMETH/SULFA <=20 SENSITIVE Sensitive     AMPICILLIN/SULBACTAM 4 SENSITIVE Sensitive     PIP/TAZO Value in next row Sensitive      <=4 SENSITIVEThis is a modified FDA-approved test that has been validated and its performance characteristics determined by the reporting laboratory.  This laboratory is certified under the Clinical Laboratory Improvement Amendments CLIA as qualified to perform high complexity clinical laboratory testing.    MEROPENEM Value in next row Sensitive      <=4 SENSITIVEThis is a modified FDA-approved  test that has been validated and its performance characteristics determined by the reporting laboratory.  This laboratory is certified under the Clinical Laboratory Improvement Amendments CLIA as qualified to perform high complexity clinical laboratory testing.    * 60,000 COLONIES/mL KLEBSIELLA OXYTOCA   Proteus mirabilis - MIC*    AMPICILLIN Value in next row Sensitive      <=4 SENSITIVEThis is a modified FDA-approved test that has been validated and its performance characteristics determined by the reporting laboratory.  This laboratory is certified under the Clinical Laboratory Improvement Amendments CLIA as qualified to perform high complexity clinical laboratory testing.    CEFAZOLIN  (URINE) Value in next row Sensitive      4 SENSITIVEThis is a modified FDA-approved test that has been validated and its performance characteristics determined by the reporting laboratory.  This laboratory is certified under the Clinical Laboratory Improvement Amendments CLIA as qualified to perform high complexity clinical laboratory testing.    CEFEPIME Value in next row Sensitive      4 SENSITIVEThis is a modified FDA-approved test that has been validated and its performance characteristics determined by the reporting laboratory.  This laboratory is certified under the Clinical Laboratory Improvement Amendments CLIA as qualified to perform high complexity clinical laboratory testing.    ERTAPENEM Value in next row Sensitive      4 SENSITIVEThis is a modified FDA-approved test that has been validated and its performance characteristics determined by the reporting laboratory.  This laboratory is certified under the Clinical Laboratory Improvement Amendments CLIA as qualified to perform high complexity clinical laboratory testing.    CEFTRIAXONE  Value in next row Sensitive      4 SENSITIVEThis is a modified FDA-approved test that has been validated and its performance characteristics determined by the reporting  laboratory.  This laboratory is certified under the Clinical Laboratory Improvement Amendments CLIA as qualified to perform high complexity clinical laboratory testing.    CIPROFLOXACIN  Value in next row Sensitive      4 SENSITIVEThis is a modified FDA-approved test that has been validated and its performance characteristics determined by the reporting laboratory.  This laboratory is certified under the Clinical Laboratory Improvement Amendments CLIA as qualified to perform high complexity clinical laboratory testing.  GENTAMICIN Value in next row Sensitive      4 SENSITIVEThis is a modified FDA-approved test that has been validated and its performance characteristics determined by the reporting laboratory.  This laboratory is certified under the Clinical Laboratory Improvement Amendments CLIA as qualified to perform high complexity clinical laboratory testing.    NITROFURANTOIN Value in next row Resistant      4 SENSITIVEThis is a modified FDA-approved test that has been validated and its performance characteristics determined by the reporting laboratory.  This laboratory is certified under the Clinical Laboratory Improvement Amendments CLIA as qualified to perform high complexity clinical laboratory testing.    TRIMETH/SULFA Value in next row Sensitive      4 SENSITIVEThis is a modified FDA-approved test that has been validated and its performance characteristics determined by the reporting laboratory.  This laboratory is certified under the Clinical Laboratory Improvement Amendments CLIA as qualified to perform high complexity clinical laboratory testing.    AMPICILLIN/SULBACTAM Value in next row Sensitive      4 SENSITIVEThis is a modified FDA-approved test that has been validated and its performance characteristics determined by the reporting laboratory.  This laboratory is certified under the Clinical Laboratory Improvement Amendments CLIA as qualified to perform high complexity clinical laboratory  testing.    PIP/TAZO Value in next row Sensitive      <=4 SENSITIVEThis is a modified FDA-approved test that has been validated and its performance characteristics determined by the reporting laboratory.  This laboratory is certified under the Clinical Laboratory Improvement Amendments CLIA as qualified to perform high complexity clinical laboratory testing.    MEROPENEM Value in next row Sensitive      <=4 SENSITIVEThis is a modified FDA-approved test that has been validated and its performance characteristics determined by the reporting laboratory.  This laboratory is certified under the Clinical Laboratory Improvement Amendments CLIA as qualified to perform high complexity clinical laboratory testing.    * >=100,000 COLONIES/mL PROTEUS MIRABILIS  Urine Culture     Status: Abnormal   Collection Time: 04/09/24 11:33 AM   Specimen: Urine, Random  Result Value Ref Range Status   Specimen Description   Final    URINE, RANDOM Performed at [facility], [address], [place], [place] [address]    Special Requests   Final    NONE Performed at [facility], [address] [place], [place] [address]    Culture 80,000 COLONIES/mL PROTEUS MIRABILIS (A)  Final   Report Status 04/11/2024 FINAL  Final   Organism ID, Bacteria PROTEUS MIRABILIS (A)  Final      Susceptibility   Proteus mirabilis - MIC*    AMPICILLIN <=2 SENSITIVE Sensitive     CEFAZOLIN  (URINE) Value in next row Sensitive      4 SENSITIVEThis is a modified FDA-approved test that has been validated and its performance characteristics determined by the reporting laboratory.  This laboratory is certified under the Clinical Laboratory Improvement Amendments CLIA as qualified to perform high complexity clinical laboratory testing.    CEFEPIME Value in next row Sensitive      4 SENSITIVEThis is a modified FDA-approved test that has been validated and its performance characteristics determined by the reporting laboratory.  This  laboratory is certified under the Clinical Laboratory Improvement Amendments CLIA as qualified to perform high complexity clinical laboratory testing.    ERTAPENEM Value in next row Sensitive      4 SENSITIVEThis is a modified FDA-approved test that has been validated and its performance  characteristics determined by the reporting laboratory.  This laboratory is certified under the Clinical Laboratory Improvement Amendments CLIA as qualified to perform high complexity clinical laboratory testing.    CEFTRIAXONE  Value in next row Sensitive      4 SENSITIVEThis is a modified FDA-approved test that has been validated and its performance characteristics determined by the reporting laboratory.  This laboratory is certified under the Clinical Laboratory Improvement Amendments CLIA as qualified to perform high complexity clinical laboratory testing.    CIPROFLOXACIN  Value in next row Sensitive      4 SENSITIVEThis is a modified FDA-approved test that has been validated and its performance characteristics determined by the reporting laboratory.  This laboratory is certified under the Clinical Laboratory Improvement Amendments CLIA as qualified to perform high complexity clinical laboratory testing.    GENTAMICIN Value in next row Sensitive      4 SENSITIVEThis is a modified FDA-approved test that has been validated and its performance characteristics determined by the reporting laboratory.  This laboratory is certified under the Clinical Laboratory Improvement Amendments CLIA as qualified to perform high complexity clinical laboratory testing.    NITROFURANTOIN Value in next row Resistant      4 SENSITIVEThis is a modified FDA-approved test that has been validated and its performance characteristics determined by the reporting laboratory.  This laboratory is certified under the Clinical Laboratory Improvement Amendments CLIA as qualified to perform high complexity clinical laboratory testing.    TRIMETH/SULFA  Value in next row Sensitive      4 SENSITIVEThis is a modified FDA-approved test that has been validated and its performance characteristics determined by the reporting laboratory.  This laboratory is certified under the Clinical Laboratory Improvement Amendments CLIA as qualified to perform high complexity clinical laboratory testing.    AMPICILLIN/SULBACTAM Value in next row Sensitive      4 SENSITIVEThis is a modified FDA-approved test that has been validated and its performance characteristics determined by the reporting laboratory.  This laboratory is certified under the Clinical Laboratory Improvement Amendments CLIA as qualified to perform high complexity clinical laboratory testing.    PIP/TAZO Value in next row Sensitive      <=4 SENSITIVEThis is a modified FDA-approved test that has been validated and its performance characteristics determined by the reporting laboratory.  This laboratory is certified under the Clinical Laboratory Improvement Amendments CLIA as qualified to perform high complexity clinical laboratory testing.    MEROPENEM Value in next row Sensitive      <=4 SENSITIVEThis is a modified FDA-approved test that has been validated and its performance characteristics determined by the reporting laboratory.  This laboratory is certified under the Clinical Laboratory Improvement Amendments CLIA as qualified to perform high complexity clinical laboratory testing.    * 80,000 COLONIES/mL PROTEUS MIRABILIS  Urine Culture (for pregnant, neutropenic or urologic patients or patients with an indwelling urinary catheter)     Status: Abnormal (Preliminary result)   Collection Time: 04/10/24  3:28 PM   Specimen: Urine, Bag (ped)  Result Value Ref Range Status   Specimen Description   Final    URINE, BAG PED Performed at [facility], [address], [place], [place] [address]    Special Requests   Final    NONE Performed at [facility], [address]  [place], [place] [address]    Culture (A)  Final    >=100,000 COLONIES/mL GRAM NEGATIVE RODS IDENTIFICATION AND SUSCEPTIBILITIES TO FOLLOW CULTURE REINCUBATED FOR BETTER GROWTH Performed at [facility], [address], [place], [place] [address]    Report Status PENDING  Incomplete  Culture, blood (Routine X 2) w Reflex to ID Panel     Status: None (Preliminary result)   Collection Time: 04/11/24  8:39 AM   Specimen: Right Antecubital; Blood  Result Value Ref Range Status   Specimen Description RIGHT ANTECUBITAL  Final   Special Requests   Final    BOTTLES DRAWN AEROBIC AND ANAEROBIC Blood Culture adequate volume   Culture   Final    NO GROWTH < 24 HOURS Performed at [facility], [address], [place], [place] [address]    Report Status PENDING  Incomplete  Culture, blood (Routine X 2) w Reflex to ID Panel     Status: None (Preliminary result)   Collection Time: 04/11/24  8:39 AM   Specimen: BLOOD RIGHT HAND  Result Value Ref Range Status   Specimen Description BLOOD RIGHT HAND  Final   Special Requests   Final    BOTTLES DRAWN AEROBIC AND ANAEROBIC Blood Culture adequate volume   Culture   Final    NO GROWTH < 24 HOURS Performed at [facility], [address] [place], [place] [address]    Report Status PENDING  Incomplete     Radiology Studies: CT CHEST ABDOMEN PELVIS WO CONTRAST Result Date: 04/10/2024 CLINICAL DATA:  Metastatic disease evaluation. Malignant neoplasm of urinary bladder. EXAM: CT CHEST, ABDOMEN AND PELVIS WITHOUT CONTRAST TECHNIQUE: Multidetector CT imaging of the chest, abdomen and pelvis was performed following the standard protocol without IV contrast. RADIATION DOSE REDUCTION: This exam was performed according to the departmental dose-optimization program which includes automated exposure control, adjustment of the mA and/or kV according to patient size and/or use of iterative reconstruction technique. COMPARISON:  CT dated  01/26/2024. FINDINGS: Evaluation of this exam is limited in the absence of intravenous contrast. CT CHEST FINDINGS Cardiovascular: There is no cardiomegaly or pericardial effusion. Three-vessel coronary vascular calcification. The ascending aorta is dilated measuring up to 4.2 cm. There is mild atherosclerotic calcification of the thoracic aorta. The central pulmonary arteries are grossly unremarkable. Mediastinum/Nodes: No hilar or mediastinal adenopathy. The esophagus is grossly unremarkable. No mediastinal fluid collection. Right-sided Port-A-Cath with tip in the region of the cavoatrial junction. Lungs/Pleura: Background of emphysema. Small bilateral pleural effusions. No consolidative changes or pneumothorax. Several small scattered calcified granuloma. The central airways are patent. Musculoskeletal: Degenerative changes of the spine. Extensive diffuse heterogeneous and predominantly sclerotic changes of the skeleton significantly progressed since the prior CT consistent with metastatic disease. CT ABDOMEN PELVIS FINDINGS No intra-abdominal free air.  Small ascites. Hepatobiliary: Innumerable hepatic hypodense lesions, significantly progressed since the prior CT consistent with progression of hepatic metastasis. Evaluation is limited in the absence of intravenous contrast. No biliary dilatation. The gallbladder is unremarkable. Pancreas: Unremarkable. No pancreatic ductal dilatation or surrounding inflammatory changes. Spleen: Normal in size without focal abnormality. Adrenals/Urinary Tract: The adrenal glands unremarkable. There is no hydronephrosis or nephrolithiasis on either side. Mild bilateral hydroureter. Status post cystectomy and right lower quadrant ileal conduit. Stomach/Bowel: There is no bowel obstruction or active inflammation. Postsurgical changes with bowel. No evidence of acute appendicitis. Vascular/Lymphatic: Advanced aortoiliac atherosclerotic disease. There is a 2.7 cm infrarenal aortic  ectasia. The IVC is unremarkable. No portal venous gas. There is no adenopathy. Reproductive: Prostatectomy. Other: None Musculoskeletal: Extensive osteosclerosis consistent with metastatic disease. No acute osseous pathology. IMPRESSION: 1. Interval progression of hepatic and osseous metastatic disease. 2. Status post cystectomy  and right lower quadrant ileal conduit. Mild bilateral hydroureter. No hydronephrosis. 3. Small bilateral pleural effusions. 4. No bowel obstruction. 5.  Aortic Atherosclerosis (ICD10-I70.0). Electronically Signed   By: Vanetta Chou M.D.   On: 04/10/2024 16:21    Scheduled Meds:  bisacodyl  10 mg Oral QHS   Chlorhexidine  Gluconate Cloth  6 each Topical Daily   feeding supplement  237 mL Oral TID BM   hydrocerin   Topical BID   lactulose  20 g Oral TID   megestrol  100 mg Oral BID   midodrine  5 mg Oral TID   mirtazapine  7.5 mg Oral QHS   multivitamin with minerals  1 tablet Oral Daily   polyethylene glycol  17 g Oral BID   sodium chloride  flush  10-40 mL Intracatheter Q12H   sodium chloride  flush  3 mL Intravenous Q12H   sodium chloride  flush  3 mL Intravenous Q12H   traMADol   50 mg Oral TID   Continuous Infusions:  cefTRIAXone  (ROCEPHIN )  IV Stopped (04/11/24 1427)     LOS: 2 days  MDM: Patient is high risk for one or more organ failure.  They necessitate ongoing hospitalization for continued IV therapies and subsequent lab monitoring. Total time spent interpreting labs and vitals, reviewing the medical record, coordinating care amongst consultants and care team members, directly assessing and discussing care with the patient and/or family: 55 min Yorel Redder, DO [facility]  To contact the attending physician between 7A-7P please use Epic Chat. To contact the covering physician during after hours 7P-7A, please review Amion.  04/12/2024, 7:56 AM   *This document has been created with the assistance of dictation software. Please excuse  typographical errors. *

## 2024-04-13 ENCOUNTER — Encounter: Payer: Self-pay | Admitting: Oncology

## 2024-04-13 ENCOUNTER — Other Ambulatory Visit: Payer: Self-pay

## 2024-04-13 DIAGNOSIS — N3001 Acute cystitis with hematuria: Secondary | ICD-10-CM | POA: Diagnosis not present

## 2024-04-13 DIAGNOSIS — E43 Unspecified severe protein-calorie malnutrition: Secondary | ICD-10-CM | POA: Diagnosis not present

## 2024-04-13 DIAGNOSIS — D649 Anemia, unspecified: Secondary | ICD-10-CM | POA: Diagnosis not present

## 2024-04-13 LAB — COMPREHENSIVE METABOLIC PANEL WITH GFR
ALT: 30 U/L (ref 0–44)
AST: 55 U/L — ABNORMAL HIGH (ref 15–41)
Albumin: 2 g/dL — ABNORMAL LOW (ref 3.5–5.0)
Alkaline Phosphatase: 1649 U/L — ABNORMAL HIGH (ref 38–126)
Anion gap: 16 — ABNORMAL HIGH (ref 5–15)
BUN: 32 mg/dL — ABNORMAL HIGH (ref 8–23)
CO2: 19 mmol/L — ABNORMAL LOW (ref 22–32)
Calcium: 8.1 mg/dL — ABNORMAL LOW (ref 8.9–10.3)
Chloride: 104 mmol/L (ref 98–111)
Creatinine, Ser: 1.61 mg/dL — ABNORMAL HIGH (ref 0.61–1.24)
GFR, Estimated: 47 mL/min — ABNORMAL LOW (ref >60)
Glucose, Bld: 83 mg/dL (ref 70–99)
Potassium: 3.6 mmol/L (ref 3.5–5.1)
Sodium: 139 mmol/L (ref 135–145)
Total Bilirubin: 1.2 mg/dL (ref 0.0–1.2)
Total Protein: 5.5 g/dL — ABNORMAL LOW (ref 6.5–8.1)

## 2024-04-13 LAB — URINE CULTURE: Culture: >=100000 — AB

## 2024-04-13 LAB — CBC WITH DIFFERENTIAL/PLATELET
Abs Immature Granulocytes: 1.13 K/uL — ABNORMAL HIGH (ref 0.00–0.07)
Basophils Absolute: 0 K/uL (ref 0.0–0.1)
Basophils Relative: 0 %
Eosinophils Absolute: 0.1 K/uL (ref 0.0–0.5)
Eosinophils Relative: 1 %
HCT: 24.9 % — ABNORMAL LOW (ref 39.0–52.0)
Hemoglobin: 8.1 g/dL — ABNORMAL LOW (ref 13.0–17.0)
Immature Granulocytes: 14 %
Lymphocytes Relative: 12 %
Lymphs Abs: 0.9 K/uL (ref 0.7–4.0)
MCH: 28.7 pg (ref 26.0–34.0)
MCHC: 32.5 g/dL (ref 30.0–36.0)
MCV: 88.3 fL (ref 80.0–100.0)
Monocytes Absolute: 0.5 K/uL (ref 0.1–1.0)
Monocytes Relative: 7 %
Neutro Abs: 5.2 K/uL (ref 1.7–7.7)
Neutrophils Relative %: 66 %
Platelets: 17 K/uL — CL (ref 150–400)
RBC: 2.82 MIL/uL — ABNORMAL LOW (ref 4.22–5.81)
RDW: 18.8 % — ABNORMAL HIGH (ref 11.5–15.5)
WBC: 7.9 K/uL (ref 4.0–10.5)
nRBC: 1.9 % — ABNORMAL HIGH (ref 0.0–0.2)

## 2024-04-13 LAB — MAGNESIUM: Magnesium: 2.4 mg/dL (ref 1.7–2.4)

## 2024-04-13 LAB — PHOSPHORUS: Phosphorus: 2.7 mg/dL (ref 2.5–4.6)

## 2024-04-13 MED ORDER — MIDODRINE HCL 5 MG PO TABS
5.0000 mg | ORAL_TABLET | Freq: Three times a day (TID) | ORAL | 0 refills | Status: DC
  Filled 2024-04-13: qty 90, 30d supply, fill #0

## 2024-04-13 MED ORDER — SODIUM CHLORIDE 0.9% IV SOLUTION: Freq: Once | INTRAVENOUS | Status: AC

## 2024-04-13 MED ORDER — CEFPODOXIME PROXETIL 200 MG PO TABS
200.0000 mg | ORAL_TABLET | Freq: Two times a day (BID) | ORAL | 0 refills | Status: AC
  Filled 2024-04-13: qty 10, 5d supply, fill #0

## 2024-04-13 MED ORDER — HEPARIN SOD (PORK) LOCK FLUSH 100 UNIT/ML IV SOLN
500.0000 [IU] | Freq: Once | INTRAVENOUS | Status: AC
  Administered 2024-04-13: 500 [IU] via INTRAVENOUS
  Filled 2024-04-13: qty 5

## 2024-04-13 NOTE — Progress Notes (Signed)
 Patient is alert and oriented X 4. 1 units platelet transfusion done. No any adverse reaction seen. He tolerated well.

## 2024-04-13 NOTE — Discharge Summary (Signed)
 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  He was following outpatient with oncology who suspected UTI but due to persistent confusion direct admitted him to the hospital for IV antibiotics and further workup.  Urine cultures grew Klebsiella.  Patient responded well to ceftriaxone , which was transitioned to cefpodoxime at discharge for an additional 5 days for an extended course.  He did continue to experience some intermittent delirium so a brain MRI was performed to rule out metastatic lesions.  Unfortunately he cannot receive IV contrast given his CKD.  No obvious lesions or tumors seen in the brain.  On day of discharge patient's platelets dropped to 17 so he received 1 unit prior to DC.  After discussion with the patient and their family they are considering discontinuing Keytruda  therapy, which has had minimal impact on his malignancy but problematic side effect profile.  The patient has a goal to enjoy a final Christmas with his family.  He is hopeful to regain enough strength to make it through the next 2 months.  They plan to reevaluate their goals at a later time and will consider transition to hospice as an outpatient.  They are well-established with the oncology team and oncology palliative team.   Acute metabolic encephalopathy - Likely secondary to infection, cannot r/o metastatic spread to brain given MRI wo Con - Cultures grew Proteus resistant to nitrofurantoin, and Klebsiella resistant to ampicillin.  Patient  responded well to ceftriaxone  and is discharging on cefpodoxime for an additional 5 days.   --Blood cultures remain negative. - Head CT on Sunday WNL - Ammonia WNL - Brain MRI without con with multifocal T2 white matter hyperintensities consistent with chronic small vessel disease, eval for metastasis limited without IV contrast but no acute intracranial abnormality seen.  Urinary tract infection - Cultures grew Proteus resistant to nitrofurantoin, and Klebsiella resistant to ampicillin.  Patient responded well to ceftriaxone  and is discharging on cefpodoxime for an additional 5 days.     Metastatic stage IV urothelial bladder cancer - Status post bladder surgery and urostomy bag - Follows outpatient with oncology Dr. Jacobo - Currently receiving chemotherapy - CT abdomen pelvis does show progressive disease.  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  Family is going to consider discontinuing Keytruda . - Follow-up with oncology this week - Oncology palliative care following for ongoing GOC - Widespread bony mets likely causing his significant pain.  Continue with scheduled tramadol  outpatient.  Patient currently has adequate refills available   Transaminitis - Alk phos very elevated, mild elevations in AST.  This is likely secondary to malignancy infiltration into the liver. - No acute intervention required at this time. - Patient does not currently drink alcohol   Metabolic acidosis due to CKD stage IIIb - Baseline creatinine near 1.6 - CO2 16 on arrival, improved to normal now - Continue to monitor kidney function - Status post IV bicarb   Anorexia Severe malnutrition - Secondary to malignancy - Currently on Megace, continue -  Dietitian consult - Continue supplements   Constipation - Increase lactulose   Depression - Continue Remeron   Hypotension - On midodrine and doing better  now. - Suspect he will require increasing opioids as his cancer progresses.  Will need continued blood pressure support to allow for adequate pain control.  Midodrine prescribed at DC.   Thrombocytopenia - Platelets down trended to 17, this may be secondary to patient's Keytruda  though may also be complicated by bone marrow infiltration - Discussed with HemeOnc today and pt received 1u plts.    Tobacco abuse - Patient is a current smoker - Continue without nicotine patch for now per pt preference -- has been counseled on cessation Discharge Instructions  Discharge Instructions     Call MD for:  difficulty breathing, headache or visual disturbances   Complete by: As directed    Call MD for:  persistant dizziness or light-headedness   Complete by: As directed    Call MD for:  persistant nausea and vomiting   Complete by: As directed    Call MD for:  severe uncontrolled pain   Complete by: As directed    Call MD for:  temperature >100.4   Complete by: As directed    Care order/instruction   Complete by: As directed    Transfuse Parameters   Diet general   Complete by: As directed    Discharge instructions   Complete by: As directed    Please follow-up closely with your oncologist and primary care physician to discuss medication changes during this admission.   Increase activity slowly   Complete by: As directed    Type and screen   Complete by: As directed           Allergies as of 04/13/2024       Reactions   Nsaids Other (See Comments)   GI upset   Other Other (See Comments)   Anti-inflammatories cause GI upset, N/V        Medication List     TAKE these medications    albuterol  108 (90 Base) MCG/ACT inhaler Commonly known as: VENTOLIN  HFA Inhale 2 puffs into the lungs every 6 (six) hours as needed for wheezing or shortness of breath.   cefpodoxime 200 MG tablet Commonly known as: VANTIN Take 1 tablet (200 mg total) by mouth 2 (two) times daily for 5  days.   megestrol 40 MG tablet Commonly known as: MEGACE Take 1 tablet (40 mg total) by mouth daily.   midodrine 5 MG tablet Commonly known as: PROAMATINE Take 1 tablet (5 mg total) by mouth 3 (three) times daily with meals.   mirtazapine 7.5 MG tablet Commonly known as: REMERON Take 1 tablet (7.5 mg total) by mouth at bedtime.   traMADol  50 MG tablet Commonly known as: ULTRAM  Take 2 tablets (100 mg total) by mouth every 6 (six) hours as needed.        Follow-up Information     [name], MD Follow up.   Specialty: Family Medicine Why: hospital follow up Contact information: [address] [facility] [place] [address] [phone]                Allergies  Allergen Reactions   Nsaids Other (See Comments)    GI upset   Other Other (See Comments)    Anti-inflammatories cause GI upset, N/V    Consultations: Treatment Team:  [name], MD   Procedures/Studies: MR BRAIN WO CONTRAST Result Date: 04/12/2024 EXAM:  MRI BRAIN WITHOUT CONTRAST 04/12/2024 02:20:00 PM TECHNIQUE: Multiplanar multisequence MRI of the head/brain was performed without the administration of intravenous contrast. COMPARISON: None available. CLINICAL HISTORY: Metastatic disease evaluation; Has metastatic bladder CA, concern to have spread to the brain. Has CKD, cannot tolerate contrast. FINDINGS: BRAIN AND VENTRICLES: Multifocal hyperintense T2-weighted signal within the cerebral white matter, most commonly due to chronic small vessel disease. No acute infarct. No intracranial hemorrhage. No mass. No midline shift. No hydrocephalus. No cerebral edema. Assessment for metastases otherwise limited by lack of intravenous contrast agent. The sella is unremarkable. Normal flow voids. ORBITS: No acute abnormality. SINUSES AND MASTOIDS: Bilateral mastoid effusions. Nasopharynx is clear. BONES AND SOFT TISSUES: Normal marrow signal. No acute soft tissue abnormality.  IMPRESSION: 1. No acute intracranial abnormality. 2. Multifocal T2 white matter hyperintensities, most consistent with chronic small vessel disease. 3. Bilateral mastoid effusions. 4. Evaluation for metastases is limited without intravenous contrast. Electronically signed by: Franky Stanford MD 04/12/2024 03:07 PM EDT RP Workstation: [id number]   CT CHEST ABDOMEN PELVIS WO CONTRAST Result Date: 04/10/2024 CLINICAL DATA:  Metastatic disease evaluation. Malignant neoplasm of urinary bladder. EXAM: CT CHEST, ABDOMEN AND PELVIS WITHOUT CONTRAST TECHNIQUE: Multidetector CT imaging of the chest, abdomen and pelvis was performed following the standard protocol without IV contrast. RADIATION DOSE REDUCTION: This exam was performed according to the departmental dose-optimization program which includes automated exposure control, adjustment of the mA and/or kV according to patient size and/or use of iterative reconstruction technique. COMPARISON:  CT dated 01/26/2024. FINDINGS: Evaluation of this exam is limited in the absence of intravenous contrast. CT CHEST FINDINGS Cardiovascular: There is no cardiomegaly or pericardial effusion. Three-vessel coronary vascular calcification. The ascending aorta is dilated measuring up to 4.2 cm. There is mild atherosclerotic calcification of the thoracic aorta. The central pulmonary arteries are grossly unremarkable. Mediastinum/Nodes: No hilar or mediastinal adenopathy. The esophagus is grossly unremarkable. No mediastinal fluid collection. Right-sided Port-A-Cath with tip in the region of the cavoatrial junction. Lungs/Pleura: Background of emphysema. Small bilateral pleural effusions. No consolidative changes or pneumothorax. Several small scattered calcified granuloma. The central airways are patent. Musculoskeletal: Degenerative changes of the spine. Extensive diffuse heterogeneous and predominantly sclerotic changes of the skeleton significantly progressed since the prior CT  consistent with metastatic disease. CT ABDOMEN PELVIS FINDINGS No intra-abdominal free air.  Small ascites. Hepatobiliary: Innumerable hepatic hypodense lesions, significantly progressed since the prior CT consistent with progression of hepatic metastasis. Evaluation is limited in the absence of intravenous contrast. No biliary dilatation. The gallbladder is unremarkable. Pancreas: Unremarkable. No pancreatic ductal dilatation or surrounding inflammatory changes. Spleen: Normal in size without focal abnormality. Adrenals/Urinary Tract: The adrenal glands unremarkable. There is no hydronephrosis or nephrolithiasis on either side. Mild bilateral hydroureter. Status post cystectomy and right lower quadrant ileal conduit. Stomach/Bowel: There is no bowel obstruction or active inflammation. Postsurgical changes with bowel. No evidence of acute appendicitis. Vascular/Lymphatic: Advanced aortoiliac atherosclerotic disease. There is a 2.7 cm infrarenal aortic ectasia. The IVC is unremarkable. No portal venous gas. There is no adenopathy. Reproductive: Prostatectomy. Other: None Musculoskeletal: Extensive osteosclerosis consistent with metastatic disease. No acute osseous pathology. IMPRESSION: 1. Interval progression of hepatic and osseous metastatic disease. 2. Status post cystectomy and right lower quadrant ileal conduit. Mild bilateral hydroureter. No hydronephrosis. 3. Small bilateral pleural effusions. 4. No bowel obstruction. 5.  Aortic Atherosclerosis (ICD10-I70.0). Electronically Signed   By: Vanetta Chou M.D.   On: 04/10/2024 16:21   DG Chest Port 1 View Result Date: 04/07/2024 CLINICAL  DATA:  Bladder cancer, weakness. EXAM: PORTABLE CHEST 1 VIEW COMPARISON:  12/28/2023 and CT chest 01/26/2024. FINDINGS: Trachea is midline. Heart size normal. Right IJ power port tip is in the SVC. Nodular density is seen in the right upper lobe, superimposed with the chest port. Calcified granuloma in the right lower lobe.  Lungs are otherwise clear. No pleural fluid. IMPRESSION: 1. New right upper lobe nodule, worrisome for metastatic disease. 2. Otherwise, no acute findings. Electronically Signed   By: [name] Eke M.D.   On: 04/07/2024 14:59   CT Head Wo Contrast Result Date: 04/07/2024 EXAM: CT HEAD WITHOUT CONTRAST 04/07/2024 02:38:54 PM TECHNIQUE: CT of the head was performed without the administration of intravenous contrast. Automated exposure control, iterative reconstruction, and/or weight based adjustment of the mA/kV was utilized to reduce the radiation dose to as low as reasonably achievable. COMPARISON: None available. CLINICAL HISTORY: Mental status change, unknown cause. Pt has stage four bladder cancer and is currently taking chemo treatments. Per wife pt has been talking gibberish since Friday. FINDINGS: BRAIN AND VENTRICLES: No acute hemorrhage. No evidence of acute infarct. No hydrocephalus. No extra-axial collection. No mass effect or midline shift. ORBITS: No acute abnormality. SINUSES: No acute abnormality. SOFT TISSUES AND SKULL: No acute soft tissue abnormality. No skull fracture. IMPRESSION: 1. No acute intracranial abnormality. Electronically signed by: Gilmore Molt MD 04/07/2024 02:45 PM EDT RP Workstation: [id number]      Discharge Exam: Vitals:   04/13/24 1126 04/13/24 1338  BP: 115/70 112/68  Pulse: 96 96  Resp: 18 18  Temp: 98.1 F (36.7 C) 98 F (36.7 C)  SpO2: 98% 98%   Vitals:   04/13/24 0803 04/13/24 1102 04/13/24 1126 04/13/24 1338  BP: (!) 119/57 119/80 115/70 112/68  Pulse: 92 93 96 96  Resp: 16 16 18 18   Temp: 98.1 F (36.7 C) 98.3 F (36.8 C) 98.1 F (36.7 C) 98 F (36.7 C)  TempSrc:  Oral Oral Oral  SpO2: 96% 100% 98% 98%  Height:        ral exam: Appears calm and comfortable, NAD, thin Respiratory system: No work of breathing, symmetric chest wall expansion Cardiovascular system: S1 & S2 heard, RRR.  Gastrointestinal system: Abdomen is  nondistended, soft and nontender. Urostomy in place. Neuro: Alert and oriented.  Drowsy but easily arousable.  Speech incoherent at times.  Appears to have good grasp and understanding of current situation. Extremities: Symmetric, expected ROM Skin: Bruises and skin lesions of various stages of healing  The results of significant diagnostics from this hospitalization (including imaging, microbiology, ancillary and laboratory) are listed below for reference.     Microbiology: Recent Results (from the past 240 hours)  Urine Culture     Status: Abnormal   Collection Time: 04/07/24  2:07 PM   Specimen: Urine, Random  Result Value Ref Range Status   Specimen Description   Final    URINE, RANDOM Performed at [facility], [address] [place], [place] [address]    Special Requests   Final    NONE Reflexed from [id number] Performed at [facility], [address] [place], [place] [address]    Culture (A)  Final    60,000 COLONIES/mL KLEBSIELLA OXYTOCA >=100,000 COLONIES/mL PROTEUS MIRABILIS    Report Status 04/10/2024 FINAL  Final   Organism ID, Bacteria KLEBSIELLA OXYTOCA (A)  Final   Organism ID, Bacteria PROTEUS MIRABILIS (A)  Final      Susceptibility   Klebsiella oxytoca - MIC*  AMPICILLIN RESISTANT Resistant     CEFEPIME <=0.12 SENSITIVE Sensitive     ERTAPENEM <=0.12 SENSITIVE Sensitive     CEFTRIAXONE  <=0.25 SENSITIVE Sensitive     CIPROFLOXACIN  <=0.06 SENSITIVE Sensitive     GENTAMICIN <=1 SENSITIVE Sensitive     NITROFURANTOIN 32 SENSITIVE Sensitive     TRIMETH/SULFA <=20 SENSITIVE Sensitive     AMPICILLIN/SULBACTAM 4 SENSITIVE Sensitive     PIP/TAZO Value in next row Sensitive      <=4 SENSITIVEThis is a modified FDA-approved test that has been validated and its performance characteristics determined by the reporting laboratory.  This laboratory is certified under the Clinical Laboratory Improvement Amendments CLIA as qualified to perform high  complexity clinical laboratory testing.    MEROPENEM Value in next row Sensitive      <=4 SENSITIVEThis is a modified FDA-approved test that has been validated and its performance characteristics determined by the reporting laboratory.  This laboratory is certified under the Clinical Laboratory Improvement Amendments CLIA as qualified to perform high complexity clinical laboratory testing.    * 60,000 COLONIES/mL KLEBSIELLA OXYTOCA   Proteus mirabilis - MIC*    AMPICILLIN Value in next row Sensitive      <=4 SENSITIVEThis is a modified FDA-approved test that has been validated and its performance characteristics determined by the reporting laboratory.  This laboratory is certified under the Clinical Laboratory Improvement Amendments CLIA as qualified to perform high complexity clinical laboratory testing.    CEFAZOLIN  (URINE) Value in next row Sensitive      4 SENSITIVEThis is a modified FDA-approved test that has been validated and its performance characteristics determined by the reporting laboratory.  This laboratory is certified under the Clinical Laboratory Improvement Amendments CLIA as qualified to perform high complexity clinical laboratory testing.    CEFEPIME Value in next row Sensitive      4 SENSITIVEThis is a modified FDA-approved test that has been validated and its performance characteristics determined by the reporting laboratory.  This laboratory is certified under the Clinical Laboratory Improvement Amendments CLIA as qualified to perform high complexity clinical laboratory testing.    ERTAPENEM Value in next row Sensitive      4 SENSITIVEThis is a modified FDA-approved test that has been validated and its performance characteristics determined by the reporting laboratory.  This laboratory is certified under the Clinical Laboratory Improvement Amendments CLIA as qualified to perform high complexity clinical laboratory testing.    CEFTRIAXONE  Value in next row Sensitive      4  SENSITIVEThis is a modified FDA-approved test that has been validated and its performance characteristics determined by the reporting laboratory.  This laboratory is certified under the Clinical Laboratory Improvement Amendments CLIA as qualified to perform high complexity clinical laboratory testing.    CIPROFLOXACIN  Value in next row Sensitive      4 SENSITIVEThis is a modified FDA-approved test that has been validated and its performance characteristics determined by the reporting laboratory.  This laboratory is certified under the Clinical Laboratory Improvement Amendments CLIA as qualified to perform high complexity clinical laboratory testing.    GENTAMICIN Value in next row Sensitive      4 SENSITIVEThis is a modified FDA-approved test that has been validated and its performance characteristics determined by the reporting laboratory.  This laboratory is certified under the Clinical Laboratory Improvement Amendments CLIA as qualified to perform high complexity clinical laboratory testing.    NITROFURANTOIN Value in next row Resistant      4 SENSITIVEThis is a modified FDA-approved  test that has been validated and its performance characteristics determined by the reporting laboratory.  This laboratory is certified under the Clinical Laboratory Improvement Amendments CLIA as qualified to perform high complexity clinical laboratory testing.    TRIMETH/SULFA Value in next row Sensitive      4 SENSITIVEThis is a modified FDA-approved test that has been validated and its performance characteristics determined by the reporting laboratory.  This laboratory is certified under the Clinical Laboratory Improvement Amendments CLIA as qualified to perform high complexity clinical laboratory testing.    AMPICILLIN/SULBACTAM Value in next row Sensitive      4 SENSITIVEThis is a modified FDA-approved test that has been validated and its performance characteristics determined by the reporting laboratory.  This  laboratory is certified under the Clinical Laboratory Improvement Amendments CLIA as qualified to perform high complexity clinical laboratory testing.    PIP/TAZO Value in next row Sensitive      <=4 SENSITIVEThis is a modified FDA-approved test that has been validated and its performance characteristics determined by the reporting laboratory.  This laboratory is certified under the Clinical Laboratory Improvement Amendments CLIA as qualified to perform high complexity clinical laboratory testing.    MEROPENEM Value in next row Sensitive      <=4 SENSITIVEThis is a modified FDA-approved test that has been validated and its performance characteristics determined by the reporting laboratory.  This laboratory is certified under the Clinical Laboratory Improvement Amendments CLIA as qualified to perform high complexity clinical laboratory testing.    * >=100,000 COLONIES/mL PROTEUS MIRABILIS  Urine Culture     Status: Abnormal   Collection Time: 04/09/24 11:33 AM   Specimen: Urine, Random  Result Value Ref Range Status   Specimen Description   Final    URINE, RANDOM Performed at [facility], [address], [place], [place] [address]    Special Requests   Final    NONE Performed at [facility], [address] [place], [place] [address]    Culture 80,000 COLONIES/mL PROTEUS MIRABILIS (A)  Final   Report Status 04/11/2024 FINAL  Final   Organism ID, Bacteria PROTEUS MIRABILIS (A)  Final      Susceptibility   Proteus mirabilis - MIC*    AMPICILLIN <=2 SENSITIVE Sensitive     CEFAZOLIN  (URINE) Value in next row Sensitive      4 SENSITIVEThis is a modified FDA-approved test that has been validated and its performance characteristics determined by the reporting laboratory.  This laboratory is certified under the Clinical Laboratory Improvement Amendments CLIA as qualified to perform high complexity clinical laboratory testing.    CEFEPIME Value in next row Sensitive      4  SENSITIVEThis is a modified FDA-approved test that has been validated and its performance characteristics determined by the reporting laboratory.  This laboratory is certified under the Clinical Laboratory Improvement Amendments CLIA as qualified to perform high complexity clinical laboratory testing.    ERTAPENEM Value in next row Sensitive      4 SENSITIVEThis is a modified FDA-approved test that has been validated and its performance characteristics determined by the reporting laboratory.  This laboratory is certified under the Clinical Laboratory Improvement Amendments CLIA as qualified to perform high complexity clinical laboratory testing.    CEFTRIAXONE  Value in next row Sensitive      4 SENSITIVEThis is a modified FDA-approved test that has been validated and its performance characteristics determined by the reporting laboratory.  This laboratory is certified under the Clinical Laboratory Improvement Amendments  CLIA as qualified to perform high complexity clinical laboratory testing.    CIPROFLOXACIN  Value in next row Sensitive      4 SENSITIVEThis is a modified FDA-approved test that has been validated and its performance characteristics determined by the reporting laboratory.  This laboratory is certified under the Clinical Laboratory Improvement Amendments CLIA as qualified to perform high complexity clinical laboratory testing.    GENTAMICIN Value in next row Sensitive      4 SENSITIVEThis is a modified FDA-approved test that has been validated and its performance characteristics determined by the reporting laboratory.  This laboratory is certified under the Clinical Laboratory Improvement Amendments CLIA as qualified to perform high complexity clinical laboratory testing.    NITROFURANTOIN Value in next row Resistant      4 SENSITIVEThis is a modified FDA-approved test that has been validated and its performance characteristics determined by the reporting laboratory.  This laboratory is  certified under the Clinical Laboratory Improvement Amendments CLIA as qualified to perform high complexity clinical laboratory testing.    TRIMETH/SULFA Value in next row Sensitive      4 SENSITIVEThis is a modified FDA-approved test that has been validated and its performance characteristics determined by the reporting laboratory.  This laboratory is certified under the Clinical Laboratory Improvement Amendments CLIA as qualified to perform high complexity clinical laboratory testing.    AMPICILLIN/SULBACTAM Value in next row Sensitive      4 SENSITIVEThis is a modified FDA-approved test that has been validated and its performance characteristics determined by the reporting laboratory.  This laboratory is certified under the Clinical Laboratory Improvement Amendments CLIA as qualified to perform high complexity clinical laboratory testing.    PIP/TAZO Value in next row Sensitive      <=4 SENSITIVEThis is a modified FDA-approved test that has been validated and its performance characteristics determined by the reporting laboratory.  This laboratory is certified under the Clinical Laboratory Improvement Amendments CLIA as qualified to perform high complexity clinical laboratory testing.    MEROPENEM Value in next row Sensitive      <=4 SENSITIVEThis is a modified FDA-approved test that has been validated and its performance characteristics determined by the reporting laboratory.  This laboratory is certified under the Clinical Laboratory Improvement Amendments CLIA as qualified to perform high complexity clinical laboratory testing.    * 80,000 COLONIES/mL PROTEUS MIRABILIS  Urine Culture (for pregnant, neutropenic or urologic patients or patients with an indwelling urinary catheter)     Status: Abnormal   Collection Time: 04/10/24  3:28 PM   Specimen: Urine, Bag (ped)  Result Value Ref Range Status   Specimen Description   Final    URINE, BAG PED Performed at [facility], [address] [place], [place] [address]    Special Requests   Final    NONE Performed at [facility], [address] [place], [place] [address]    Culture (A)  Final    >=100,000 COLONIES/mL PROTEUS MIRABILIS 70,000 COLONIES/mL KLEBSIELLA OXYTOCA    Report Status 04/13/2024 FINAL  Final   Organism ID, Bacteria PROTEUS MIRABILIS (A)  Final   Organism ID, Bacteria KLEBSIELLA OXYTOCA (A)  Final      Susceptibility   Klebsiella oxytoca - MIC*    AMPICILLIN RESISTANT Resistant     CEFEPIME <=0.12 SENSITIVE Sensitive     ERTAPENEM <=0.12 SENSITIVE Sensitive     CEFTRIAXONE  <=0.25 SENSITIVE Sensitive     CIPROFLOXACIN  <=0.06 SENSITIVE Sensitive     GENTAMICIN <=1 SENSITIVE  Sensitive     NITROFURANTOIN 32 SENSITIVE Sensitive     TRIMETH/SULFA <=20 SENSITIVE Sensitive     AMPICILLIN/SULBACTAM 8 SENSITIVE Sensitive     PIP/TAZO Value in next row Sensitive      <=4 SENSITIVEThis is a modified FDA-approved test that has been validated and its performance characteristics determined by the reporting laboratory.  This laboratory is certified under the Clinical Laboratory Improvement Amendments CLIA as qualified to perform high complexity clinical laboratory testing.    MEROPENEM Value in next row Sensitive      <=4 SENSITIVEThis is a modified FDA-approved test that has been validated and its performance characteristics determined by the reporting laboratory.  This laboratory is certified under the Clinical Laboratory Improvement Amendments CLIA as qualified to perform high complexity clinical laboratory testing.    * 70,000 COLONIES/mL KLEBSIELLA OXYTOCA   Proteus mirabilis - MIC*    AMPICILLIN Value in next row Sensitive      <=4 SENSITIVEThis is a modified FDA-approved test that has been validated and its performance characteristics determined by the reporting laboratory.  This laboratory is certified under the Clinical Laboratory Improvement Amendments CLIA as qualified to perform high complexity  clinical laboratory testing.    CEFAZOLIN  (URINE) Value in next row Sensitive      4 SENSITIVEThis is a modified FDA-approved test that has been validated and its performance characteristics determined by the reporting laboratory.  This laboratory is certified under the Clinical Laboratory Improvement Amendments CLIA as qualified to perform high complexity clinical laboratory testing.    CEFEPIME Value in next row Sensitive      4 SENSITIVEThis is a modified FDA-approved test that has been validated and its performance characteristics determined by the reporting laboratory.  This laboratory is certified under the Clinical Laboratory Improvement Amendments CLIA as qualified to perform high complexity clinical laboratory testing.    ERTAPENEM Value in next row Sensitive      4 SENSITIVEThis is a modified FDA-approved test that has been validated and its performance characteristics determined by the reporting laboratory.  This laboratory is certified under the Clinical Laboratory Improvement Amendments CLIA as qualified to perform high complexity clinical laboratory testing.    CEFTRIAXONE  Value in next row Sensitive      4 SENSITIVEThis is a modified FDA-approved test that has been validated and its performance characteristics determined by the reporting laboratory.  This laboratory is certified under the Clinical Laboratory Improvement Amendments CLIA as qualified to perform high complexity clinical laboratory testing.    CIPROFLOXACIN  Value in next row Sensitive      4 SENSITIVEThis is a modified FDA-approved test that has been validated and its performance characteristics determined by the reporting laboratory.  This laboratory is certified under the Clinical Laboratory Improvement Amendments CLIA as qualified to perform high complexity clinical laboratory testing.    GENTAMICIN Value in next row Sensitive      4 SENSITIVEThis is a modified FDA-approved test that has been validated and its performance  characteristics determined by the reporting laboratory.  This laboratory is certified under the Clinical Laboratory Improvement Amendments CLIA as qualified to perform high complexity clinical laboratory testing.    NITROFURANTOIN Value in next row Resistant      4 SENSITIVEThis is a modified FDA-approved test that has been validated and its performance characteristics determined by the reporting laboratory.  This laboratory is certified under the Clinical Laboratory Improvement Amendments CLIA as qualified to perform high complexity clinical laboratory testing.    TRIMETH/SULFA Value in next  row Sensitive      4 SENSITIVEThis is a modified FDA-approved test that has been validated and its performance characteristics determined by the reporting laboratory.  This laboratory is certified under the Clinical Laboratory Improvement Amendments CLIA as qualified to perform high complexity clinical laboratory testing.    AMPICILLIN/SULBACTAM Value in next row Sensitive      4 SENSITIVEThis is a modified FDA-approved test that has been validated and its performance characteristics determined by the reporting laboratory.  This laboratory is certified under the Clinical Laboratory Improvement Amendments CLIA as qualified to perform high complexity clinical laboratory testing.    PIP/TAZO Value in next row Sensitive      <=4 SENSITIVEThis is a modified FDA-approved test that has been validated and its performance characteristics determined by the reporting laboratory.  This laboratory is certified under the Clinical Laboratory Improvement Amendments CLIA as qualified to perform high complexity clinical laboratory testing.    MEROPENEM Value in next row Sensitive      <=4 SENSITIVEThis is a modified FDA-approved test that has been validated and its performance characteristics determined by the reporting laboratory.  This laboratory is certified under the Clinical Laboratory Improvement Amendments CLIA as qualified to  perform high complexity clinical laboratory testing.    * >=100,000 COLONIES/mL PROTEUS MIRABILIS  Culture, blood (Routine X 2) w Reflex to ID Panel     Status: None (Preliminary result)   Collection Time: 04/11/24  8:39 AM   Specimen: Right Antecubital; Blood  Result Value Ref Range Status   Specimen Description RIGHT ANTECUBITAL  Final   Special Requests   Final    BOTTLES DRAWN AEROBIC AND ANAEROBIC Blood Culture adequate volume   Culture   Final    NO GROWTH 2 DAYS Performed at [facility], [address] [place], [place] [address]    Report Status PENDING  Incomplete  Culture, blood (Routine X 2) w Reflex to ID Panel     Status: None (Preliminary result)   Collection Time: 04/11/24  8:39 AM   Specimen: BLOOD RIGHT HAND  Result Value Ref Range Status   Specimen Description BLOOD RIGHT HAND  Final   Special Requests   Final    BOTTLES DRAWN AEROBIC AND ANAEROBIC Blood Culture adequate volume   Culture   Final    NO GROWTH 2 DAYS Performed at [facility], [address], [place], [place] [address]    Report Status PENDING  Incomplete     Labs: BNP (last 3 results) No results for input(s): BNP in the last 8760 hours. Basic Metabolic Panel: Recent Labs  Lab 04/07/24 1407 04/08/24 1034 04/10/24 0750 04/10/24 1241 04/11/24 0341 04/12/24 0428 04/13/24 0709  NA 136   < > 137 136 135 139 139  K 4.8   < > 4.3 4.4 3.7 3.7 3.6  CL 103   < > 108 106 106 106 104  CO2 19*   < > 16* 18* 20* 22 19*  GLUCOSE 109*   < > 100* 82 102* 99 83  BUN 44*   < > 40* 43* 32* 33* 32*  CREATININE 1.87*   < > 1.79* 1.89* 1.40* 1.77* 1.61*  CALCIUM  8.0*   < > 8.0* 7.8* 7.6* 8.0* 8.1*  MG 2.2  --   --  2.3 2.1 2.4 2.4  PHOS  --   --   --  3.8 2.6 3.3 2.7   < > = values in this interval not displayed.   Liver Function  Tests: Recent Labs  Lab 04/08/24 1034 04/10/24 0750 04/11/24 0341 04/12/24 0428 04/13/24 0709  AST 55* 63* 60* 62* 55*  ALT 29 30 29  32 30   ALKPHOS 1,319* 1,894* 1,585* 1,737* 1,649*  BILITOT 1.3* 1.3* 1.1 1.1 1.2  PROT 6.2* 6.1* 5.3* 5.6* 5.5*  ALBUMIN  2.3* 2.2* 1.9* 2.2* 2.0*   No results for input(s): LIPASE, AMYLASE in the last 168 hours. Recent Labs  Lab 04/11/24 1541  AMMONIA 16   CBC: Recent Labs  Lab 04/08/24 1034 04/10/24 0750 04/10/24 1241 04/11/24 0341 04/12/24 0428 04/13/24 0709  WBC 10.5 10.0 8.4 8.3 10.7* 7.9  NEUTROABS 7.1 6.2  --   --  6.9 5.2  HGB 7.7* 7.8* 8.4* 7.9* 8.5* 8.1*  HCT 23.9* 23.9* 26.1* 25.0* 26.0* 24.9*  MCV 91.9 92.3 89.1 89.6 88.1 88.3  PLT 38* 35* 27* 23* 24* 17*   Cardiac Enzymes: No results for input(s): CKTOTAL, CKMB, CKMBINDEX, TROPONINI in the last 168 hours. BNP: Invalid input(s): POCBNP CBG: No results for input(s): GLUCAP in the last 168 hours. D-Dimer No results for input(s): DDIMER in the last 72 hours. Hgb A1c No results for input(s): HGBA1C in the last 72 hours. Lipid Profile No results for input(s): CHOL, HDL, LDLCALC, TRIG, CHOLHDL, LDLDIRECT in the last 72 hours. Thyroid  function studies No results for input(s): TSH, T4TOTAL, T3FREE, THYROIDAB in the last 72 hours.  Invalid input(s): FREET3 Anemia work up No results for input(s): VITAMINB12, FOLATE, FERRITIN, TIBC, IRON, RETICCTPCT in the last 72 hours. Urinalysis    Component Value Date/Time   COLORURINE YELLOW (A) 04/10/2024 1528   APPEARANCEUR CLOUDY (A) 04/10/2024 1528   APPEARANCEUR Hazy (A) 11/15/2022 1338   LABSPEC 1.015 04/10/2024 1528   PHURINE 7.0 04/10/2024 1528   GLUCOSEU NEGATIVE 04/10/2024 1528   HGBUR SMALL (A) 04/10/2024 1528   BILIRUBINUR NEGATIVE 04/10/2024 1528   BILIRUBINUR Negative 11/15/2022 1338   KETONESUR NEGATIVE 04/10/2024 1528   PROTEINUR 100 (A) 04/10/2024 1528   NITRITE POSITIVE (A) 04/10/2024 1528   LEUKOCYTESUR LARGE (A) 04/10/2024 1528   Sepsis Labs Recent Labs  Lab 04/10/24 1241 04/11/24 0341  04/12/24 0428 04/13/24 0709  WBC 8.4 8.3 10.7* 7.9   Microbiology Recent Results (from the past 240 hours)  Urine Culture     Status: Abnormal   Collection Time: 04/07/24  2:07 PM   Specimen: Urine, Random  Result Value Ref Range Status   Specimen Description   Final    URINE, RANDOM Performed at [facility], [address] [place], [place] [address]    Special Requests   Final    NONE Reflexed from [phone] Performed at [facility], [address] [place], [place] [address]    Culture (A)  Final    60,000 COLONIES/mL KLEBSIELLA OXYTOCA >=100,000 COLONIES/mL PROTEUS MIRABILIS    Report Status 04/10/2024 FINAL  Final   Organism ID, Bacteria KLEBSIELLA OXYTOCA (A)  Final   Organism ID, Bacteria PROTEUS MIRABILIS (A)  Final      Susceptibility   Klebsiella oxytoca - MIC*    AMPICILLIN RESISTANT Resistant     CEFEPIME <=0.12 SENSITIVE Sensitive     ERTAPENEM <=0.12 SENSITIVE Sensitive     CEFTRIAXONE  <=0.25 SENSITIVE Sensitive     CIPROFLOXACIN  <=0.06 SENSITIVE Sensitive     GENTAMICIN <=1 SENSITIVE Sensitive     NITROFURANTOIN 32 SENSITIVE Sensitive     TRIMETH/SULFA <=20 SENSITIVE Sensitive     AMPICILLIN/SULBACTAM 4 SENSITIVE Sensitive     PIP/TAZO Value  in next row Sensitive      <=4 SENSITIVEThis is a modified FDA-approved test that has been validated and its performance characteristics determined by the reporting laboratory.  This laboratory is certified under the Clinical Laboratory Improvement Amendments CLIA as qualified to perform high complexity clinical laboratory testing.    MEROPENEM Value in next row Sensitive      <=4 SENSITIVEThis is a modified FDA-approved test that has been validated and its performance characteristics determined by the reporting laboratory.  This laboratory is certified under the Clinical Laboratory Improvement Amendments CLIA as qualified to perform high complexity clinical laboratory testing.    * 60,000 COLONIES/mL  KLEBSIELLA OXYTOCA   Proteus mirabilis - MIC*    AMPICILLIN Value in next row Sensitive      <=4 SENSITIVEThis is a modified FDA-approved test that has been validated and its performance characteristics determined by the reporting laboratory.  This laboratory is certified under the Clinical Laboratory Improvement Amendments CLIA as qualified to perform high complexity clinical laboratory testing.    CEFAZOLIN  (URINE) Value in next row Sensitive      4 SENSITIVEThis is a modified FDA-approved test that has been validated and its performance characteristics determined by the reporting laboratory.  This laboratory is certified under the Clinical Laboratory Improvement Amendments CLIA as qualified to perform high complexity clinical laboratory testing.    CEFEPIME Value in next row Sensitive      4 SENSITIVEThis is a modified FDA-approved test that has been validated and its performance characteristics determined by the reporting laboratory.  This laboratory is certified under the Clinical Laboratory Improvement Amendments CLIA as qualified to perform high complexity clinical laboratory testing.    ERTAPENEM Value in next row Sensitive      4 SENSITIVEThis is a modified FDA-approved test that has been validated and its performance characteristics determined by the reporting laboratory.  This laboratory is certified under the Clinical Laboratory Improvement Amendments CLIA as qualified to perform high complexity clinical laboratory testing.    CEFTRIAXONE  Value in next row Sensitive      4 SENSITIVEThis is a modified FDA-approved test that has been validated and its performance characteristics determined by the reporting laboratory.  This laboratory is certified under the Clinical Laboratory Improvement Amendments CLIA as qualified to perform high complexity clinical laboratory testing.    CIPROFLOXACIN  Value in next row Sensitive      4 SENSITIVEThis is a modified FDA-approved test that has been validated  and its performance characteristics determined by the reporting laboratory.  This laboratory is certified under the Clinical Laboratory Improvement Amendments CLIA as qualified to perform high complexity clinical laboratory testing.    GENTAMICIN Value in next row Sensitive      4 SENSITIVEThis is a modified FDA-approved test that has been validated and its performance characteristics determined by the reporting laboratory.  This laboratory is certified under the Clinical Laboratory Improvement Amendments CLIA as qualified to perform high complexity clinical laboratory testing.    NITROFURANTOIN Value in next row Resistant      4 SENSITIVEThis is a modified FDA-approved test that has been validated and its performance characteristics determined by the reporting laboratory.  This laboratory is certified under the Clinical Laboratory Improvement Amendments CLIA as qualified to perform high complexity clinical laboratory testing.    TRIMETH/SULFA Value in next row Sensitive      4 SENSITIVEThis is a modified FDA-approved test that has been validated and its performance characteristics determined by the reporting laboratory.  This laboratory is  certified under the Clinical Laboratory Improvement Amendments CLIA as qualified to perform high complexity clinical laboratory testing.    AMPICILLIN/SULBACTAM Value in next row Sensitive      4 SENSITIVEThis is a modified FDA-approved test that has been validated and its performance characteristics determined by the reporting laboratory.  This laboratory is certified under the Clinical Laboratory Improvement Amendments CLIA as qualified to perform high complexity clinical laboratory testing.    PIP/TAZO Value in next row Sensitive      <=4 SENSITIVEThis is a modified FDA-approved test that has been validated and its performance characteristics determined by the reporting laboratory.  This laboratory is certified under the Clinical Laboratory Improvement Amendments  CLIA as qualified to perform high complexity clinical laboratory testing.    MEROPENEM Value in next row Sensitive      <=4 SENSITIVEThis is a modified FDA-approved test that has been validated and its performance characteristics determined by the reporting laboratory.  This laboratory is certified under the Clinical Laboratory Improvement Amendments CLIA as qualified to perform high complexity clinical laboratory testing.    * >=100,000 COLONIES/mL PROTEUS MIRABILIS  Urine Culture     Status: Abnormal   Collection Time: 04/09/24 11:33 AM   Specimen: Urine, Random  Result Value Ref Range Status   Specimen Description   Final    URINE, RANDOM Performed at [facility], [address] [place], [place] [address]    Special Requests   Final    NONE Performed at [facility], [address] [place], [place] [address]    Culture 80,000 COLONIES/mL PROTEUS MIRABILIS (A)  Final   Report Status 04/11/2024 FINAL  Final   Organism ID, Bacteria PROTEUS MIRABILIS (A)  Final      Susceptibility   Proteus mirabilis - MIC*    AMPICILLIN <=2 SENSITIVE Sensitive     CEFAZOLIN  (URINE) Value in next row Sensitive      4 SENSITIVEThis is a modified FDA-approved test that has been validated and its performance characteristics determined by the reporting laboratory.  This laboratory is certified under the Clinical Laboratory Improvement Amendments CLIA as qualified to perform high complexity clinical laboratory testing.    CEFEPIME Value in next row Sensitive      4 SENSITIVEThis is a modified FDA-approved test that has been validated and its performance characteristics determined by the reporting laboratory.  This laboratory is certified under the Clinical Laboratory Improvement Amendments CLIA as qualified to perform high complexity clinical laboratory testing.    ERTAPENEM Value in next row Sensitive      4 SENSITIVEThis is a modified FDA-approved test that has been validated and its  performance characteristics determined by the reporting laboratory.  This laboratory is certified under the Clinical Laboratory Improvement Amendments CLIA as qualified to perform high complexity clinical laboratory testing.    CEFTRIAXONE  Value in next row Sensitive      4 SENSITIVEThis is a modified FDA-approved test that has been validated and its performance characteristics determined by the reporting laboratory.  This laboratory is certified under the Clinical Laboratory Improvement Amendments CLIA as qualified to perform high complexity clinical laboratory testing.    CIPROFLOXACIN  Value in next row Sensitive      4 SENSITIVEThis is a modified FDA-approved test that has been validated and its performance characteristics determined by the reporting laboratory.  This laboratory is certified under the Clinical Laboratory Improvement Amendments CLIA as qualified to perform high complexity clinical laboratory testing.    GENTAMICIN Value in next row  Sensitive      4 SENSITIVEThis is a modified FDA-approved test that has been validated and its performance characteristics determined by the reporting laboratory.  This laboratory is certified under the Clinical Laboratory Improvement Amendments CLIA as qualified to perform high complexity clinical laboratory testing.    NITROFURANTOIN Value in next row Resistant      4 SENSITIVEThis is a modified FDA-approved test that has been validated and its performance characteristics determined by the reporting laboratory.  This laboratory is certified under the Clinical Laboratory Improvement Amendments CLIA as qualified to perform high complexity clinical laboratory testing.    TRIMETH/SULFA Value in next row Sensitive      4 SENSITIVEThis is a modified FDA-approved test that has been validated and its performance characteristics determined by the reporting laboratory.  This laboratory is certified under the Clinical Laboratory Improvement Amendments CLIA as qualified  to perform high complexity clinical laboratory testing.    AMPICILLIN/SULBACTAM Value in next row Sensitive      4 SENSITIVEThis is a modified FDA-approved test that has been validated and its performance characteristics determined by the reporting laboratory.  This laboratory is certified under the Clinical Laboratory Improvement Amendments CLIA as qualified to perform high complexity clinical laboratory testing.    PIP/TAZO Value in next row Sensitive      <=4 SENSITIVEThis is a modified FDA-approved test that has been validated and its performance characteristics determined by the reporting laboratory.  This laboratory is certified under the Clinical Laboratory Improvement Amendments CLIA as qualified to perform high complexity clinical laboratory testing.    MEROPENEM Value in next row Sensitive      <=4 SENSITIVEThis is a modified FDA-approved test that has been validated and its performance characteristics determined by the reporting laboratory.  This laboratory is certified under the Clinical Laboratory Improvement Amendments CLIA as qualified to perform high complexity clinical laboratory testing.    * 80,000 COLONIES/mL PROTEUS MIRABILIS  Urine Culture (for pregnant, neutropenic or urologic patients or patients with an indwelling urinary catheter)     Status: Abnormal   Collection Time: 04/10/24  3:28 PM   Specimen: Urine, Bag (ped)  Result Value Ref Range Status   Specimen Description   Final    URINE, BAG PED Performed at [facility], [address], [place], [place] [address]    Special Requests   Final    NONE Performed at [facility], [address] [place], [place] [address]    Culture (A)  Final    >=100,000 COLONIES/mL PROTEUS MIRABILIS 70,000 COLONIES/mL KLEBSIELLA OXYTOCA    Report Status 04/13/2024 FINAL  Final   Organism ID, Bacteria PROTEUS MIRABILIS (A)  Final   Organism ID, Bacteria KLEBSIELLA OXYTOCA (A)  Final      Susceptibility    Klebsiella oxytoca - MIC*    AMPICILLIN RESISTANT Resistant     CEFEPIME <=0.12 SENSITIVE Sensitive     ERTAPENEM <=0.12 SENSITIVE Sensitive     CEFTRIAXONE  <=0.25 SENSITIVE Sensitive     CIPROFLOXACIN  <=0.06 SENSITIVE Sensitive     GENTAMICIN <=1 SENSITIVE Sensitive     NITROFURANTOIN 32 SENSITIVE Sensitive     TRIMETH/SULFA <=20 SENSITIVE Sensitive     AMPICILLIN/SULBACTAM 8 SENSITIVE Sensitive     PIP/TAZO Value in next row Sensitive      <=4 SENSITIVEThis is a modified FDA-approved test that has been validated and its performance characteristics determined by the reporting laboratory.  This laboratory is certified under the Clinical Laboratory Improvement Amendments CLIA as  qualified to perform high complexity clinical laboratory testing.    MEROPENEM Value in next row Sensitive      <=4 SENSITIVEThis is a modified FDA-approved test that has been validated and its performance characteristics determined by the reporting laboratory.  This laboratory is certified under the Clinical Laboratory Improvement Amendments CLIA as qualified to perform high complexity clinical laboratory testing.    * 70,000 COLONIES/mL KLEBSIELLA OXYTOCA   Proteus mirabilis - MIC*    AMPICILLIN Value in next row Sensitive      <=4 SENSITIVEThis is a modified FDA-approved test that has been validated and its performance characteristics determined by the reporting laboratory.  This laboratory is certified under the Clinical Laboratory Improvement Amendments CLIA as qualified to perform high complexity clinical laboratory testing.    CEFAZOLIN  (URINE) Value in next row Sensitive      4 SENSITIVEThis is a modified FDA-approved test that has been validated and its performance characteristics determined by the reporting laboratory.  This laboratory is certified under the Clinical Laboratory Improvement Amendments CLIA as qualified to perform high complexity clinical laboratory testing.    CEFEPIME Value in next row Sensitive       4 SENSITIVEThis is a modified FDA-approved test that has been validated and its performance characteristics determined by the reporting laboratory.  This laboratory is certified under the Clinical Laboratory Improvement Amendments CLIA as qualified to perform high complexity clinical laboratory testing.    ERTAPENEM Value in next row Sensitive      4 SENSITIVEThis is a modified FDA-approved test that has been validated and its performance characteristics determined by the reporting laboratory.  This laboratory is certified under the Clinical Laboratory Improvement Amendments CLIA as qualified to perform high complexity clinical laboratory testing.    CEFTRIAXONE  Value in next row Sensitive      4 SENSITIVEThis is a modified FDA-approved test that has been validated and its performance characteristics determined by the reporting laboratory.  This laboratory is certified under the Clinical Laboratory Improvement Amendments CLIA as qualified to perform high complexity clinical laboratory testing.    CIPROFLOXACIN  Value in next row Sensitive      4 SENSITIVEThis is a modified FDA-approved test that has been validated and its performance characteristics determined by the reporting laboratory.  This laboratory is certified under the Clinical Laboratory Improvement Amendments CLIA as qualified to perform high complexity clinical laboratory testing.    GENTAMICIN Value in next row Sensitive      4 SENSITIVEThis is a modified FDA-approved test that has been validated and its performance characteristics determined by the reporting laboratory.  This laboratory is certified under the Clinical Laboratory Improvement Amendments CLIA as qualified to perform high complexity clinical laboratory testing.    NITROFURANTOIN Value in next row Resistant      4 SENSITIVEThis is a modified FDA-approved test that has been validated and its performance characteristics determined by the reporting laboratory.  This laboratory is  certified under the Clinical Laboratory Improvement Amendments CLIA as qualified to perform high complexity clinical laboratory testing.    TRIMETH/SULFA Value in next row Sensitive      4 SENSITIVEThis is a modified FDA-approved test that has been validated and its performance characteristics determined by the reporting laboratory.  This laboratory is certified under the Clinical Laboratory Improvement Amendments CLIA as qualified to perform high complexity clinical laboratory testing.    AMPICILLIN/SULBACTAM Value in next row Sensitive      4 SENSITIVEThis is a modified FDA-approved test that has been validated  and its performance characteristics determined by the reporting laboratory.  This laboratory is certified under the Clinical Laboratory Improvement Amendments CLIA as qualified to perform high complexity clinical laboratory testing.    PIP/TAZO Value in next row Sensitive      <=4 SENSITIVEThis is a modified FDA-approved test that has been validated and its performance characteristics determined by the reporting laboratory.  This laboratory is certified under the Clinical Laboratory Improvement Amendments CLIA as qualified to perform high complexity clinical laboratory testing.    MEROPENEM Value in next row Sensitive      <=4 SENSITIVEThis is a modified FDA-approved test that has been validated and its performance characteristics determined by the reporting laboratory.  This laboratory is certified under the Clinical Laboratory Improvement Amendments CLIA as qualified to perform high complexity clinical laboratory testing.    * >=100,000 COLONIES/mL PROTEUS MIRABILIS  Culture, blood (Routine X 2) w Reflex to ID Panel     Status: None (Preliminary result)   Collection Time: 04/11/24  8:39 AM   Specimen: Right Antecubital; Blood  Result Value Ref Range Status   Specimen Description RIGHT ANTECUBITAL  Final   Special Requests   Final    BOTTLES DRAWN AEROBIC AND ANAEROBIC Blood Culture  adequate volume   Culture   Final    NO GROWTH 2 DAYS Performed at [facility], [address], [place], [place] [address]    Report Status PENDING  Incomplete  Culture, blood (Routine X 2) w Reflex to ID Panel     Status: None (Preliminary result)   Collection Time: 04/11/24  8:39 AM   Specimen: BLOOD RIGHT HAND  Result Value Ref Range Status   Specimen Description BLOOD RIGHT HAND  Final   Special Requests   Final    BOTTLES DRAWN AEROBIC AND ANAEROBIC Blood Culture adequate volume   Culture   Final    NO GROWTH 2 DAYS Performed at [facility], [address], [place], [place] [address]    Report Status PENDING  Incomplete     Time coordinating discharge: 32 min   SIGNED: Timeka Goette, DO [facility] 04/13/2024, 1:47 PM Pager   If 7PM-7AM, please contact night-coverage

## 2024-04-13 NOTE — Progress Notes (Signed)
 Patient is alert and oriented X 4. Discharge instruction given . No any questions at this time.

## 2024-04-15 ENCOUNTER — Encounter: Payer: Self-pay | Admitting: Oncology

## 2024-04-15 ENCOUNTER — Inpatient Hospital Stay: Attending: Internal Medicine

## 2024-04-15 ENCOUNTER — Inpatient Hospital Stay: Admitting: Hospice and Palliative Medicine

## 2024-04-15 ENCOUNTER — Inpatient Hospital Stay

## 2024-04-15 ENCOUNTER — Inpatient Hospital Stay (HOSPITAL_BASED_OUTPATIENT_CLINIC_OR_DEPARTMENT_OTHER): Admitting: Oncology

## 2024-04-15 VITALS — BP 110/58 | HR 84 | Temp 97.0°F | Resp 16 | Ht 70.0 in | Wt 127.0 lb

## 2024-04-15 DIAGNOSIS — N289 Disorder of kidney and ureter, unspecified: Secondary | ICD-10-CM | POA: Diagnosis present

## 2024-04-15 DIAGNOSIS — C679 Malignant neoplasm of bladder, unspecified: Secondary | ICD-10-CM | POA: Diagnosis not present

## 2024-04-15 DIAGNOSIS — C7951 Secondary malignant neoplasm of bone: Secondary | ICD-10-CM | POA: Insufficient documentation

## 2024-04-15 DIAGNOSIS — C787 Secondary malignant neoplasm of liver and intrahepatic bile duct: Secondary | ICD-10-CM | POA: Insufficient documentation

## 2024-04-15 DIAGNOSIS — Z66 Do not resuscitate: Secondary | ICD-10-CM | POA: Insufficient documentation

## 2024-04-15 DIAGNOSIS — R634 Abnormal weight loss: Secondary | ICD-10-CM | POA: Insufficient documentation

## 2024-04-15 DIAGNOSIS — F17210 Nicotine dependence, cigarettes, uncomplicated: Secondary | ICD-10-CM | POA: Insufficient documentation

## 2024-04-15 LAB — CMP (CANCER CENTER ONLY)
ALT: 37 U/L (ref 0–44)
AST: 75 U/L — ABNORMAL HIGH (ref 15–41)
Albumin: 2.2 g/dL — ABNORMAL LOW (ref 3.5–5.0)
Alkaline Phosphatase: 1747 U/L — ABNORMAL HIGH (ref 38–126)
Anion gap: 12 (ref 5–15)
BUN: 35 mg/dL — ABNORMAL HIGH (ref 8–23)
CO2: 22 mmol/L (ref 22–32)
Calcium: 8.1 mg/dL — ABNORMAL LOW (ref 8.9–10.3)
Chloride: 102 mmol/L (ref 98–111)
Creatinine: 1.97 mg/dL — ABNORMAL HIGH (ref 0.61–1.24)
GFR, Estimated: 37 mL/min — ABNORMAL LOW (ref >60)
Glucose, Bld: 118 mg/dL — ABNORMAL HIGH (ref 70–99)
Potassium: 4 mmol/L (ref 3.5–5.1)
Sodium: 136 mmol/L (ref 135–145)
Total Bilirubin: 0.9 mg/dL (ref 0.0–1.2)
Total Protein: 5.6 g/dL — ABNORMAL LOW (ref 6.5–8.1)

## 2024-04-15 LAB — CBC WITH DIFFERENTIAL (CANCER CENTER ONLY)
Abs Immature Granulocytes: 1.55 K/uL — ABNORMAL HIGH (ref 0.00–0.07)
Basophils Absolute: 0.1 K/uL (ref 0.0–0.1)
Basophils Relative: 1 %
Eosinophils Absolute: 0.1 K/uL (ref 0.0–0.5)
Eosinophils Relative: 1 %
HCT: 26.1 % — ABNORMAL LOW (ref 39.0–52.0)
Hemoglobin: 8.4 g/dL — ABNORMAL LOW (ref 13.0–17.0)
Immature Granulocytes: 16 %
Lymphocytes Relative: 10 %
Lymphs Abs: 1 K/uL (ref 0.7–4.0)
MCH: 28.9 pg (ref 26.0–34.0)
MCHC: 32.2 g/dL (ref 30.0–36.0)
MCV: 89.7 fL (ref 80.0–100.0)
Monocytes Absolute: 0.5 K/uL (ref 0.1–1.0)
Monocytes Relative: 5 %
Neutro Abs: 6.8 K/uL (ref 1.7–7.7)
Neutrophils Relative %: 67 %
Platelet Count: 27 K/uL — ABNORMAL LOW (ref 150–400)
RBC: 2.91 MIL/uL — ABNORMAL LOW (ref 4.22–5.81)
RDW: 19 % — ABNORMAL HIGH (ref 11.5–15.5)
Smear Review: NORMAL
WBC Count: 10 K/uL (ref 4.0–10.5)
nRBC: 5.9 % — ABNORMAL HIGH (ref 0.0–0.2)

## 2024-04-15 LAB — MAGNESIUM: Magnesium: 2.3 mg/dL (ref 1.7–2.4)

## 2024-04-15 MED ORDER — OXYCODONE HCL 5 MG PO TABS: 5.0000 mg | ORAL_TABLET | Freq: Four times a day (QID) | ORAL | 0 refills | Status: DC | PRN

## 2024-04-15 MED ORDER — SODIUM CHLORIDE 0.9 % IV SOLN
Freq: Once | INTRAVENOUS | Status: AC
  Filled 2024-04-15: qty 250

## 2024-04-15 NOTE — Progress Notes (Signed)
 [facility]  [phone] [phone]  ID: Ryan Dixon OB: 1955-11-13  MR#: [id number]  RDW#:[id number]  Patient Care Team: [name], MD as PCP - General (Family Medicine) [name], MD as Consulting Physician (Oncology)  CHIEF COMPLAINT: Progressive stage IV high-grade urothelial carcinoma.  INTERVAL HISTORY: Patient returns to clinic today for hospital follow-up and further evaluation.  He continues to have significant weakness and fatigue and a poor appetite.  His wife reports he has not had any episodes of confusion since Saturday.  He otherwise feels well.  He does not complain of pain today.  He has no neurologic complaints.  He denies any recent fevers or illnesses.  He has no chest pain, cough, or hemoptysis.  He admits to dyspnea on exertion.  He denies any abdominal pain.  He denies any nausea, vomiting, constipation, or diarrhea.  He has no urinary complaints.  Patient offers no further specific complaints today.    REVIEW OF SYSTEMS:   Review of Systems  Constitutional:  Positive for malaise/fatigue and weight loss. Negative for fever.  HENT:  Negative for nosebleeds.   Respiratory: Negative.  Negative for cough, hemoptysis and shortness of breath.   Cardiovascular: Negative.  Negative for chest pain and leg swelling.  Gastrointestinal: Negative.  Negative for abdominal pain and melena.  Genitourinary: Negative.  Negative for dysuria.  Musculoskeletal:  Positive for joint pain. Negative for back pain and myalgias.  Skin: Negative.  Negative for itching and rash.  Neurological:  Positive for weakness. Negative for dizziness, focal weakness and headaches.  Psychiatric/Behavioral: Negative.  The patient is not nervous/anxious.     As per HPI. Otherwise, a complete review of systems is negative.  PAST MEDICAL HISTORY: Past Medical History:  Diagnosis Date   Aortic atherosclerosis    Chronic foot pain, left    History  of hematuria    Hypertension    Other emphysema (HCC)    Other male erectile dysfunction    Pre-diabetes    Pure hypercholesterolemia    Tobacco dependence     PAST SURGICAL HISTORY: Past Surgical History:  Procedure Laterality Date   ANKLE ARTHROSCOPY Left 08/11/2020   Procedure: LEFT ANKLE ARTHROSCOPY AND DEBRIDEMENT;  Surgeon: [name], MD;  Location: [facility];  Service: Orthopedics;  Laterality: Left;   BLADDER INSTILLATION N/A 11/29/2022   Procedure: BLADDER INSTILLATION OF GEMCITABINE ;  Surgeon: [name], MD;  Location: [facility];  Service: Urology;  Laterality: N/A;   COLONOSCOPY     CYSTOSCOPY WITH INJECTION N/A 07/07/2023   Procedure: CYSTOSCOPY WITH INJECTION;  Surgeon: [name], MD;  Location: [facility];  Service: Urology;  Laterality: N/A;   IR IMAGING GUIDED PORT INSERTION  01/20/2023   PELVIC LYMPH NODE DISSECTION N/A 07/07/2023   Procedure: PELVIC LYMPH NODE DISSECTION;  Surgeon: [name], MD;  Location: [facility];  Service: Urology;  Laterality: N/A;   ROBOT ASSISTED LAPAROSCOPIC COMPLETE CYSTECT ILEAL CONDUIT N/A 07/07/2023   Procedure: XI ROBOTIC ASSISTED LAPAROSCOPIC COMPLETE CYSTECT ILEAL CONDUIT, UMBILICAL HERNIA REPAIR, BILATERAL INGUINAL HERNIA REPAIR;  Surgeon: [name], MD;  Location: [facility];  Service: Urology;  Laterality: N/A;   ROBOT ASSISTED LAPAROSCOPIC RADICAL PROSTATECTOMY N/A 07/07/2023   Procedure: XI ROBOTIC ASSISTED LAPAROSCOPIC RADICAL PROSTATECTOMY;  Surgeon: [name], MD;  Location: [facility];  Service: Urology;  Laterality: N/A;   TONSILLECTOMY     TRANSURETHRAL RESECTION OF BLADDER TUMOR N/A 11/29/2022  Procedure: TRANSURETHRAL RESECTION OF BLADDER TUMOR (TURBT);  Surgeon: [name], MD;  Location: [facility];  Service: Urology;  Laterality: N/A;    FAMILY HISTORY: History reviewed. No pertinent family history.  ADVANCED DIRECTIVES (Y/N):  N  HEALTH MAINTENANCE: Social  History   Tobacco Use   Smoking status: Every Day    Current packs/day: 2.00    Average packs/day: 2.0 packs/day for 43.0 years (86.0 ttl pk-yrs)    Types: Cigarettes   Smokeless tobacco: Never  Substance Use Topics   Alcohol use: Yes    Comment: 1-2 beers/day   Drug use: Never     Colonoscopy:  PAP:  Bone density:  Lipid panel:  Allergies  Allergen Reactions   Nsaids Other (See Comments)    GI upset   Other Other (See Comments)    Anti-inflammatories cause GI upset, N/V    Current Outpatient Medications  Medication Sig Dispense Refill   albuterol  (VENTOLIN  HFA) 108 (90 Base) MCG/ACT inhaler Inhale 2 puffs into the lungs every 6 (six) hours as needed for wheezing or shortness of breath. 8 g 2   cefpodoxime (VANTIN) 200 MG tablet Take 1 tablet (200 mg total) by mouth 2 (two) times daily for 5 days. 10 tablet 0   megestrol (MEGACE) 40 MG tablet Take 1 tablet (40 mg total) by mouth daily. 30 tablet 3   midodrine (PROAMATINE) 5 MG tablet Take 1 tablet (5 mg total) by mouth 3 (three) times daily with meals. 90 tablet 0   mirtazapine (REMERON) 7.5 MG tablet Take 1 tablet (7.5 mg total) by mouth at bedtime. 30 tablet 0   oxyCODONE  (OXY IR/ROXICODONE ) 5 MG immediate release tablet Take 1 tablet (5 mg total) by mouth every 6 (six) hours as needed for severe pain (pain score 7-10). 30 tablet 0   No current facility-administered medications for this visit.   Facility-Administered Medications Ordered in Other Visits  Medication Dose Route Frequency Provider Last Rate Last Admin   0.9 %  sodium chloride  infusion   Intravenous Once Jacobo, Cardarius Senat J, MD       heparin  lock flush 100 unit/mL  500 Units Intravenous Once Agrawal, Kavita, MD        OBJECTIVE: Vitals:   04/15/24 0938  BP: (!) 110/58  Pulse: 84  Resp: 16  Temp: (!) 97 F (36.1 C)  SpO2: 95%      Body mass index is 18.22 kg/m.    ECOG FS:2 - Symptomatic, <50% confined to bed  General: Well-developed,  well-nourished, no acute distress. Eyes: Pink conjunctiva, anicteric sclera. HEENT: Normocephalic, moist mucous membranes. Lungs: No audible wheezing or coughing. Heart: Regular rate and rhythm. Abdomen: Soft, nontender, no obvious distention. Musculoskeletal: No edema, cyanosis, or clubbing. Neuro: Alert, answering all questions appropriately. Cranial nerves grossly intact. Skin: No rashes or petechiae noted. Psych: Normal affect.  LAB RESULTS:  Lab Results  Component Value Date   NA 136 04/15/2024   K 4.0 04/15/2024   CL 102 04/15/2024   CO2 22 04/15/2024   GLUCOSE 118 (H) 04/15/2024   BUN 35 (H) 04/15/2024   CREATININE 1.97 (H) 04/15/2024   CALCIUM  8.1 (L) 04/15/2024   PROT 5.6 (L) 04/15/2024   ALBUMIN  2.2 (L) 04/15/2024   AST 75 (H) 04/15/2024   ALT 37 04/15/2024   ALKPHOS 1,747 (H) 04/15/2024   BILITOT 0.9 04/15/2024   GFRNONAA 37 (L) 04/15/2024    Lab Results  Component Value Date   WBC 10.0 04/15/2024   NEUTROABS 6.8  04/15/2024   HGB 8.4 (L) 04/15/2024   HCT 26.1 (L) 04/15/2024   MCV 89.7 04/15/2024   PLT 27 (L) 04/15/2024     STUDIES: MR BRAIN WO CONTRAST Result Date: 04/12/2024 EXAM: MRI BRAIN WITHOUT CONTRAST 04/12/2024 02:20:00 PM TECHNIQUE: Multiplanar multisequence MRI of the head/brain was performed without the administration of intravenous contrast. COMPARISON: None available. CLINICAL HISTORY: Metastatic disease evaluation; Has metastatic bladder CA, concern to have spread to the brain. Has CKD, cannot tolerate contrast. FINDINGS: BRAIN AND VENTRICLES: Multifocal hyperintense T2-weighted signal within the cerebral white matter, most commonly due to chronic small vessel disease. No acute infarct. No intracranial hemorrhage. No mass. No midline shift. No hydrocephalus. No cerebral edema. Assessment for metastases otherwise limited by lack of intravenous contrast agent. The sella is unremarkable. Normal flow voids. ORBITS: No acute abnormality. SINUSES AND  MASTOIDS: Bilateral mastoid effusions. Nasopharynx is clear. BONES AND SOFT TISSUES: Normal marrow signal. No acute soft tissue abnormality. IMPRESSION: 1. No acute intracranial abnormality. 2. Multifocal T2 white matter hyperintensities, most consistent with chronic small vessel disease. 3. Bilateral mastoid effusions. 4. Evaluation for metastases is limited without intravenous contrast. Electronically signed by: Franky Stanford MD 04/12/2024 03:07 PM EDT RP Workstation: [id number]   CT CHEST ABDOMEN PELVIS WO CONTRAST Result Date: 04/10/2024 CLINICAL DATA:  Metastatic disease evaluation. Malignant neoplasm of urinary bladder. EXAM: CT CHEST, ABDOMEN AND PELVIS WITHOUT CONTRAST TECHNIQUE: Multidetector CT imaging of the chest, abdomen and pelvis was performed following the standard protocol without IV contrast. RADIATION DOSE REDUCTION: This exam was performed according to the departmental dose-optimization program which includes automated exposure control, adjustment of the mA and/or kV according to patient size and/or use of iterative reconstruction technique. COMPARISON:  CT dated 01/26/2024. FINDINGS: Evaluation of this exam is limited in the absence of intravenous contrast. CT CHEST FINDINGS Cardiovascular: There is no cardiomegaly or pericardial effusion. Three-vessel coronary vascular calcification. The ascending aorta is dilated measuring up to 4.2 cm. There is mild atherosclerotic calcification of the thoracic aorta. The central pulmonary arteries are grossly unremarkable. Mediastinum/Nodes: No hilar or mediastinal adenopathy. The esophagus is grossly unremarkable. No mediastinal fluid collection. Right-sided Port-A-Cath with tip in the region of the cavoatrial junction. Lungs/Pleura: Background of emphysema. Small bilateral pleural effusions. No consolidative changes or pneumothorax. Several small scattered calcified granuloma. The central airways are patent. Musculoskeletal: Degenerative changes of the  spine. Extensive diffuse heterogeneous and predominantly sclerotic changes of the skeleton significantly progressed since the prior CT consistent with metastatic disease. CT ABDOMEN PELVIS FINDINGS No intra-abdominal free air.  Small ascites. Hepatobiliary: Innumerable hepatic hypodense lesions, significantly progressed since the prior CT consistent with progression of hepatic metastasis. Evaluation is limited in the absence of intravenous contrast. No biliary dilatation. The gallbladder is unremarkable. Pancreas: Unremarkable. No pancreatic ductal dilatation or surrounding inflammatory changes. Spleen: Normal in size without focal abnormality. Adrenals/Urinary Tract: The adrenal glands unremarkable. There is no hydronephrosis or nephrolithiasis on either side. Mild bilateral hydroureter. Status post cystectomy and right lower quadrant ileal conduit. Stomach/Bowel: There is no bowel obstruction or active inflammation. Postsurgical changes with bowel. No evidence of acute appendicitis. Vascular/Lymphatic: Advanced aortoiliac atherosclerotic disease. There is a 2.7 cm infrarenal aortic ectasia. The IVC is unremarkable. No portal venous gas. There is no adenopathy. Reproductive: Prostatectomy. Other: None Musculoskeletal: Extensive osteosclerosis consistent with metastatic disease. No acute osseous pathology. IMPRESSION: 1. Interval progression of hepatic and osseous metastatic disease. 2. Status post cystectomy and right lower quadrant ileal conduit. Mild bilateral hydroureter. No hydronephrosis. 3. Small  bilateral pleural effusions. 4. No bowel obstruction. 5.  Aortic Atherosclerosis (ICD10-I70.0). Electronically Signed   By: Vanetta Chou M.D.   On: 04/10/2024 16:21   DG Chest Port 1 View Result Date: 04/07/2024 CLINICAL DATA:  Bladder cancer, weakness. EXAM: PORTABLE CHEST 1 VIEW COMPARISON:  12/28/2023 and CT chest 01/26/2024. FINDINGS: Trachea is midline. Heart size normal. Right IJ power port tip is in  the SVC. Nodular density is seen in the right upper lobe, superimposed with the chest port. Calcified granuloma in the right lower lobe. Lungs are otherwise clear. No pleural fluid. IMPRESSION: 1. New right upper lobe nodule, worrisome for metastatic disease. 2. Otherwise, no acute findings. Electronically Signed   By: [name] Eke M.D.   On: 04/07/2024 14:59   CT Head Wo Contrast Result Date: 04/07/2024 EXAM: CT HEAD WITHOUT CONTRAST 04/07/2024 02:38:54 PM TECHNIQUE: CT of the head was performed without the administration of intravenous contrast. Automated exposure control, iterative reconstruction, and/or weight based adjustment of the mA/kV was utilized to reduce the radiation dose to as low as reasonably achievable. COMPARISON: None available. CLINICAL HISTORY: Mental status change, unknown cause. Pt has stage four bladder cancer and is currently taking chemo treatments. Per wife pt has been talking gibberish since Friday. FINDINGS: BRAIN AND VENTRICLES: No acute hemorrhage. No evidence of acute infarct. No hydrocephalus. No extra-axial collection. No mass effect or midline shift. ORBITS: No acute abnormality. SINUSES: No acute abnormality. SOFT TISSUES AND SKULL: No acute soft tissue abnormality. No skull fracture. IMPRESSION: 1. No acute intracranial abnormality. Electronically signed by: Gilmore Molt MD 04/07/2024 02:45 PM EDT RP Workstation: [id number]    ONCOLOGY HISTORY:  Patient was initially stage IIIb and received neoadjuvant chemotherapy with dose dense MVAC completing treatment on April 05, 2023.  He subsequently underwent cystoprostatectomy on July 07, 2023.  Patient was noted to have residual disease and 2 of 14 lymph nodes positive for malignancy.  He started on adjuvant nivolumab  with plans to do 1 year of maintenance treatment, but developed grade 3 rash.  Per dermatology, rash was related to an exacerbation of psoriasis and not a reaction to his immunotherapy.  Rash resolved  with topical treatment and systemic steroids.  Despite this, patient elected to not to reinitiate maintenance treatment.  Imaging on Oct 23, 2023 with no obvious evidence of recurrent or progressive disease.   ASSESSMENT: Progressive stage IV high-grade urothelial carcinoma.  PLAN:    Progressive stage IV high-grade urothelial carcinoma: See oncology history as above.  CT scan results from April 10, 2024 reviewed independently with significant progression of patient's disease in his liver and bones.  Will discontinue Keytruda  at this time.  Given his significantly reduced platelet count, there are no other treatment options and patient has agreed to hospice care.  His goal is to make it to Christmas.  He continues to have good days and bad days and hopes to occasionally go into work if he feels up to it.  He also has requested IV fluids on occasion if necessary.  No further intervention is needed.  No follow-up has been scheduled.  Appreciate palliative care input. Pain: Continue tramadol  100 mg as needed. Poor appetite/weight loss: Patient was recently given a prescription for Megace. Hyponatremia: Resolved. Renal insufficiency: Creatinine trending up to 1.97.  IV fluids today and as needed as above. Leukocytosis: Resolved. Anemia: Chronic and unchanged.  Patient's hemoglobin is 8.4.   Thrombocytopenia: Platelet count improved to 27 with transfusion in the hospital.  No further treatments as  above. Psoriasis exacerbation: No further appointments with dermatology are necessary. 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  Patient expressed understanding and was in agreement with this plan. He also understands that He can call clinic at any time with any questions, concerns, or complaints.    Cancer Staging  Bladder cancer [facility]) Staging form: Urinary Bladder, AJCC 8th Edition - Clinical stage from 12/07/2022: Stage II (cT2, cN0, cM0) - Signed by Agrawal, Kavita, MD on  01/02/2023 Stage prefix: Initial diagnosis WHO/ISUP grade (low/high): High Grade Histologic grading system: 2 grade system - Pathologic stage from 07/07/2023: Stage IIIB (ypT2a, ypN2, cM0) - Signed by Agrawal, Kavita, MD on 08/31/2023 Stage prefix: Post-therapy Response to neoadjuvant therapy: Partial response WHO/ISUP grade (low/high): High Grade Histologic grading system: 2 grade system   Evalene JINNY Reusing, MD   04/15/2024 10:53 AM

## 2024-04-15 NOTE — Progress Notes (Signed)
 Patient is still feeling achy, he is taking his pain medication twice a day. He is eating a little bit better. The hospital put him on a blood pressure pill, and since he has been feeling a little more dizzy than normal.

## 2024-04-15 NOTE — Progress Notes (Signed)
 Palliative Medicine [facility] [phone] [phone]   Name: PENG THORSTENSON Date: 04/15/2024 MRN: [id number]  DOB: 1955/07/24  Patient Care Team: [name], MD as PCP - General (Family Medicine) [name], MD as Consulting Physician (Oncology)    REASON FOR CONSULTATION: Ryan Dixon is a 68 y.o. 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  Palliative care was consulted to address goals.  SOCIAL HISTORY:     reports that he has been smoking cigarettes. He has a 86 pack-year smoking history. He has never used smokeless tobacco. He reports current alcohol use. He reports that he does not use drugs.  Patient is married and lives at home with his wife.  He still works for [employer].   ADVANCE DIRECTIVES:  Not on file  CODE STATUS: DNR/DNI (DNR order signed 04/15/2024)  PAST MEDICAL HISTORY: Past Medical History:  Diagnosis Date   Aortic atherosclerosis    Chronic foot pain, left    History of hematuria    Hypertension    Other emphysema (HCC)    Other male erectile dysfunction    Pre-diabetes    Pure hypercholesterolemia    Tobacco dependence     PAST SURGICAL HISTORY:  Past Surgical History:  Procedure Laterality Date   ANKLE ARTHROSCOPY Left 08/11/2020   Procedure: LEFT ANKLE ARTHROSCOPY AND DEBRIDEMENT;  Surgeon: [name], MD;  Location: [facility];  Service: Orthopedics;  Laterality: Left;   BLADDER INSTILLATION N/A 11/29/2022   Procedure: BLADDER INSTILLATION OF GEMCITABINE ;  Surgeon: [name], MD;  Location: [facility];  Service: Urology;  Laterality: N/A;   COLONOSCOPY     CYSTOSCOPY WITH INJECTION N/A 07/07/2023   Procedure: CYSTOSCOPY WITH INJECTION;  Surgeon: [name], MD;  Location: [facility];  Service: Urology;  Laterality:  N/A;   IR IMAGING GUIDED PORT INSERTION  01/20/2023   PELVIC LYMPH NODE DISSECTION N/A 07/07/2023   Procedure: PELVIC LYMPH NODE DISSECTION;  Surgeon: [name], MD;  Location: [facility];  Service: Urology;  Laterality: N/A;   ROBOT ASSISTED LAPAROSCOPIC COMPLETE CYSTECT ILEAL CONDUIT N/A 07/07/2023   Procedure: XI ROBOTIC ASSISTED LAPAROSCOPIC COMPLETE CYSTECT ILEAL CONDUIT, UMBILICAL HERNIA REPAIR, BILATERAL INGUINAL HERNIA REPAIR;  Surgeon: [name], MD;  Location: [facility];  Service: Urology;  Laterality: N/A;   ROBOT ASSISTED LAPAROSCOPIC RADICAL PROSTATECTOMY N/A 07/07/2023   Procedure: XI ROBOTIC ASSISTED LAPAROSCOPIC RADICAL PROSTATECTOMY;  Surgeon: [name], MD;  Location: [facility];  Service: Urology;  Laterality: N/A;   TONSILLECTOMY     TRANSURETHRAL RESECTION OF BLADDER TUMOR N/A 11/29/2022   Procedure: TRANSURETHRAL RESECTION OF BLADDER TUMOR (TURBT);  Surgeon: [name], MD;  Location: [facility];  Service: Urology;  Laterality: N/A;    HEMATOLOGY/ONCOLOGY HISTORY:  Oncology History  Bladder cancer (HCC)  10/25/2022 Initial Diagnosis   Seen by Dr. Twylla for intermittent hematuria since February 2024 along with other urinary symptoms.   10/28/2022 Imaging   CT hematuria workup Showed 2.2 cm bladder mass worrisome for cancer.  Adjacent mild diffuse bladder wall thickening.  Based on the location this may involve the extreme distal aspect of the right UVJ.  Extensive atherosclerotic changes with the occlusion of the right common iliac artery and areas of significant stenosis along the left. Please correlate with any symptoms such as claudication and  further workup as clinically appropriate.  CT chest with contrast IMPRESSION: 1. Lung-RADS 2, benign appearance or behavior. Continue annual screening with low-dose chest CT without contrast in 12 months. 2. Similar ascending aortic dilatation at 4.2 cm. Recommend attention on follow-up lung cancer  screening CT in 1 year.   11/29/2022 Procedure   Intraoperative findings:  Cystoscopy-urethra normal in caliber without stricture.  Prostate with mild lateral lobe enlargement and moderate bladder neck elevation.  UOs normal-appearing bilaterally.  The right UO was anterior to the bladder tumor Bladder tumor: 3 cm papillary/nodular tumor superior to right UO and extending laterally; ~ 1 cm papillary tumor anterior bladder neck 10:00; 2-1 cm papillary tumors anterior bladder neck 2:00    11/29/2022 Pathology Results        12/07/2022 Cancer Staging   Staging form: Urinary Bladder, AJCC 8th Edition - Clinical stage from 12/07/2022: Stage II (cT2, cN0, cM0) - Signed by Clista Bimler, MD on 01/02/2023 Stage prefix: Initial diagnosis WHO/ISUP grade (low/high): High Grade Histologic grading system: 2 grade system   01/02/2023 Initial Diagnosis   Bladder cancer (HCC)   01/23/2023 - 04/06/2023 Chemotherapy   Patient is on Treatment Plan : BLADDER DOSE DENSE MVAC q14d     07/07/2023 Cancer Staging   Staging form: Urinary Bladder, AJCC 8th Edition - Pathologic stage from 07/07/2023: Stage IIIB (ypT2a, pN2, cM0) - Signed by Clista Bimler, MD on 08/31/2023 Stage prefix: Post-therapy Response to neoadjuvant therapy: Partial response WHO/ISUP grade (low/high): High Grade Histologic grading system: 2 grade system   08/31/2023 - 09/26/2023 Chemotherapy   Patient is on Treatment Plan : BLADDER Nivolumab  (240) q14d     02/23/2024 - 03/15/2024 Chemotherapy   Patient is on Treatment Plan : UROTHELIAL ADVANCED, METASTATIC ENFORTUMAB D1, D8 + PEMBROLIZUMAB  (200) D1 Q21D     04/05/2024 - 04/05/2024 Chemotherapy   Patient is on Treatment Plan : BLADDER Pembrolizumab  (200) q21d       ALLERGIES:  is allergic to nsaids and other.  MEDICATIONS:  Current Outpatient Medications  Medication Sig Dispense Refill   oxyCODONE  (OXY IR/ROXICODONE ) 5 MG immediate release tablet Take 1 tablet (5 mg total) by mouth  every 6 (six) hours as needed for severe pain (pain score 7-10). 30 tablet 0   albuterol  (VENTOLIN  HFA) 108 (90 Base) MCG/ACT inhaler Inhale 2 puffs into the lungs every 6 (six) hours as needed for wheezing or shortness of breath. 8 g 2   cefpodoxime (VANTIN) 200 MG tablet Take 1 tablet (200 mg total) by mouth 2 (two) times daily for 5 days. 10 tablet 0   megestrol (MEGACE) 40 MG tablet Take 1 tablet (40 mg total) by mouth daily. 30 tablet 3   midodrine (PROAMATINE) 5 MG tablet Take 1 tablet (5 mg total) by mouth 3 (three) times daily with meals. 90 tablet 0   mirtazapine (REMERON) 7.5 MG tablet Take 1 tablet (7.5 mg total) by mouth at bedtime. 30 tablet 0   No current facility-administered medications for this visit.   Facility-Administered Medications Ordered in Other Visits  Medication Dose Route Frequency Provider Last Rate Last Admin   0.9 %  sodium chloride  infusion   Intravenous Once Finnegan, Timothy J, MD       heparin  lock flush 100 unit/mL  500 Units Intravenous Once Agrawal, Kavita, MD        VITAL SIGNS: There were no vitals taken for this visit. There were no vitals filed for this visit.  Estimated body mass index is 18.22 kg/m  as calculated from the following:   Height as of an earlier encounter on 04/15/24: 5' 10 (1.778 m).   Weight as of an earlier encounter on 04/15/24: 127 lb (57.6 kg).  LABS: CBC:    Component Value Date/Time   WBC 10.0 04/15/2024 0906   WBC 7.9 04/13/2024 0709   HGB 8.4 (L) 04/15/2024 0906   HCT 26.1 (L) 04/15/2024 0906   PLT 27 (L) 04/15/2024 0906   MCV 89.7 04/15/2024 0906   NEUTROABS 6.8 04/15/2024 0906   LYMPHSABS 1.0 04/15/2024 0906   MONOABS 0.5 04/15/2024 0906   EOSABS 0.1 04/15/2024 0906   BASOSABS 0.1 04/15/2024 0906   Comprehensive Metabolic Panel:    Component Value Date/Time   NA 136 04/15/2024 0906   K 4.0 04/15/2024 0906   CL 102 04/15/2024 0906   CO2 22 04/15/2024 0906   BUN 35 (H) 04/15/2024 0906   CREATININE 1.97 (H)  04/15/2024 0906   GLUCOSE 118 (H) 04/15/2024 0906   CALCIUM  8.1 (L) 04/15/2024 0906   AST 75 (H) 04/15/2024 0906   ALT 37 04/15/2024 0906   ALKPHOS 1,747 (H) 04/15/2024 0906   BILITOT 0.9 04/15/2024 0906   PROT 5.6 (L) 04/15/2024 0906   ALBUMIN  2.2 (L) 04/15/2024 0906    RADIOGRAPHIC STUDIES: MR BRAIN WO CONTRAST Result Date: 04/12/2024 EXAM: MRI BRAIN WITHOUT CONTRAST 04/12/2024 02:20:00 PM TECHNIQUE: Multiplanar multisequence MRI of the head/brain was performed without the administration of intravenous contrast. COMPARISON: None available. CLINICAL HISTORY: Metastatic disease evaluation; Has metastatic bladder CA, concern to have spread to the brain. Has CKD, cannot tolerate contrast. FINDINGS: BRAIN AND VENTRICLES: Multifocal hyperintense T2-weighted signal within the cerebral white matter, most commonly due to chronic small vessel disease. No acute infarct. No intracranial hemorrhage. No mass. No midline shift. No hydrocephalus. No cerebral edema. Assessment for metastases otherwise limited by lack of intravenous contrast agent. The sella is unremarkable. Normal flow voids. ORBITS: No acute abnormality. SINUSES AND MASTOIDS: Bilateral mastoid effusions. Nasopharynx is clear. BONES AND SOFT TISSUES: Normal marrow signal. No acute soft tissue abnormality. IMPRESSION: 1. No acute intracranial abnormality. 2. Multifocal T2 white matter hyperintensities, most consistent with chronic small vessel disease. 3. Bilateral mastoid effusions. 4. Evaluation for metastases is limited without intravenous contrast. Electronically signed by: Franky Stanford MD 04/12/2024 03:07 PM EDT RP Workstation: [id number]   CT CHEST ABDOMEN PELVIS WO CONTRAST Result Date: 04/10/2024 CLINICAL DATA:  Metastatic disease evaluation. Malignant neoplasm of urinary bladder. EXAM: CT CHEST, ABDOMEN AND PELVIS WITHOUT CONTRAST TECHNIQUE: Multidetector CT imaging of the chest, abdomen and pelvis was performed following the standard  protocol without IV contrast. RADIATION DOSE REDUCTION: This exam was performed according to the departmental dose-optimization program which includes automated exposure control, adjustment of the mA and/or kV according to patient size and/or use of iterative reconstruction technique. COMPARISON:  CT dated 01/26/2024. FINDINGS: Evaluation of this exam is limited in the absence of intravenous contrast. CT CHEST FINDINGS Cardiovascular: There is no cardiomegaly or pericardial effusion. Three-vessel coronary vascular calcification. The ascending aorta is dilated measuring up to 4.2 cm. There is mild atherosclerotic calcification of the thoracic aorta. The central pulmonary arteries are grossly unremarkable. Mediastinum/Nodes: No hilar or mediastinal adenopathy. The esophagus is grossly unremarkable. No mediastinal fluid collection. Right-sided Port-A-Cath with tip in the region of the cavoatrial junction. Lungs/Pleura: Background of emphysema. Small bilateral pleural effusions. No consolidative changes or pneumothorax. Several small scattered calcified granuloma. The central airways are patent. Musculoskeletal: Degenerative changes of the spine. Extensive diffuse heterogeneous  and predominantly sclerotic changes of the skeleton significantly progressed since the prior CT consistent with metastatic disease. CT ABDOMEN PELVIS FINDINGS No intra-abdominal free air.  Small ascites. Hepatobiliary: Innumerable hepatic hypodense lesions, significantly progressed since the prior CT consistent with progression of hepatic metastasis. Evaluation is limited in the absence of intravenous contrast. No biliary dilatation. The gallbladder is unremarkable. Pancreas: Unremarkable. No pancreatic ductal dilatation or surrounding inflammatory changes. Spleen: Normal in size without focal abnormality. Adrenals/Urinary Tract: The adrenal glands unremarkable. There is no hydronephrosis or nephrolithiasis on either side. Mild bilateral  hydroureter. Status post cystectomy and right lower quadrant ileal conduit. Stomach/Bowel: There is no bowel obstruction or active inflammation. Postsurgical changes with bowel. No evidence of acute appendicitis. Vascular/Lymphatic: Advanced aortoiliac atherosclerotic disease. There is a 2.7 cm infrarenal aortic ectasia. The IVC is unremarkable. No portal venous gas. There is no adenopathy. Reproductive: Prostatectomy. Other: None Musculoskeletal: Extensive osteosclerosis consistent with metastatic disease. No acute osseous pathology. IMPRESSION: 1. Interval progression of hepatic and osseous metastatic disease. 2. Status post cystectomy and right lower quadrant ileal conduit. Mild bilateral hydroureter. No hydronephrosis. 3. Small bilateral pleural effusions. 4. No bowel obstruction. 5.  Aortic Atherosclerosis (ICD10-I70.0). Electronically Signed   By: Vanetta Chou M.D.   On: 04/10/2024 16:21   DG Chest Port 1 View Result Date: 04/07/2024 CLINICAL DATA:  Bladder cancer, weakness. EXAM: PORTABLE CHEST 1 VIEW COMPARISON:  12/28/2023 and CT chest 01/26/2024. FINDINGS: Trachea is midline. Heart size normal. Right IJ power port tip is in the SVC. Nodular density is seen in the right upper lobe, superimposed with the chest port. Calcified granuloma in the right lower lobe. Lungs are otherwise clear. No pleural fluid. IMPRESSION: 1. New right upper lobe nodule, worrisome for metastatic disease. 2. Otherwise, no acute findings. Electronically Signed   By: [name] Eke M.D.   On: 04/07/2024 14:59   CT Head Wo Contrast Result Date: 04/07/2024 EXAM: CT HEAD WITHOUT CONTRAST 04/07/2024 02:38:54 PM TECHNIQUE: CT of the head was performed without the administration of intravenous contrast. Automated exposure control, iterative reconstruction, and/or weight based adjustment of the mA/kV was utilized to reduce the radiation dose to as low as reasonably achievable. COMPARISON: None available. CLINICAL HISTORY: Mental  status change, unknown cause. Pt has stage four bladder cancer and is currently taking chemo treatments. Per wife pt has been talking gibberish since Friday. FINDINGS: BRAIN AND VENTRICLES: No acute hemorrhage. No evidence of acute infarct. No hydrocephalus. No extra-axial collection. No mass effect or midline shift. ORBITS: No acute abnormality. SINUSES: No acute abnormality. SOFT TISSUES AND SKULL: No acute soft tissue abnormality. No skull fracture. IMPRESSION: 1. No acute intracranial abnormality. Electronically signed by: Gilmore Molt MD 04/07/2024 02:45 PM EDT RP Workstation: [id number]    PERFORMANCE STATUS (ECOG) : 2 - Symptomatic, <50% confined to bed  Review of Systems Unless otherwise noted, a complete review of systems is negative.  Physical Exam General: NAD Cardiovascular: regular rate and rhythm Pulmonary: clear ant fields Abdomen: soft, nontender, + bowel sounds GU: no suprapubic tenderness Extremities: no edema, no joint deformities Skin: no rashes Neurological: Weakness but otherwise nonfocal  IMPRESSION: Follow-up visit.  Patient status post recent hospitalization.  Was treated for bacteremia.  Unfortunately, CT showed disease progression.  Patient is not felt to have viable treatment options.  Hospice was recommended by medical oncology.  I met with patient and wife.  Daughter dissipated in the visit via phone.  Patient says that his goals are aligned with comfort and quality of  life.  He recognizes that his life expectancy is limited and is in agreement with hospice care at home.  Symptomatically, he endorses worse generalized pain.  Tramadol  has become ineffective.  Will rotate to oxycodone .  Recommended daily bowel regimen to prevent opioid-induced constipation.  Discussed CODE STATUS.  Patient states clearly and repeatedly that he would not be interested in life-prolonging measures including resuscitation or intubation.  He was in agreement DNR/DNI.  I signed a  DNR order for him to take home today.  PLAN: -Best supportive care -Referral to hospice -DNR/DNI -DC tramadol  -Start oxycodone  5 mg every 6 hours as needed #30 -Daily bowel regimen  Case and plan discussed with Dr. Jacobo  Patient expressed understanding and was in agreement with this plan. He also understands that He can call the clinic at any time with any questions, concerns, or complaints.     Time Total: 25 minutes  Visit consisted of counseling and education dealing with the complex and emotionally intense issues of symptom management and palliative care in the setting of serious and potentially life-threatening illness.Greater than 50%  of this time was spent counseling and coordinating care related to the above assessment and plan.  Signed by: Fonda Mower, PhD, NP-C

## 2024-04-16 ENCOUNTER — Telehealth: Payer: Self-pay

## 2024-04-16 ENCOUNTER — Inpatient Hospital Stay

## 2024-04-16 LAB — CULTURE, BLOOD (ROUTINE X 2)
Culture: NO GROWTH
Culture: NO GROWTH
Special Requests: ADEQUATE
Special Requests: ADEQUATE

## 2024-04-16 LAB — PREPARE PLATELET PHERESIS: Unit division: 0

## 2024-04-16 LAB — ABO/RH: ABO/RH(D): B POS

## 2024-04-16 LAB — BPAM PLATELET PHERESIS
Blood Product Expiration Date: 202511022359
ISSUE DATE / TIME: 202511011108
Unit Type and Rh: 6200

## 2024-04-16 NOTE — Telephone Encounter (Signed)
 Star with [facility], patient was referred to Hospice yesterday from [facility].  Patient wants to  know if Dr. Jacobo will be his Hospice provider and if MD feels pt life expectancy of <6 months.

## 2024-04-17 ENCOUNTER — Encounter: Payer: Self-pay | Admitting: Hospice and Palliative Medicine

## 2024-04-17 NOTE — Telephone Encounter (Signed)
 Yes, agree with increasing oxycodone  to 1-2 tablets (5-10mg ) every 4 hours as needed for pain.

## 2024-04-19 ENCOUNTER — Other Ambulatory Visit

## 2024-04-19 ENCOUNTER — Ambulatory Visit

## 2024-04-19 ENCOUNTER — Ambulatory Visit: Admitting: Oncology

## 2024-04-20 ENCOUNTER — Other Ambulatory Visit: Payer: Self-pay | Admitting: Hospice and Palliative Medicine

## 2024-04-26 ENCOUNTER — Inpatient Hospital Stay

## 2024-04-26 ENCOUNTER — Other Ambulatory Visit

## 2024-04-26 ENCOUNTER — Inpatient Hospital Stay: Admitting: Hospice and Palliative Medicine

## 2024-04-26 ENCOUNTER — Inpatient Hospital Stay: Attending: Internal Medicine | Admitting: Oncology

## 2024-04-26 ENCOUNTER — Ambulatory Visit

## 2024-04-26 ENCOUNTER — Ambulatory Visit: Admitting: Oncology

## 2024-05-01 ENCOUNTER — Telehealth: Payer: Self-pay

## 2024-05-01 MED ORDER — SULFAMETHOXAZOLE-TRIMETHOPRIM 800-160 MG PO TABS: 1.0000 | ORAL_TABLET | Freq: Two times a day (BID) | ORAL | 0 refills | Status: DC

## 2024-05-01 NOTE — Telephone Encounter (Signed)
 Antibiotics if you think appropriate.

## 2024-05-01 NOTE — Telephone Encounter (Signed)
 Melinda with [facility] calling to report patient has a decline in condition for the past few days.  Urine was checked showing positive for leukocytes and nitrites.

## 2024-05-01 NOTE — Telephone Encounter (Signed)
 Message left notifying [name] that antibiotic sent to pharmacy.

## 2024-05-02 MED ORDER — SULFAMETHOXAZOLE-TRIMETHOPRIM 800-160 MG PO TABS: 1.0000 | ORAL_TABLET | Freq: Two times a day (BID) | ORAL | 0 refills | Status: DC

## 2024-05-02 NOTE — Telephone Encounter (Signed)
 Rx on 05/01/24 did not go thru to pharmacy.  Resent with receipt confirmed on 05/02/24.

## 2024-05-02 NOTE — Addendum Note (Signed)
 Addended by: CLAUDENE POWELL CROME on: 05/02/2024 10:23 AM   Modules accepted: Orders

## 2024-05-03 ENCOUNTER — Ambulatory Visit

## 2024-05-03 ENCOUNTER — Ambulatory Visit: Admitting: Oncology

## 2024-05-03 ENCOUNTER — Other Ambulatory Visit

## 2024-05-13 DEATH — deceased

## 2024-05-17 ENCOUNTER — Inpatient Hospital Stay: Admitting: Oncology

## 2024-05-17 ENCOUNTER — Inpatient Hospital Stay: Admitting: Hospice and Palliative Medicine

## 2024-05-17 ENCOUNTER — Inpatient Hospital Stay

## 2024-05-17 ENCOUNTER — Ambulatory Visit

## 2024-05-17 ENCOUNTER — Ambulatory Visit: Admitting: Oncology

## 2024-05-17 ENCOUNTER — Other Ambulatory Visit

## 2024-05-24 ENCOUNTER — Other Ambulatory Visit (HOSPITAL_COMMUNITY): Payer: Self-pay

## 2024-06-10 ENCOUNTER — Inpatient Hospital Stay

## 2024-06-10 ENCOUNTER — Inpatient Hospital Stay: Admitting: Oncology

## 2024-06-28 ENCOUNTER — Inpatient Hospital Stay: Admitting: Oncology

## 2024-06-28 ENCOUNTER — Inpatient Hospital Stay

## 2024-06-29 ENCOUNTER — Other Ambulatory Visit: Payer: Self-pay | Admitting: Oncology
